# Patient Record
Sex: Male | Born: 1940 | Race: White | Hispanic: No | Marital: Married | Smoking: Current every day smoker
Health system: Southern US, Community
[De-identification: ages and names within clinical notes are randomized; demographics above are authoritative.]

## PROBLEM LIST (undated history)

## (undated) DIAGNOSIS — I10 Essential (primary) hypertension: Secondary | ICD-10-CM

## (undated) DIAGNOSIS — R972 Elevated prostate specific antigen [PSA]: Secondary | ICD-10-CM

## (undated) DIAGNOSIS — F32A Depression, unspecified: Secondary | ICD-10-CM

## (undated) DIAGNOSIS — G4730 Sleep apnea, unspecified: Secondary | ICD-10-CM

## (undated) DIAGNOSIS — Z87442 Personal history of urinary calculi: Secondary | ICD-10-CM

## (undated) DIAGNOSIS — K219 Gastro-esophageal reflux disease without esophagitis: Secondary | ICD-10-CM

## (undated) DIAGNOSIS — E119 Type 2 diabetes mellitus without complications: Secondary | ICD-10-CM

## (undated) DIAGNOSIS — F419 Anxiety disorder, unspecified: Secondary | ICD-10-CM

## (undated) DIAGNOSIS — F329 Major depressive disorder, single episode, unspecified: Secondary | ICD-10-CM

## (undated) DIAGNOSIS — C4490 Unspecified malignant neoplasm of skin, unspecified: Secondary | ICD-10-CM

## (undated) HISTORY — DX: Major depressive disorder, single episode, unspecified: F32.9

## (undated) HISTORY — PX: NECK SURGERY: SHX720

## (undated) HISTORY — DX: Depression, unspecified: F32.A

## (undated) HISTORY — PX: KNEE SURGERY: SHX244

## (undated) HISTORY — DX: Essential (primary) hypertension: I10

## (undated) HISTORY — PX: APPENDECTOMY: SHX54

## (undated) HISTORY — DX: Anxiety disorder, unspecified: F41.9

## (undated) HISTORY — DX: Sleep apnea, unspecified: G47.30

## (undated) HISTORY — DX: Type 2 diabetes mellitus without complications: E11.9

## (undated) HISTORY — PX: BACK SURGERY: SHX140

## (undated) HISTORY — PX: WRIST SURGERY: SHX841

## (undated) HISTORY — DX: Gastro-esophageal reflux disease without esophagitis: K21.9

## (undated) HISTORY — PX: SHOULDER SURGERY: SHX246

## (undated) HISTORY — DX: Elevated prostate specific antigen (PSA): R97.20

## (undated) HISTORY — DX: Personal history of urinary calculi: Z87.442

## (undated) HISTORY — PX: BURR HOLE FOR SUBDURAL HEMATOMA: SHX1275

## (undated) HISTORY — DX: Unspecified malignant neoplasm of skin, unspecified: C44.90

## (undated) HISTORY — PX: TONSILLECTOMY: SUR1361

---

## 2003-12-31 ENCOUNTER — Ambulatory Visit: Payer: Self-pay | Admitting: Unknown Physician Specialty

## 2003-12-31 ENCOUNTER — Other Ambulatory Visit: Payer: Self-pay

## 2004-01-06 ENCOUNTER — Ambulatory Visit: Payer: Self-pay | Admitting: Unknown Physician Specialty

## 2004-04-04 ENCOUNTER — Ambulatory Visit: Payer: Self-pay | Admitting: Physician Assistant

## 2004-07-19 ENCOUNTER — Emergency Department: Payer: Self-pay | Admitting: Emergency Medicine

## 2004-09-28 ENCOUNTER — Inpatient Hospital Stay: Payer: Self-pay | Admitting: Internal Medicine

## 2004-09-28 ENCOUNTER — Other Ambulatory Visit: Payer: Self-pay

## 2004-11-29 ENCOUNTER — Ambulatory Visit: Payer: Self-pay | Admitting: Internal Medicine

## 2004-12-06 ENCOUNTER — Ambulatory Visit: Payer: Self-pay | Admitting: Internal Medicine

## 2005-01-23 ENCOUNTER — Ambulatory Visit: Payer: Self-pay | Admitting: Internal Medicine

## 2006-01-23 ENCOUNTER — Ambulatory Visit: Payer: Self-pay | Admitting: Internal Medicine

## 2006-01-29 ENCOUNTER — Inpatient Hospital Stay: Payer: Self-pay | Admitting: Internal Medicine

## 2006-01-29 ENCOUNTER — Other Ambulatory Visit: Payer: Self-pay

## 2006-03-20 ENCOUNTER — Ambulatory Visit: Payer: Self-pay | Admitting: Gastroenterology

## 2006-03-22 ENCOUNTER — Ambulatory Visit: Payer: Self-pay | Admitting: Gastroenterology

## 2006-07-27 ENCOUNTER — Emergency Department: Payer: Self-pay | Admitting: Emergency Medicine

## 2006-08-11 ENCOUNTER — Ambulatory Visit: Payer: Self-pay | Admitting: Emergency Medicine

## 2007-05-03 ENCOUNTER — Ambulatory Visit: Payer: Self-pay | Admitting: Internal Medicine

## 2007-05-22 ENCOUNTER — Ambulatory Visit: Payer: Self-pay | Admitting: Family Medicine

## 2008-01-07 ENCOUNTER — Ambulatory Visit: Payer: Self-pay | Admitting: Internal Medicine

## 2008-04-06 ENCOUNTER — Ambulatory Visit: Payer: Self-pay | Admitting: Unknown Physician Specialty

## 2008-04-09 ENCOUNTER — Ambulatory Visit: Payer: Self-pay | Admitting: Unknown Physician Specialty

## 2008-04-10 ENCOUNTER — Ambulatory Visit: Payer: Self-pay | Admitting: Unknown Physician Specialty

## 2008-04-18 ENCOUNTER — Inpatient Hospital Stay: Payer: Self-pay | Admitting: Unknown Physician Specialty

## 2008-04-21 ENCOUNTER — Ambulatory Visit: Payer: Self-pay | Admitting: Unknown Physician Specialty

## 2008-05-07 ENCOUNTER — Ambulatory Visit: Payer: Self-pay | Admitting: Urology

## 2008-05-21 ENCOUNTER — Ambulatory Visit: Payer: Self-pay | Admitting: Unknown Physician Specialty

## 2008-08-03 ENCOUNTER — Ambulatory Visit: Payer: Self-pay | Admitting: Urology

## 2008-11-03 ENCOUNTER — Ambulatory Visit: Payer: Self-pay | Admitting: Urology

## 2008-11-10 ENCOUNTER — Ambulatory Visit: Payer: Self-pay | Admitting: Urology

## 2009-02-28 ENCOUNTER — Inpatient Hospital Stay: Payer: Self-pay | Admitting: Internal Medicine

## 2009-03-17 ENCOUNTER — Emergency Department: Payer: Self-pay | Admitting: Emergency Medicine

## 2009-05-03 ENCOUNTER — Ambulatory Visit: Payer: Self-pay | Admitting: Urology

## 2009-07-28 ENCOUNTER — Ambulatory Visit: Payer: Self-pay | Admitting: Unknown Physician Specialty

## 2009-08-24 ENCOUNTER — Ambulatory Visit: Payer: Self-pay | Admitting: Unknown Physician Specialty

## 2009-08-31 ENCOUNTER — Inpatient Hospital Stay: Payer: Self-pay | Admitting: Unknown Physician Specialty

## 2010-02-11 ENCOUNTER — Inpatient Hospital Stay: Payer: Self-pay | Admitting: Internal Medicine

## 2010-03-07 ENCOUNTER — Ambulatory Visit: Payer: Self-pay | Admitting: Internal Medicine

## 2010-06-27 ENCOUNTER — Ambulatory Visit: Payer: Self-pay | Admitting: Gastroenterology

## 2010-06-30 LAB — PATHOLOGY REPORT

## 2011-01-11 ENCOUNTER — Ambulatory Visit: Payer: Self-pay | Admitting: Surgery

## 2011-01-26 ENCOUNTER — Ambulatory Visit: Payer: Self-pay | Admitting: Unknown Physician Specialty

## 2011-02-02 ENCOUNTER — Ambulatory Visit: Payer: Self-pay | Admitting: Unknown Physician Specialty

## 2011-10-14 ENCOUNTER — Emergency Department: Payer: Self-pay | Admitting: Emergency Medicine

## 2011-10-14 LAB — CBC WITH DIFFERENTIAL/PLATELET
Eosinophil %: 1.4 %
Lymphocyte %: 33 %
Monocyte %: 8.5 %
Neutrophil %: 56.9 %
Platelet: 180 10*3/uL (ref 150–440)
RBC: 4.06 10*6/uL — ABNORMAL LOW (ref 4.40–5.90)
WBC: 10.6 10*3/uL (ref 3.8–10.6)

## 2011-10-14 LAB — BASIC METABOLIC PANEL
Anion Gap: 8 (ref 7–16)
Calcium, Total: 9.2 mg/dL (ref 8.5–10.1)
EGFR (African American): 43 — ABNORMAL LOW
EGFR (Non-African Amer.): 37 — ABNORMAL LOW
Glucose: 199 mg/dL — ABNORMAL HIGH (ref 65–99)
Osmolality: 281 (ref 275–301)
Sodium: 136 mmol/L (ref 136–145)

## 2012-01-22 ENCOUNTER — Emergency Department: Payer: Self-pay | Admitting: Emergency Medicine

## 2012-03-20 ENCOUNTER — Emergency Department: Payer: Self-pay | Admitting: Emergency Medicine

## 2012-07-23 ENCOUNTER — Emergency Department: Payer: Self-pay | Admitting: Internal Medicine

## 2012-07-23 LAB — CBC
HCT: 37.5 % — ABNORMAL LOW (ref 40.0–52.0)
HGB: 12.8 g/dL — ABNORMAL LOW (ref 13.0–18.0)
MCH: 30.4 pg (ref 26.0–34.0)
Platelet: 141 10*3/uL — ABNORMAL LOW (ref 150–440)
RBC: 4.2 10*6/uL — ABNORMAL LOW (ref 4.40–5.90)
WBC: 9.1 10*3/uL (ref 3.8–10.6)

## 2012-07-23 LAB — COMPREHENSIVE METABOLIC PANEL
Albumin: 3.7 g/dL (ref 3.4–5.0)
Alkaline Phosphatase: 66 U/L (ref 50–136)
Anion Gap: 7 (ref 7–16)
BUN: 18 mg/dL (ref 7–18)
Bilirubin,Total: 1 mg/dL (ref 0.2–1.0)
Calcium, Total: 8.8 mg/dL (ref 8.5–10.1)
Chloride: 101 mmol/L (ref 98–107)
EGFR (African American): >60
EGFR (Non-African Amer.): >60
Glucose: 133 mg/dL — ABNORMAL HIGH (ref 65–99)
Osmolality: 274 (ref 275–301)
Potassium: 4.5 mmol/L (ref 3.5–5.1)
SGPT (ALT): 27 U/L (ref 12–78)
Sodium: 135 mmol/L — ABNORMAL LOW (ref 136–145)
Total Protein: 6.9 g/dL (ref 6.4–8.2)

## 2012-09-01 DIAGNOSIS — R972 Elevated prostate specific antigen [PSA]: Secondary | ICD-10-CM | POA: Insufficient documentation

## 2012-09-01 DIAGNOSIS — N138 Other obstructive and reflux uropathy: Secondary | ICD-10-CM | POA: Insufficient documentation

## 2012-09-17 ENCOUNTER — Observation Stay: Payer: Self-pay | Admitting: Internal Medicine

## 2012-09-17 LAB — CK TOTAL AND CKMB (NOT AT ARMC)
CK, Total: 94 U/L (ref 35–232)
CK, Total: 130 U/L (ref 35–232)
CK-MB: 2.1 ng/mL (ref 0.5–3.6)
CK-MB: 3.4 ng/mL (ref 0.5–3.6)

## 2012-09-17 LAB — CBC
HCT: 38.2 % — ABNORMAL LOW (ref 40.0–52.0)
MCHC: 33.9 g/dL (ref 32.0–36.0)
MCV: 91 fL (ref 80–100)
Platelet: 163 10*3/uL (ref 150–440)
RBC: 4.21 10*6/uL — ABNORMAL LOW (ref 4.40–5.90)
RDW: 14.5 % (ref 11.5–14.5)
WBC: 11.8 10*3/uL — ABNORMAL HIGH (ref 3.8–10.6)

## 2012-09-17 LAB — BASIC METABOLIC PANEL
Anion Gap: 11 (ref 7–16)
Chloride: 100 mmol/L (ref 98–107)
Co2: 24 mmol/L (ref 21–32)
Creatinine: 1.35 mg/dL — ABNORMAL HIGH (ref 0.60–1.30)
EGFR (Non-African Amer.): 51 — ABNORMAL LOW
Sodium: 135 mmol/L — ABNORMAL LOW (ref 136–145)

## 2012-09-17 LAB — TROPONIN I: Troponin-I: <0.02 ng/mL

## 2013-03-16 ENCOUNTER — Ambulatory Visit: Payer: Self-pay | Admitting: Internal Medicine

## 2013-03-23 ENCOUNTER — Ambulatory Visit: Payer: Self-pay | Admitting: Family Medicine

## 2013-03-26 DIAGNOSIS — S065XAA Traumatic subdural hemorrhage with loss of consciousness status unknown, initial encounter: Secondary | ICD-10-CM | POA: Insufficient documentation

## 2013-03-26 DIAGNOSIS — S065X9A Traumatic subdural hemorrhage with loss of consciousness of unspecified duration, initial encounter: Secondary | ICD-10-CM | POA: Insufficient documentation

## 2013-07-24 DIAGNOSIS — K219 Gastro-esophageal reflux disease without esophagitis: Secondary | ICD-10-CM | POA: Insufficient documentation

## 2013-07-24 DIAGNOSIS — R5383 Other fatigue: Secondary | ICD-10-CM | POA: Insufficient documentation

## 2013-07-24 DIAGNOSIS — I2510 Atherosclerotic heart disease of native coronary artery without angina pectoris: Secondary | ICD-10-CM | POA: Insufficient documentation

## 2013-07-24 DIAGNOSIS — E782 Mixed hyperlipidemia: Secondary | ICD-10-CM | POA: Insufficient documentation

## 2013-08-13 DIAGNOSIS — N183 Chronic kidney disease, stage 3 unspecified: Secondary | ICD-10-CM | POA: Insufficient documentation

## 2013-08-13 DIAGNOSIS — E119 Type 2 diabetes mellitus without complications: Secondary | ICD-10-CM | POA: Insufficient documentation

## 2013-08-13 DIAGNOSIS — E538 Deficiency of other specified B group vitamins: Secondary | ICD-10-CM | POA: Insufficient documentation

## 2013-08-13 DIAGNOSIS — E162 Hypoglycemia, unspecified: Secondary | ICD-10-CM | POA: Insufficient documentation

## 2013-11-25 ENCOUNTER — Ambulatory Visit: Payer: Self-pay | Admitting: Family Medicine

## 2013-12-23 DIAGNOSIS — G250 Essential tremor: Secondary | ICD-10-CM | POA: Insufficient documentation

## 2014-01-18 ENCOUNTER — Ambulatory Visit: Payer: Self-pay | Admitting: Emergency Medicine

## 2014-01-18 ENCOUNTER — Emergency Department: Payer: Self-pay | Admitting: Emergency Medicine

## 2014-01-18 LAB — BASIC METABOLIC PANEL
ANION GAP: 12 (ref 7–16)
BUN: 22 mg/dL — AB (ref 7–18)
CHLORIDE: 100 mmol/L (ref 98–107)
CO2: 24 mmol/L (ref 21–32)
CREATININE: 1.6 mg/dL — AB (ref 0.60–1.30)
Calcium, Total: 8.6 mg/dL (ref 8.5–10.1)
EGFR (Non-African Amer.): 45 — ABNORMAL LOW
GFR CALC AF AMER: 55 — AB
GLUCOSE: 213 mg/dL — AB (ref 65–99)
Osmolality: 282 (ref 275–301)
Potassium: 4.1 mmol/L (ref 3.5–5.1)
Sodium: 136 mmol/L (ref 136–145)

## 2014-01-18 LAB — CBC
HCT: 37.1 % — AB (ref 40.0–52.0)
HGB: 12 g/dL — AB (ref 13.0–18.0)
MCH: 29.9 pg (ref 26.0–34.0)
MCHC: 32.4 g/dL (ref 32.0–36.0)
MCV: 92 fL (ref 80–100)
Platelet: 178 10*3/uL (ref 150–440)
RBC: 4.02 10*6/uL — AB (ref 4.40–5.90)
RDW: 16.7 % — AB (ref 11.5–14.5)
WBC: 8 10*3/uL (ref 3.8–10.6)

## 2014-03-06 ENCOUNTER — Observation Stay: Payer: Self-pay | Admitting: Internal Medicine

## 2014-03-06 LAB — TROPONIN I
TROPONIN-I: 0.03 ng/mL
Troponin-I: 0.02 ng/mL

## 2014-03-06 LAB — COMPREHENSIVE METABOLIC PANEL
ALBUMIN: 3.3 g/dL — AB (ref 3.4–5.0)
ALK PHOS: 66 U/L
Anion Gap: 8 (ref 7–16)
BUN: 18 mg/dL (ref 7–18)
Bilirubin,Total: 0.4 mg/dL (ref 0.2–1.0)
CO2: 29 mmol/L (ref 21–32)
Calcium, Total: 8.8 mg/dL (ref 8.5–10.1)
Chloride: 104 mmol/L (ref 98–107)
Creatinine: 1.42 mg/dL — ABNORMAL HIGH (ref 0.60–1.30)
EGFR (African American): >60
EGFR (Non-African Amer.): 52 — ABNORMAL LOW
GLUCOSE: 69 mg/dL (ref 65–99)
OSMOLALITY: 282 (ref 275–301)
Potassium: 3.8 mmol/L (ref 3.5–5.1)
SGOT(AST): 24 U/L (ref 15–37)
SGPT (ALT): 29 U/L
SODIUM: 141 mmol/L (ref 136–145)
TOTAL PROTEIN: 6.7 g/dL (ref 6.4–8.2)

## 2014-03-06 LAB — CBC
HCT: 38.8 % — ABNORMAL LOW (ref 40.0–52.0)
HGB: 12.7 g/dL — ABNORMAL LOW (ref 13.0–18.0)
MCH: 29.7 pg (ref 26.0–34.0)
MCHC: 32.7 g/dL (ref 32.0–36.0)
MCV: 91 fL (ref 80–100)
PLATELETS: 152 10*3/uL (ref 150–440)
RBC: 4.26 10*6/uL — AB (ref 4.40–5.90)
RDW: 16.6 % — AB (ref 11.5–14.5)
WBC: 8.9 10*3/uL (ref 3.8–10.6)

## 2014-03-06 LAB — CK TOTAL AND CKMB (NOT AT ARMC)
CK, Total: 114 U/L (ref 39–308)
CK-MB: 2.1 ng/mL (ref 0.5–3.6)

## 2014-03-06 LAB — LIPID PANEL
Cholesterol: 150 mg/dL (ref 0–200)
HDL Cholesterol: 41 mg/dL (ref 40–60)
LDL CHOLESTEROL, CALC: 87 mg/dL (ref 0–100)
Triglycerides: 110 mg/dL (ref 0–200)
VLDL Cholesterol, Calc: 22 mg/dL (ref 5–40)

## 2014-03-07 LAB — CK TOTAL AND CKMB (NOT AT ARMC)
CK, Total: 139 U/L (ref 39–308)
CK-MB: 2.2 ng/mL (ref 0.5–3.6)

## 2014-03-07 LAB — TROPONIN I: Troponin-I: 0.06 ng/mL — ABNORMAL HIGH

## 2014-03-17 DIAGNOSIS — K224 Dyskinesia of esophagus: Secondary | ICD-10-CM | POA: Insufficient documentation

## 2014-04-14 ENCOUNTER — Ambulatory Visit: Payer: Self-pay | Admitting: Family Medicine

## 2014-04-23 DIAGNOSIS — R159 Full incontinence of feces: Secondary | ICD-10-CM | POA: Insufficient documentation

## 2014-04-23 DIAGNOSIS — N3941 Urge incontinence: Secondary | ICD-10-CM | POA: Insufficient documentation

## 2014-04-23 DIAGNOSIS — M545 Low back pain, unspecified: Secondary | ICD-10-CM | POA: Insufficient documentation

## 2014-05-07 ENCOUNTER — Ambulatory Visit: Payer: Self-pay | Admitting: Infectious Diseases

## 2014-06-05 NOTE — Consult Note (Signed)
Brief Consult Note: Diagnosis: CP, atypical, neg trop, ECG unremakable.   Patient was seen by consultant.   Consult note dictated.   Comments: REC  Agree with current therapy, defer full dose anticoagulation, may dc home for out patient w/u per Dr Nehemiah Massed.  Electronic Signatures: Isaias Cowman (MD)  (Signed 05-Aug-14 12:38)  Authored: Brief Consult Note   Last Updated: 05-Aug-14 12:38 by Isaias Cowman (MD)

## 2014-06-05 NOTE — Discharge Summary (Signed)
PATIENT NAME:  Richard Chapman, Richard Chapman MR#:  [id number] DATE OF BIRTH:  [birth year]  DATE OF ADMISSION:  09/17/2012 DATE OF DISCHARGE:  09/17/2012  DISCHARGE DIAGNOSES: 1.  Atypical chest pain.  2.  Hypertension.  3.  Hyperlipidemia.  4.  Diabetes mellitus, insulin-requiring.   DISCHARGE MEDICATIONS: Aspirin 81 mg daily, gabapentin 300 mg t.i.d., Lexapro 20 mg b.i.d., metformin 1000 mg b.i.d., Lipitor 20 mg at bedtime, Diovan/hydrochlorothiazide  160/12.5 daily, Nexium 20 mg b.i.d., Lasix 40 mg daily, Xanax 0.5 mg t.i.d. p.r.n.  REASON FOR ADMISSION: A 74 year old male presents with atypical chest pain. Please see H and P for HPI, past medical history, and physical exam.   HOSPITAL COURSE: The patient was admitted, ruled out for a MI. He was planned for a stress test but had eaten that morning, but because he was up and around, asymptomatic, without chest pain, and likely due to reflux after drinking a number of beers and lying down that prior evening he was discharged to follow up with Dr. Saralyn Pilar for outpatient stress testing. If he had any further chest pain, he is to contact Dr. Saralyn Pilar.     ____________________________ Rusty Aus, MD mfm:dm D: 09/18/2012 08:12:38 ET T: 09/18/2012 09:09:19 ET JOB#: [id number]  cc: Rusty Aus, MD, <Dictator> MARK Roselee Culver MD ELECTRONICALLY SIGNED 09/19/2012 8:21

## 2014-06-05 NOTE — Consult Note (Signed)
PATIENT NAME:  Richard Chapman, Richard Chapman MR#:  [id number] DATE OF BIRTH:  28-Oct-1940  DATE OF CONSULTATION:  09/17/2012  CONSULTING PHYSICIAN:  Isaias Cowman, MD  PRIMARY CARE PHYSICIAN: Cheral Marker. Ola Spurr, MD  CARDIOLOGIST: Corey Skains, MD   CHIEF COMPLAINT: Chest pain.   REASON FOR CONSULTATION: Consultation requested for evaluation of chest pain.   HISTORY OF PRESENT ILLNESS: The patient is a 74 year old gentleman with known noncritical 3-vessel coronary artery disease by cardiac catheterization in October 2006. The patient apparently was in his usual state of health until last evening. The patient apparently drank several beers and developed substernal chest discomfort. He presented to [facility] Emergency Room. While in the Emergency Room, the patient received intravenous morphine for chest pain, became delirious, was then noted to be hypoglycemic, became agitated and was treated with intravenous Haldol. The patient had an uncomplicated hospital course. He was scheduled for ETT sestamibi study this a.m.; however, he drank coffee, and the test was canceled.   PAST MEDICAL HISTORY:  1. Known noncritical 3-vessel coronary artery disease.  2. Hypertension.  3. Hyperlipidemia.  4. Diabetes.   MEDICATIONS:  1. Aspirin 81 mg daily.  2. Lipitor 20 mg at bedtime.  3. Diovan/HCTZ 160/12.5 mg 1 daily.  4. Furosemide 40 mg daily.  5. Gabapentin 300 mg t.i.d. 6. Lexapro 20 mg b.i.d. 7. Metformin 1000 mg b.i.d.  8. Xanax 0.25 mg t.i.d. p.r.n.  9. Nexium 20 mg daily.   SOCIAL HISTORY: The patient is married. He lives with his wife. He drinks alcohol occasionally. The patient did apparently have 2 beers prior to presenting to [facility] Emergency Room.   FAMILY HISTORY: Father died status post MI at age 34. Mother died status post MI at an early age.   REVIEW OF SYSTEMS:  CONSTITUTIONAL: No fever or chills.  EYES: No blurry vision.  EARS: No hearing loss.  RESPIRATORY: No shortness of breath.   CARDIOVASCULAR: Chest discomfort as described above.  GASTROINTESTINAL: No nausea, vomiting, diarrhea or constipation.  GENITOURINARY: No dysuria or hematuria.  ENDOCRINE: No polyuria or polydipsia.  MUSCULOSKELETAL: No arthralgias or myalgias.  NEUROLOGICAL: No focal muscle weakness or numbness.  PSYCHOLOGICAL: No depression or anxiety.   PHYSICAL EXAMINATION:  VITAL SIGNS: Blood pressure 156/71, pulse 69, respirations 18, temperature 97.8, pulse oximetry 96%.  HEENT: Pupils equally reactive to light and accommodation.  NECK: Supple without thyromegaly.  LUNGS: Clear.  CARDIOVASCULAR: Normal JVP. Normal PMI. Regular rate and rhythm. Normal S1, S2. No appreciable gallop, murmur or rub.  ABDOMEN: Soft and nontender. Pulses were intact bilaterally.  MUSCULOSKELETAL: Normal muscle tone.  NEUROLOGICAL: The patient is alert and oriented x3. Motor and sensory both grossly intact.   IMPRESSION: A 74 year old gentleman with known noncritical 3-vessel coronary artery disease who has been treated medically, who presents with chest pain with typical and atypical features. Has ruled out for myocardial infarction by CPK isoenzymes and troponin. The patient was scheduled for functional study this a.m.; however, this was canceled due to the fact that the patient drank coffee. The patient currently is clinically hemodynamically stable.   RECOMMENDATIONS:  1. Agree with overall current therapy.  2. Would defer full-dose anticoagulation.  3. Since the patient has ruled out for myocardial infarction with the absence of EKG changes, it would be reasonable to discharge the patient home to continue cardiac workup as an outpatient per Dr. Nehemiah Massed.   ____________________________ Isaias Cowman, MD ap:OSi D: 09/17/2012 12:37:10 ET T: 09/17/2012 13:01:12 ET JOB#: [id number]  cc: Isaias Cowman,  MD, <Dictator> Isaias Cowman MD ELECTRONICALLY SIGNED 10/09/2012 15:59

## 2014-06-05 NOTE — H&P (Signed)
PATIENT NAME:  Richard Chapman, Richard Chapman MR#:  [id number] DATE OF BIRTH:  1940/08/18  DATE OF ADMISSION:  09/17/2012  PRIMARY CARE PHYSICIAN: Dr. Ola Spurr. The patient used to follow with Dr. Apolonio Schneiders but recently switched to Dr. Ola Spurr.   PRIMARY CARDIOLOGIST: Dr. Nehemiah Massed.   REFERRING PHYSICIAN: Dr. Lurline Hare.   CHIEF COMPLAINT: Chest pain.   HISTORY OF PRESENT ILLNESS: This is a 74 year old male with significant past medical history of coronary artery disease with noncritical 3-vessel atherosclerosis by cardiac cath in October 2006, diabetes mellitus, hyperlipidemia, hypertension. The patient had recent cardiac cath by Dr. Nehemiah Massed in March of this year showing ejection fraction of 60%. The patient presents with complaints of chest pain, midsternal, radiating to the neck. Described it as pressure and tightness, waxes and wanes. Was still present in the ED. The patient had negative troponin. Had no EKG changes but was complaining of some nausea but no vomiting. No shortness of breath. No palpitation. Had some weakness and dizziness as well. He received some sublingual nitroglycerin which did help the chest pain, but it did not totally resolve. The patient already received 324 of aspirin by EMS. In the ED, out of concern of unstable angina, he received 1 dose of subcutaneous Lovenox 1 mg/kg. The patient received IV morphine for his chest pain, where he was noticed to be delirious after that, where he was agitated, where he required IV Haldol. The patient was noticed to have right thigh insulin pump inside which was discontinued. The patient was noticed to be diaphoretic, where his fingersticks where checked where he was noticed to have fingersticks of 33, where he received 1 amp of D50. By the time of my examination, the patient was already agitated and received the IV Haldol, so he cannot give any reliable history, where it was obtained from ED staff and his wife.   PAST MEDICAL HISTORY:  1. Coronary  artery disease.  2. Hypertension.  3. Hyperlipidemia.  4. Diabetes mellitus.  5. Arthritis.   ALLERGIES: VIBRAMYCIN AND BUSPAR.   FAMILY HISTORY: Father died of MI at the age of 66. Mother had an MI at early age.    SOCIAL HISTORY: Remote history of smoking. Occasional alcohol use. The patient lives at home with his wife. Drinks alcohol occasionally. Wife reports the patient drank 2 beers tonight prior to coming to ED but reports he does drink occasionally.   SURGICAL HISTORY:  1. Appendectomy.  2. Rotator cuff repair.  3. Arthroscopic bilateral knee surgery.  4. Lumbar laminectomy.  5. Left carpal tunnel release.  6. Nasal polyp excision.   7. Skin cancer removed from the dorsum of the right hand by dermatology.   HOME MEDICATIONS:  1. Aspirin 81 mg oral daily.  2. Sublingual nitroglycerin as needed.  3. Gabapentin 300 mg oral 3 times a day.  4. Lexapro 20 mg oral 2 times a day.  5. Metformin 1000 mg oral 2 times a day.  6. Lipitor 20 mg oral at bedtime.  7. Diovan/hydrochlorothiazide 160/12.5 one tablet p.o. daily.  8. Xanax 0.25 mg oral 3 times a day as needed.  9. Lasix 40 mg oral daily.  10. Nexium 20 mg oral daily.  11. The patient as well noticed to have insulin pump.   REVIEW OF SYSTEMS: At the time of my examination, the patient had already been agitated, had received IV Haldol and is sleeping. Not able to give any reliable review of systems.   PHYSICAL EXAMINATION:  VITAL SIGNS: Temperature 98.4,  pulse 55, respiratory rate 18, blood pressure 229/95, saturating 100% on oxygen.  GENERAL: Well-nourished male, sleeping comfortably, in no apparent distress.  HEENT: Head atraumatic, normocephalic. Pupils equal, reactive to light. Pink conjunctivae. Anicteric sclerae. Moist oral mucosa.  NECK: Supple. No thyromegaly. No JVD.  CHEST: Good air entry bilaterally. No wheezing, rales, rhonchi.  CARDIOVASCULAR: S1, S2 heard. No rubs, murmurs, gallops.  ABDOMEN: Soft,  nontender, nondistended. Bowel sounds present.  EXTREMITIES: No edema. No clubbing. No cyanosis. The patient was noticed to have right upper thigh insulin pump which was discontinued.  PSYCHIATRIC: The patient is currently sleeping comfortably. Prior to that was agitated and confused.  NEUROLOGIC: Unable to evaluate appropriately secondary to his mental status, but he appears to be moving all extremities without significant deficits.  LYMPHATICS: No cervical or supraclavicular lymphadenopathy.  SKIN: Warm and dry.   PERTINENT LABS: Glucose 86, BUN 18, creatinine 1.35, sodium 135, potassium 3.4, chloride 100, CO2 24. Anion gap 11. ALT 27, AST 34, alk phos 66. Troponin less than 0.02. White blood cells 11.8, hemoglobin 12.9, hematocrit 38.2, platelets 163.   EKG showing normal sinus rhythm without significant ST or T wave changes from previous EKG, with 60 beats per minute.   ASSESSMENT AND PLAN:  1. Chest pain: Currently appears to be resolved. The patient will be admitted to telemetry unit and will continue to cycle his troponin. Already received aspirin and subcutaneous Lovenox 1 mg/kg out of concern of unstable angina. Will start the patient on nitro paste. He is already on statin, aspirin and Diovan. Will hold on starting beta blocker due to history of causing significant bradycardia. Will consult cardiology, Dr. Nehemiah Massed, who is the patient's primary cardiologist. The patient had recent echo in March 2014 with ejection fraction 60%, so at this point will hold on continuing Lovenox ordered until he is seen and evaluated by cardiology. Will follow their recommendations regarding anticoagulation or if he is ruled in for myocardial infarction.  2. Delirium: This appears to be multifactorial, mainly due to hypoglycemia and due to morphine as well. Much improved on Haldol. Will have him on sitter if needed.  3. Hypoglycemia: This is most likely because he has been on the insulin pump without eating.  Currently discontinued. Will monitor closely fingersticks, and if needed, will have him on p.r.n. D50 pushes.  4. Diabetes mellitus: His insulin pump has been discontinued. Will hold his metformin. Will have him on insulin sliding scale.  5. Hyperlipidemia: Continue with statin.  6. Hypertension, uncontrolled: This was most likely due to agitation. Will resume his home medications. Will add p.r.n. intravenous hydralazine.  7. Deep vein thrombosis prophylaxis: The patient received full-dose anticoagulation at this point, and the decision will be made after he is seen by cardiology whether to continue full treatment dose or prophylaxis dose of anticoagulation.  8. Gastrointestinal prophylaxis: The patient is on proton pump inhibitor.   CODE STATUS: FULL CODE.   TOTAL TIME SPENT ON ADMISSION AND PATIENT CARE: 55 minutes.   ____________________________ Albertine Patricia, MD dse:gb D: 09/17/2012 04:32:40 ET T: 09/17/2012 05:45:23 ET JOB#: [id number]  cc: Albertine Patricia, MD, <Dictator> DAWOOD Graciela Husbands MD ELECTRONICALLY SIGNED 09/17/2012 7:11

## 2014-06-07 NOTE — Op Note (Signed)
PATIENT NAME:  Richard Chapman, Richard Chapman MR#:  [id number] DATE OF BIRTH:  [birth year]  DATE OF PROCEDURE:  02/02/2011  PREOPERATIVE DIAGNOSES:  1. Rotator cuff tear, left shoulder.  2. AC joint arthritis. 3. Impingement syndrome, left shoulder.   POSTOPERATIVE DIAGNOSES:  1. Rotator cuff tear, left shoulder.  2. AC joint arthritis. 3. Impingement syndrome, left shoulder. 4. Labral tear. 5. Glenohumeral synovitis.   PROCEDURES:  1. Arthroscopic rotator cuff repair, left shoulder.  2. Arthroscopic subacromial decompression and resection of coracoacromial ligament, left shoulder.  3. Arthroscopic subacromial decompression, left shoulder.  4. Arthroscopic debridement of labral tear and limited synovectomy of glenohumeral joint.   SURGEON: Alysia Penna. Teniyah Seivert, MD    ASSISTANT: None.   ANESTHESIA: General.   ESTIMATED BLOOD LOSS: Minimal.   COMPLICATIONS: None.   IMPLANTS USED: Two speed screws.   BRIEF CLINICAL NOTE AND PATHOLOGY: The patient had persistent shoulder pain unresponsive to conservative treatment. Work-up showed evidence of rotator cuff tear with impingement and AC joint arthritis. At the time of the procedure, there were mild glenohumeral arthritic changes. There was significant synovitis and a degenerative labral tear. There was no evidence of instability of the shoulder. There was marked impingement. The rotator cuff tear repaired nicely to its original insertion.   DESCRIPTION OF PROCEDURE: Preop antibiotics, adequate general anesthesia, beach chair position, routine prepping and draping. Appropriate time-out was called.   The shoulder was injected with Marcaine and epinephrine. Arthroscope was introduced through a routine posterior portal. Anterior portal was made with the guidance of a spinal needle. The shoulder was inspected with the above-mentioned pathology found.   Arthroscopic debridement the glenohumeral joint was performed. Labral tear was debrided. The undersurface of the  rotator cuff was examined. The scope was then passed into the subacromial space. The bursa was removed through the lateral portal. Subacromial decompression was performed using the en bloc technique through the lateral portal. The coracoacromial ligament was released.   The distal clavicle was then resected from inferior to superior from the lateral and anterior portals. The dorsal ligaments were left intact.   The edges of the cuff were then examined. Two sutures were placed. An area of bone was denuded down to bleeding bone just medial to the original insertion. Two anchors were placed and the rotator cuff repaired nicely to its original insertion. The shoulder was taken through a range of motion with no evidence of impingement. The portals were closed with nylon. Soft sterile dressing was applied. The patient was awakened and taken to the PAC-U having tolerated the procedure well.   ____________________________ Alysia Penna. Mauri Pole, MD jcc:drc D: 02/03/2011 07:18:32 ET T: 02/03/2011 10:51:29 ET JOB#: [id number]  cc: Alysia Penna. Mauri Pole, MD, <Dictator> Alysia Penna Jaylyn Iyer MD ELECTRONICALLY SIGNED 03/02/2011 8:14

## 2014-06-14 NOTE — H&P (Signed)
PATIENT NAME:  Richard Chapman, Richard Chapman MR#:  [id number] DATE OF BIRTH:  [birth year]  DATE OF ADMISSION:  03/06/2014  PRIMARY CARE PHYSICIAN:  Adrian Prows, MD.    PRIMARY CARDIOLOGIST:  Serafina Royals, MD.    CHIEF COMPLAINT: Chest pain today.   HISTORY OF PRESENT ILLNESS: Richard Chapman is a pleasant 74 year old obese Caucasian gentleman with past medical history of esophageal vasospasm, hypertension, type 2 diabetes on insulin pump, comes to the Emergency Room complaining of mid substernal chest pain which got somewhat relieved with nitroglycerin paste in the Emergency Room. He also has history of esophageal spasm, the patient says it is difficult for him to differentiate between his spasm pain and the current chest pain. He has had cardiac catheterization in the past, according to the patient did not have major blockages, he does not remember exactly when it was done. First set of cardiac enzymes are negative. His EKG does not show acute ST elevation or depression. He is being admitted for further evaluation and management.   PAST MEDICAL HISTORY:  1. Type 2 diabetes on insulin pump.  2. Hyperlipidemia.  3. History of esophageal spasm.  4. DJD.   5. Hypertension.  6. Cardiac catheterization in the past essentially was negative according to the patient.   ALLERGIES: BUSPAR AND VIBRAMYCIN.    SOCIAL HISTORY: Lives at home. Nonsmoker, nonalcoholic.   FAMILY HISTORY: Father died of MI at age 30, mother had MI at early age.   PAST SURGICAL HISTORY:  1. Appendectomy.  2. Rotator cuff repair.  3. Arthroscopic bilateral knee surgery.   4. Lumbar laminectomy.  5. Left carpal tunnel release.  6. Nasal polyp excision.  7. Skin cancer removed from the dorsum of the right hand by dermatology.    REVIEW OF SYSTEMS:  CONSTITUTIONAL: No fever, fatigue, weakness.  EYES: No blurred or double vision, glaucoma, or cataracts.  EARS, NOSE, AND THROAT: No tinnitus, ear pain, hearing loss.  RESPIRATORY: No  cough, wheeze, hemoptysis. No respiratory distress.  CARDIOVASCULAR: Positive for chest pain.  No COPD or hemoptysis.  GASTROINTESTINAL: No nausea, vomiting, diarrhea, or abdominal pain. Positive for GERD.  GENITOURINARY: No dysuria, hematuria, frequency.  ENDOCRINE: No polyuria, nocturia, or thyroid problems.  HEMATOLOGY: No anemia or easy bruising or bleeding.  SKIN: No acne or rash.  MUSCULOSKELETAL: Positive for arthritis and DJD.  No swelling or gout.  NEUROLOGIC: No CVA, TIA, ataxia, or vertigo.  PSYCHIATRIC: No anxiety, depression, or bipolar disorder.   All other systems reviewed and negative.   PHYSICAL EXAMINATION:  GENERAL: The patient is awake, alert, oriented x 3, not in acute distress.  VITAL SIGNS: Afebrile, pulse is 69, blood pressure is 190/74, saturations are 98% on room air.  HEENT: Atraumatic, normocephalic. Pupils PERRLA.  EOM intact. Oral mucosa is moist.  NECK: Supple. No JVD. No carotid bruit.  LUNGS: Clear to auscultation bilaterally. No rales, rhonchi, respiratory distress, or labored breathing.  CARDIOVASCULAR: Both the heart sounds are normal. Rate and rhythm are regular. PMI not lateralized. Chest nontender.  EXTREMITIES: Good pedal pulses, good femoral pulses. No lower extremity edema. ABDOMEN: Obese, soft, nontender. No organomegaly.  SKIN: Warm and dry.  NEUROLOGIC: Grossly intact cranial nerves II through XII. No motor or sensory deficit.  PSYCHIATRIC: The patient is awake, alert, oriented x 3.  SKIN: Warm and dry.   LABORATORY DATA:   1.  Chest x-ray, no active disease.  2.  Glucose of 69, BUN is 18, creatinine is 1.42, sodium is 141, potassium is  3.8, chloride is 104, bicarbonate is 29, calcium is 8.8. Bilirubin is 0.4. SGPT is 29, SGOT is 24.  H and H are 12.7 and 38.8, platelet count is 152,000. EKG shows junctional rhythm with no acute ST elevation or depression.   ASSESSMENT: A 74 year old, Richard Chapman, with history of hypertension, hyperlipidemia,  and diabetes, comes in with:   1.  Chest pain. The patient will be admitted on telemetry floor. We will keep him on telemetry. Cardiac enzymes x 3. Continue nitroglycerin and enteric coated aspirin along with statins and his cardiac medications which are hydrochlorothiazide and Diovan. We will cycle cardiac enzymes x 3. Check lipid profile. The patient has had cardiac catheterization in the remote past, which according to him has been negative. We will have cardiology see the patient in consultation.  2.  History of esophageal spasm. The patient's chest pain could be very well due to esophageal spasm, however given his risk factors we will rule out MI/unstable angina. We will continue Nitro-Bid ointment and continue PPI b.i.d.  3.  Hypertension, uncontrolled. Resume home medications and give p.r.n. hydralazine along with Nitro-Bid ointment.  4.  Type 2 diabetes. Continue sliding scale insulin for now. We will hold off on the patient's insulin pump since he does not have the original pump and due to poor weather condition no family is able to come over to the hospital to give him the insulin pump 5.  Deep vein thrombosis prophylaxis. Subcutaneous heparin t.i.d.   The above was discussed with the patient. The patient is a full code.   TIME SPENT: 45 minutes.     ____________________________ Hart Rochester Posey Pronto, MD sap:bu D: 03/06/2014 17:15:31 ET T: 03/06/2014 17:47:23 ET JOB#: [id number]  cc: Tal Neer A. Posey Pronto, MD, <Dictator> Cheral Marker. Ola Spurr, MD Ilda Basset MD ELECTRONICALLY SIGNED 03/10/2014 14:19

## 2014-06-14 NOTE — Discharge Summary (Signed)
PATIENT NAME:  Richard Chapman, Richard Chapman MR#:  [id number] DATE OF BIRTH:  07/05/1940  DATE OF ADMISSION:  03/06/2014 DATE OF DISCHARGE:  03/08/2014  PRIMARY CARE PHYSICIAN Cheral Marker. Ola Spurr, MD   FINAL DIAGNOSES:  1.  Accelerated hypertension.  2.  Chest pain, likely esophageal spasm, borderline troponin.  3.  Diabetes type 2, without complication.  4.  Hyperlipidemia.  5.  Gastroesophageal reflux disease.   MEDICATIONS ON DISCHARGE: Include his usual Nexium 40 mg twice a day, nitroglycerin 0.4 mg sublingually every 5 minutes as needed for chest pain, V-Go insulin pump subcutaneous continuous via pump, gabapentin 100 mg at bedtime, aspirin 81 mg daily, Diovan HCT 160/12.5 mg 1 tablet in the morning, furosemide 40 mg daily, Lexapro 20 mg twice a day, atorvastatin 20 mg at bedtime, metoprolol extended release 25 mg daily, amlodipine 5 mg daily, Imdur 60 mg daily.   DIET: Low-sodium, carbohydrate-controlled diet, regular consistency.   FOLLOWUP: With either Dr. Ola Spurr or Dr. Nehemiah Massed this week coming up to check blood  pressure and the other one the week after.   HISTORY OF PRESENT ILLNESS: The patient was admitted March 06, 2014, and discharged March 08, 2014. The patient came in with chest pain, was admitted as an observation, started on telemetry monitoring, and cardiac enzymes were ordered. Given aspirin and cardiology consultation was ordered.   LABORATORY AND RADIOLOGICAL DATA DURING THE HOSPITAL COURSE: Included an LDL of 87, HDL 41, triglycerides 110. Troponin 0.02. White blood cell count 8.9, H and H 12.7 and 38.8, platelet count of 152,000. Glucose 69, BUN 18, creatinine 1.42, sodium 141, potassium 3.8, chloride 104, CO2 of 29, calcium 8.8. Liver function tests: Normal range. Chest x-ray negative. Next troponin borderline at 0.03, next troponin borderline at 0.06.    The patient was seen in consultation by Dr. Saralyn Pilar, who did not recommend any further cardiac testing during the  hospitalization and follow up as outpatient.   HOSPITAL COURSE PER PROBLEM LIST:  1.  For the patient's accelerated hypertension, 3 new medications were added, low-dose metoprolol, Imdur, and amlodipine. Blood pressure medications take about 5 days to get a steady state in and his blood pressure was borderline upon discharge, 159/77. Continue to monitor closely as outpatient. The patient did have accelerated hypertension with blood pressure as high as 211/70 during the hospital course.  2.  Chest pain, likely esophageal spasm, borderline troponin. The patient was seen in consultation by Dr. Saralyn Pilar; did not recommend any further testing. The patient had a stress test last year as per the patient and cardiac catheterizations in the past. Last cardiac catheterization was done a while back. Follow up as outpatient with regards to cardiac symptoms. I spoke with Dr. Saralyn Pilar. No further testing in-house. Follow up as outpatient. This could be esophageal spasm. The Imdur and the amlodipine should help out with that.  3.  Diabetes without complication. He is on his insulin pump.  4.  Hyperlipidemia. Cholesterol profile acceptable, on atorvastatin.  5.  Gastroesophageal reflux disease without esophagitis. The patient is on his usual Nexium as outpatient. He was on Protonix while here.   TIME SPENT ON DISCHARGE: 35 minutes.     ____________________________ Tana Conch. Leslye Peer, MD rjw:ts D: 03/08/2014 14:24:20 ET T: 03/08/2014 21:07:01 ET JOB#: [id number]  cc: Tana Conch. Leslye Peer, MD, <Dictator> Cheral Marker. Ola Spurr, MD Corey Skains, MD  Marisue Brooklyn MD ELECTRONICALLY SIGNED 03/13/2014 14:41

## 2014-06-14 NOTE — Consult Note (Signed)
PATIENT NAME:  Richard Chapman, Richard Chapman MR#:  [id number] DATE OF BIRTH:  25-May-1940  DATE OF CONSULTATION:  03/07/2014  REFERRING PHYSICIAN:   CONSULTING PHYSICIAN:  Isaias Cowman, MD  CARDIOLOGY CONSULTATION  PRIMARY CARE PHYSICIAN: Cheral Marker. Ola Spurr, MD   CARDIOLOGIST: Corey Skains, MD   CHIEF COMPLAINT: Chest pain.   REASON FOR CONSULTATION: Consultation requested for evaluation of chest pain.   HISTORY OF PRESENT ILLNESS: The patient is a 74 year old gentleman with a history of  hyperlipidemia, hypertension, and Type 2 diabetes, referred for evaluation of chest discomfort. The patient apparently has had 3 previous cardiac catheterizations showing insignificant coronary artery disease. The patient reports that he has been in his usual state of health until the day of admission, when he experienced substernal chest discomfort. Symptoms were described as squeezing sensation associated with nausea. Symptoms were similar to what he experienced in the past with esophageal spasm. EKG was nondiagnostic. Initial troponin was negative. Second troponin was 0.06. The patient denies chest pain today.   PAST MEDICAL HISTORY:  1.  Previous cardiac catheterization, which revealed insignificant coronary artery disease.  2.  Hyperlipidemia.  3.  Hypertension.  4.  Diabetes.  5.  History esophageal spasm.   MEDICATIONS: Furosemide 40 mg daily, Diovan HCT 160/12.5 mg daily, atorvastatin 20 mg daily, aspirin 81 mg daily, insulin pump, gabapentin 100 mg daily,  esomeprazole 40 mg b.i.d., escitalopram 20 mg b.i.d.   SOCIAL HISTORY: The patient is married and resides with his wife. He smokes less than 1/2 pack of cigarettes per day.   FAMILY HISTORY: Father died status post MI at age 65. Mother died status post MI.  REVIEW OF SYSTEMS:  CONSTITUTIONAL: No fever or chills.  EYES: No blurry vision.  EARS: No hearing loss.  RESPIRATORY: No shortness of breath.  CARDIOVASCULAR: Chest pain, as described  above.  GASTROINTESTINAL: The patient did have nausea.  GENITOURINARY: No dysuria or hematuria.  ENDOCRINE: No polyuria or polydipsia.  MUSCULOSKELETAL: No arthralgias or myalgias.  INTEGUMENTARY: No rash.  NEUROLOGICAL: No focal muscle weakness or numbness.  PSYCHOLOGICAL: No depression or anxiety.   PHYSICAL EXAMINATION:  VITAL SIGNS: Blood pressure 200/76, pulse 97, pulse oximetry 65, respirations 20, pulse oximetry 94%.  HEENT: Pupils equal and reactive to light and accommodation.  NECK: Supple, without thyromegaly.  LUNGS: Clear.  HEART: Normal JVP. Normal PMI. Regular rate and rhythm. Normal S1, S2. No appreciable gallop, murmur, or rub.  ABDOMEN: Soft and nontender. Pulses were intact bilaterally.  MUSCULOSKELETAL: Normal muscle tone.  NEUROLOGIC: The patient is alert and oriented x 3. Motor and sensory both grossly intact.   IMPRESSION: A 74 year old gentleman who presents with chest pain with typical and atypical features, nondiagnostic EKG, initial negative troponin, currently chest pain free. Second troponin was borderline elevated.   RECOMMENDATIONS:  1.  Agree with overall current therapy.  2.  If the third troponin is negative and the patient remains chest pain free, would consider discharge home.  3.  Change nitro paste to a long-acting nitrate, Imdur 30 mg daily.  4.  Add low-dose metoprolol for better blood pressure control.  5.  Further recommendations pending the patient's initial clinical course.    ____________________________ Isaias Cowman, MD ap:MT D: 03/07/2014 09:12:55 ET T: 03/07/2014 09:37:47 ET JOB#: [id number]  cc: Isaias Cowman, MD, <Dictator> Isaias Cowman MD ELECTRONICALLY SIGNED 04/07/2014 14:35

## 2014-08-04 DIAGNOSIS — I10 Essential (primary) hypertension: Secondary | ICD-10-CM | POA: Insufficient documentation

## 2014-08-20 ENCOUNTER — Ambulatory Visit (INDEPENDENT_AMBULATORY_CARE_PROVIDER_SITE_OTHER): Payer: Self-pay | Admitting: Urology

## 2014-08-20 ENCOUNTER — Encounter: Payer: Self-pay | Admitting: Urology

## 2014-08-20 VITALS — BP 149/77 | HR 59 | Ht 69.0 in | Wt 181.6 lb

## 2014-08-20 DIAGNOSIS — F329 Major depressive disorder, single episode, unspecified: Secondary | ICD-10-CM | POA: Insufficient documentation

## 2014-08-20 DIAGNOSIS — F32A Depression, unspecified: Secondary | ICD-10-CM | POA: Insufficient documentation

## 2014-08-20 DIAGNOSIS — N400 Enlarged prostate without lower urinary tract symptoms: Secondary | ICD-10-CM | POA: Insufficient documentation

## 2014-08-20 DIAGNOSIS — R972 Elevated prostate specific antigen [PSA]: Secondary | ICD-10-CM

## 2014-08-20 DIAGNOSIS — N529 Male erectile dysfunction, unspecified: Secondary | ICD-10-CM | POA: Insufficient documentation

## 2014-08-20 DIAGNOSIS — M1990 Unspecified osteoarthritis, unspecified site: Secondary | ICD-10-CM | POA: Insufficient documentation

## 2014-08-20 DIAGNOSIS — R32 Unspecified urinary incontinence: Secondary | ICD-10-CM

## 2014-08-20 LAB — URINALYSIS, COMPLETE
Bilirubin, UA: NEGATIVE
Glucose, UA: NEGATIVE
Ketones, UA: NEGATIVE
Leukocytes, UA: NEGATIVE
NITRITE UA: NEGATIVE
PH UA: 5.5 (ref 5.0–7.5)
Protein, UA: NEGATIVE
RBC UA: NEGATIVE
SPEC GRAV UA: 1.01 (ref 1.005–1.030)
UUROB: 0.2 mg/dL (ref 0.2–1.0)

## 2014-08-20 LAB — MICROSCOPIC EXAMINATION: Bacteria, UA: NONE SEEN

## 2014-08-20 MED ORDER — CIPROFLOXACIN HCL 500 MG PO TABS
500.0000 mg | ORAL_TABLET | Freq: Once | ORAL | Status: AC
  Administered 2014-08-20: 500 mg via ORAL

## 2014-08-20 MED ORDER — LIDOCAINE HCL 2 % EX GEL
1.0000 "application " | Freq: Once | CUTANEOUS | Status: AC
  Administered 2014-08-20: 1 via URETHRAL

## 2014-08-20 NOTE — Progress Notes (Signed)
    Cystoscopy Procedure Note  Patient identification was confirmed, informed consent was obtained, and patient was prepped using Betadine solution.  Lidocaine jelly was administered per urethral meatus.    Preoperative abx where received prior to procedure.     Pre-Procedure: - Inspection reveals a normal caliber ureteral meatus.  Procedure: The flexible cystoscope was introduced without difficulty - No urethral strictures/lesions are present. - Prostate is slightly enlarged and it is lateral lobe enlargement. It is not an obstructive enlargement and there is minimal trabeculation of the bladder.  - Bilateral ureteral orifices identified - Bladder mucosa  reveals no ulcers, tumors, or lesions - No bladder stones - No trabeculation  Retroflexion shows no bladder tumors or masses   Post-Procedure: - Patient tolerated the procedure well  ac

## 2014-08-20 NOTE — Progress Notes (Signed)
08/20/2014 10:20 AM   Richard Chapman 09/18/1940 [id number]  Referring provider: Adrian Prows, MD [place], [place] [address]  Chief Complaint  Patient presents with  . Cysto    HPI: Patient has urgency and frequency of urination with fecal and bladder incontinence. Cystoscopy today revealed no obstructive outlet symptoms the bladder itself was normal with no severe trabeculation. I placed him on Vesicare 10 mg daily and will evaluate this on his next visit. His next visit will be for prostatic biopsy as his PSA has increased to 9.1 from being in 3-4 range in the past. 11 years ago he had negative biopsy. His prostate itself does not feel nodular but is slightly enlarged. He only enlargement of his prostate he has within his prostatic fossa on cystoscopy is slight lateral lobe enlargement     PMH: Past Medical History  Diagnosis Date  . History of nephrolithiasis   . GERD (gastroesophageal reflux disease)   . Anxiety and depression   . Skin cancer   . Depression   . Diabetes   . Hypertension   . Sleep apnea   . Elevated PSA     Surgical History: Past Surgical History  Procedure Laterality Date  . Tonsillectomy    . Knee surgery Bilateral   . Shoulder surgery    . Back surgery    . Neck surgery    . Appendectomy    . Wrist surgery      Home Medications:    Medication List       This list is accurate as of: 08/20/14 10:20 AM.  Always use your most recent med list.               amLODipine 5 MG tablet  Commonly known as:  NORVASC  Take by mouth.     aspirin 81 MG tablet  Take 81 mg by mouth.     atorvastatin 10 MG tablet  Commonly known as:  LIPITOR  TAKE ONE-HALF (1/2) TABLET ONCE A DAY     escitalopram 20 MG tablet  Commonly known as:  LEXAPRO  Take by mouth.     esomeprazole 20 MG capsule  Commonly known as:  NEXIUM     furosemide 40 MG tablet  Commonly known as:  LASIX  Take by mouth.     gabapentin 100 MG capsule    Commonly known as:  NEURONTIN  Take by mouth.     HUMALOG 100 UNIT/ML injection  Generic drug:  insulin lispro  Inject into the skin.     isosorbide mononitrate 60 MG 24 hr tablet  Commonly known as:  IMDUR  Take by mouth.     metFORMIN 1000 MG tablet  Commonly known as:  GLUCOPHAGE  Take by mouth.     nitroGLYCERIN 0.4 MG SL tablet  Commonly known as:  NITROSTAT  Place under the tongue.     propranolol 20 MG tablet  Commonly known as:  INDERAL     tamsulosin 0.4 MG Caps capsule  Commonly known as:  FLOMAX  Take by mouth.     V-GO 30 Kit        Allergies: No Known Allergies  Family History: Family History  Problem Relation Age of Onset  . Heart disease Mother   . Heart disease Father   . Diabetes Father   . Stroke Father     Social History:  reports that he quit smoking about 2 months ago. He does not have any  smokeless tobacco history on file. He reports that he drinks about 1.2 oz of alcohol per week. He reports that he does not use illicit drugs.  ROS: UROLOGY Frequent Urination?: Yes Hard to postpone urination?: Yes Burning/pain with urination?: No Get up at night to urinate?: Yes Leakage of urine?: Yes Urine stream starts and stops?: No Trouble starting stream?: No Do you have to strain to urinate?: No Blood in urine?: No Urinary tract infection?: No Sexually transmitted disease?: No Injury to kidneys or bladder?: No Painful intercourse?: No Weak stream?: No Erection problems?: Yes Penile pain?: No Gastrointestinal Nausea?: No Vomiting?: No Indigestion/heartburn?: No Diarrhea?: No Constipation?: Yes Constitutional Fever: No Night sweats?: No Weight loss?: No Fatigue?: No Skin Skin rash/lesions?: No Itching?: No Eyes Blurred vision?: No Double vision?: No Ears/Nose/Throat Sore throat?: No Sinus problems?: No Hematologic/Lymphatic Swollen glands?: No Easy bruising?: No Cardiovascular Leg swelling?: No Chest pain?:  No Respiratory Cough?: No Shortness of breath?: No Endocrine Excessive thirst?: No Musculoskeletal Back pain?: Yes Joint pain?: Yes Neurological Headaches?: No Dizziness?: No Psychologic Depression?: No Anxiety?: No    Urological Symptom Review   N    Physical Exam: BP 149/77 mmHg  Pulse 59  Ht _0  (1.753 m)  Wt 181 lb 9.6 oz (82.373 kg)  BMI 26.81 kg/m2  Constitutional:  Alert and oriented, No acute distress. HEENT: [facility] AT, moist mucus membranes.  Trachea midline, no masses. Cardiovascular: No clubbing, cyanosis, or edema. Respiratory: Normal respiratory effort, no increased work of breathing. GI: Abdomen is soft, nontender, nondistended, no abdominal masses GU: No CVA tenderness. Prostate is grade 1-2 over 4 nonnodular smooth firm. Rectal exam reveals no tumors masses or growths. Penis is circumcised. Skin: No rashes, bruises or suspicious lesions. Lymph: No cervical or inguinal adenopathy. Neurologic: Grossly intact, no focal deficits, moving all 4 extremities. Psychiatric: Normal mood and affect.  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 Elevated PSA.  1. Urinary incontinence, unspecified incontinence type  - Urinalysis, Complete - ciprofloxacin (CIPRO) tablet 500 mg; Take 1 tablet (500 mg total) by mouth once. - lidocaine (XYLOCAINE) 2 % jelly 1 application; Place 1 application into the urethra  once.  2. Elevated PSA    No Follow-up on file.  Collier Flowers, [facility] [address] [place], [place] [address] [phone]

## 2014-09-22 ENCOUNTER — Ambulatory Visit (INDEPENDENT_AMBULATORY_CARE_PROVIDER_SITE_OTHER): Payer: Medicare Other | Admitting: Urology

## 2014-09-22 ENCOUNTER — Other Ambulatory Visit: Payer: Self-pay | Admitting: Urology

## 2014-09-22 VITALS — BP 189/83 | HR 63 | Ht 69.0 in | Wt 180.6 lb

## 2014-09-22 DIAGNOSIS — R972 Elevated prostate specific antigen [PSA]: Secondary | ICD-10-CM

## 2014-09-22 MED ORDER — LEVOFLOXACIN 500 MG PO TABS
500.0000 mg | ORAL_TABLET | Freq: Once | ORAL | Status: AC
  Administered 2014-09-22: 500 mg via ORAL

## 2014-09-22 MED ORDER — GENTAMICIN SULFATE 40 MG/ML IJ SOLN
80.0000 mg | Freq: Once | INTRAMUSCULAR | Status: AC
  Administered 2014-09-22: 80 mg via INTRAMUSCULAR

## 2014-09-22 NOTE — Progress Notes (Signed)
Prostate Biopsy Procedure   Informed consent was obtained after discussing risks/benefits of the procedure.  A time out was performed to ensure correct patient identity.  Pre-Procedure: - Last PSA Level:9.0  Gentamicin given prophylactically - Levaquin 500 mg administered PO -Transrectal Ultrasound performed revealing a a 60 gm prostate -No significant hypoechoic or median lobe noted  Procedure: - Prostate block performed using 10 cc 1% lidocaine and biopsies taken from sextant areas, a total of 12 under ultrasound guidance.  Post-Procedure: - Patient tolerated the procedure well - He was counseled to seek immediate medical attention if experiences any severe pain, significant bleeding, or fevers - Return in one week to discuss biopsy results

## 2014-09-26 LAB — PATHOLOGY REPORT

## 2014-09-28 ENCOUNTER — Other Ambulatory Visit: Payer: Self-pay | Admitting: Urology

## 2014-09-28 ENCOUNTER — Ambulatory Visit (INDEPENDENT_AMBULATORY_CARE_PROVIDER_SITE_OTHER): Payer: Medicare Other | Admitting: Urology

## 2014-09-28 VITALS — BP 179/71 | HR 91 | Ht 69.0 in | Wt 180.0 lb

## 2014-09-28 DIAGNOSIS — N3941 Urge incontinence: Secondary | ICD-10-CM

## 2014-09-28 DIAGNOSIS — C61 Malignant neoplasm of prostate: Secondary | ICD-10-CM | POA: Diagnosis not present

## 2014-09-28 DIAGNOSIS — R159 Full incontinence of feces: Secondary | ICD-10-CM

## 2014-09-28 NOTE — Progress Notes (Signed)
09/28/2014 10:50 AM   Richard Chapman February 18, 1940 [id number]  Referring provider: Adrian Prows, MD [place], [place] [address]  Chief Complaint  Patient presents with  . Results    HPI: 74 year old male who underwent prostate biopsy on 09/22/2014 who presents today to review his pathology results.  He underwent biopsy for rising PSA up to 9.1 from 3-4 with no recent past. He is status post negative biopsy 11 years ago. Rectal exam was benign. TRUS volume 60 cc.   Pathology shows 2 cores positive for Gleason 3+3 involving the right apex, up to 23% of tissue.  There is evidence of both chronic and acute inflammation.  He does have urinary frequency and urgency.  He now wear depends for both urge incontinence as well as fecal incontinence. He does feel that he is able to empty his urinary bladder and has a fairly decent stream when he does void into the toilet. He gets up approximately twice nightly to void.  He is not seen neurology or gastroenterology for further evaluation of his fecal incontinence which is fairly new onset.  He also has severe baseline ED.    He presents to the office today with his wife to review his pathology results.  PMH: Past Medical History  Diagnosis Date  . History of nephrolithiasis   . GERD (gastroesophageal reflux disease)   . Anxiety and depression   . Skin cancer   . Depression   . Diabetes   . Hypertension   . Sleep apnea   . Elevated PSA     Surgical History: Past Surgical History  Procedure Laterality Date  . Tonsillectomy    . Knee surgery Bilateral   . Shoulder surgery    . Back surgery    . Neck surgery    . Appendectomy    . Wrist surgery      Home Medications:    Medication List       This list is accurate as of: 09/28/14 11:59 PM.  Always use your most recent med list.               amLODipine 5 MG tablet  Commonly known as:  NORVASC  Take by mouth.     aspirin 81 MG tablet  Take 81 mg  by mouth.     atorvastatin 10 MG tablet  Commonly known as:  LIPITOR  TAKE ONE-HALF (1/2) TABLET ONCE A DAY     escitalopram 20 MG tablet  Commonly known as:  LEXAPRO  Take by mouth.     esomeprazole 20 MG capsule  Commonly known as:  NEXIUM     furosemide 40 MG tablet  Commonly known as:  LASIX  Take by mouth.     gabapentin 100 MG capsule  Commonly known as:  NEURONTIN  Take by mouth.     HUMALOG 100 UNIT/ML injection  Generic drug:  insulin lispro  Inject into the skin.     isosorbide mononitrate 60 MG 24 hr tablet  Commonly known as:  IMDUR  Take by mouth.     metFORMIN 1000 MG tablet  Commonly known as:  GLUCOPHAGE  Take by mouth.     nitroGLYCERIN 0.4 MG SL tablet  Commonly known as:  NITROSTAT  Place under the tongue.     propranolol 20 MG tablet  Commonly known as:  INDERAL     tamsulosin 0.4 MG Caps capsule  Commonly known as:  FLOMAX  Take by mouth.  V-GO 30 Kit        Allergies: No Known Allergies  Family History: Family History  Problem Relation Age of Onset  . Heart disease Mother   . Heart disease Father   . Diabetes Father   . Stroke Father     Social History:  reports that he quit smoking about 4 months ago. He does not have any smokeless tobacco history on file. He reports that he drinks about 1.2 oz of alcohol per week. He reports that he does not use illicit drugs.  ROS: UROLOGY Frequent Urination?: Yes Hard to postpone urination?: No Burning/pain with urination?: No Get up at night to urinate?: Yes Leakage of urine?: Yes Urine stream starts and stops?: No Trouble starting stream?: No Do you have to strain to urinate?: No Blood in urine?: No Urinary tract infection?: No Sexually transmitted disease?: No Injury to kidneys or bladder?: No Painful intercourse?: No Weak stream?: No Erection problems?: Yes Penile pain?: No  Gastrointestinal Nausea?: No Vomiting?: No Indigestion/heartburn?: No Diarrhea?:  No Constipation?: No  Constitutional Fever: No Night sweats?: No Weight loss?: No Fatigue?: Yes  Skin Skin rash/lesions?: No Itching?: No  Eyes Blurred vision?: No Double vision?: No  Ears/Nose/Throat Sore throat?: No Sinus problems?: No  Hematologic/Lymphatic Swollen glands?: No Easy bruising?: No  Cardiovascular Leg swelling?: No Chest pain?: No  Respiratory Cough?: No Shortness of breath?: No  Endocrine Excessive thirst?: No  Musculoskeletal Back pain?: Yes Joint pain?: No  Neurological Headaches?: No Dizziness?: No  Psychologic Depression?: No Anxiety?: Yes  Physical Exam: BP 179/71 mmHg  Pulse 91  Ht 5' 9"  (1.753 m)  Wt 180 lb (81.647 kg)  BMI 26.57 kg/m2  Constitutional:  Alert and oriented, No acute distress.  Appears older than stated age. HEENT: [place] AT, moist mucus membranes.  Trachea midline, no masses. Cardiovascular: No clubbing, cyanosis, or edema. Respiratory: Normal respiratory effort, no increased work of breathing. Skin: No rashes, bruises or suspicious lesions. Neurologic: Grossly intact, no focal deficits, moving all 4 extremities. Psychiatric: Normal mood and affect.  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    1. Prostate cancer The patient was counseled about the natural history of prostate cancer and the standard treatment options that are available  for prostate cancer. It was explained to him how his age and life expectancy, clinical stage, Gleason score, and PSA affect his prognosis, the decision to proceed with additional staging studies, as well as how that information influences recommended treatment strategies. We discussed the roles for active surveillance, radiation therapy, surgical therapy, androgen deprivation, as well as ablative therapy options for the treatment of prostate cancer as appropriate to his individual cancer situation. We discussed the risks and benefits of these options with regard to their impact on cancer control and also in terms of potential adverse events, complications, and impact on quality of life particularly  related to urinary, bowel, and sexual function. The patient was encouraged to ask questions throughout the discussion today and all questions were answered to his stated satisfaction. In addition, the patient was providedwith and/or directed to appropriate resources and literature for further education about prostate cancer treatment options.  Although technically the patient also into the intermediate risk category based on his PSA, I suspect his PSA is additionally elevated secondary to inflammation of his prostate seen on the biopsy. I do feel that he is an excellent candidate for active surveillance and would recommend following quite closely with PSA/ DRE.  We discussed proceeding with confirmatory biopsy at 1 year sooner based on serial exams.  He is not a candidate for surgery  given his age and comorbidities.  We also discussed radiation in the form of external beam radiation versus brachytherapy seeds today.  At this point time, he was offered referral to radiation oncology but declined. He would prefer to proceed with active surveillance.  Plan for return in 4 months for PSA/ DRE,  2. Urge incontinence of urine Patient with a history of BPH on Flomax as well as urge incontinence in addition to fecal  incontinence. Per discussion today, he is never previously tried any medication for this. He was given samples of Mirbetriq 25 mg daily 2 weeks to try. I'm hesitant to start him on anticholinergic medication given his age.  He was previously seen by Dr. Elnoria Howard who had mentioned the possibility of other alternatives therefore will keep this appointment to discuss further.  3. Fecal incontinence Patient has had no workup for fecal incontinence per his report and I do feel that in the setting of large of any weakness, fecal and urinary incontinence, he should have a neurological workup. He will call his neurologist to arrange.   Return for keep f/u w/ hart for PVR ALSO f/u in 4 months for PSA/ DRE.  Hollice Espy, MD  [facility] [address], [place], [place] [address] [phone]  I spent 30 min with this patient of which greater than 50% was spent in counseling and coordination of care with the patient.

## 2014-09-29 ENCOUNTER — Encounter: Payer: Self-pay | Admitting: Urology

## 2014-10-08 ENCOUNTER — Ambulatory Visit: Payer: Medicare Other

## 2014-10-23 ENCOUNTER — Ambulatory Visit (INDEPENDENT_AMBULATORY_CARE_PROVIDER_SITE_OTHER): Payer: Medicare Other | Admitting: Urology

## 2014-10-23 ENCOUNTER — Encounter: Payer: Self-pay | Admitting: *Deleted

## 2014-10-23 VITALS — BP 156/80 | HR 66 | Ht 68.0 in | Wt 179.8 lb

## 2014-10-23 DIAGNOSIS — R339 Retention of urine, unspecified: Secondary | ICD-10-CM | POA: Diagnosis not present

## 2014-10-23 DIAGNOSIS — C61 Malignant neoplasm of prostate: Secondary | ICD-10-CM

## 2014-10-23 DIAGNOSIS — N3281 Overactive bladder: Secondary | ICD-10-CM

## 2014-10-23 LAB — URINALYSIS, COMPLETE
Bilirubin, UA: NEGATIVE
Ketones, UA: NEGATIVE
Leukocytes, UA: NEGATIVE
Nitrite, UA: NEGATIVE
PROTEIN UA: NEGATIVE
Specific Gravity, UA: 1.01 (ref 1.005–1.030)
Urobilinogen, Ur: 0.2 mg/dL (ref 0.2–1.0)
pH, UA: 5.5 (ref 5.0–7.5)

## 2014-10-23 LAB — MICROSCOPIC EXAMINATION
BACTERIA UA: NONE SEEN
EPITHELIAL CELLS (NON RENAL): NONE SEEN /HPF (ref 0–10)

## 2014-10-23 LAB — BLADDER SCAN AMB NON-IMAGING: Scan Result: 172

## 2014-10-23 NOTE — Progress Notes (Signed)
10/23/2014 12:07 PM   Richard Chapman 08-Apr-1940 [id number]  Referring provider: Adrian Prows, MD [address] [place], [place] [address]  Chief Complaint  Patient presents with  . 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 He is opted for watchful waiting so PSAs will be obtained 6 month intervals. Second problem is a poorly controlled overactive bladder with some urinary retention. Postvoid residual is 172. Think InterStim would be good for him because he has both some retention and some severe on could poorly controlled overactive bladder. Bladder diary and information about InterStim was given to the patient. He's not good Botox candidate because of his retention    PMH: Past Medical History  Diagnosis Date  . History of nephrolithiasis   . GERD (gastroesophageal reflux disease)   . Anxiety and depression   . Skin cancer   . Depression   . Diabetes   . Hypertension   . Sleep apnea   . Elevated PSA     Surgical History: Past Surgical History  Procedure Laterality Date  . Tonsillectomy    . Knee surgery Bilateral   . Shoulder surgery    . Back surgery    . Neck surgery    . Appendectomy    . Wrist surgery      Home Medications:    Medication List       This list is accurate as of: 10/23/14 12:07 PM.  Always use your most recent med list.               amLODipine 5 MG tablet  Commonly known as:  NORVASC  Take by mouth.     aspirin 81 MG tablet  Take 81 mg by mouth.     atorvastatin 10 MG tablet  Commonly known as:  LIPITOR  TAKE ONE-HALF (1/2) TABLET ONCE A DAY     escitalopram 20 MG tablet  Commonly known as:  LEXAPRO  Take by mouth.     esomeprazole 20 MG capsule  Commonly known as:  NEXIUM     furosemide 40 MG tablet  Commonly known as:  LASIX  Take by  mouth.     gabapentin 100 MG capsule  Commonly known as:  NEURONTIN  Take by mouth.     HUMALOG 100 UNIT/ML injection  Generic drug:  insulin lispro  Inject into the skin.     isosorbide mononitrate 60 MG 24 hr tablet  Commonly known as:  IMDUR  Take by mouth.     metFORMIN 1000 MG tablet  Commonly known as:  GLUCOPHAGE  Take by mouth.     nitroGLYCERIN 0.4 MG SL tablet  Commonly known as:  NITROSTAT  Place under the tongue.     propranolol 20 MG tablet  Commonly known as:  INDERAL     tamsulosin 0.4 MG Caps capsule  Commonly known as:  FLOMAX  Take by mouth.     V-GO 30 Kit        Allergies: No Known Allergies  Family History: Family History  Problem Relation Age of Onset  . Heart disease Mother   . Heart disease Father   . Diabetes Father   . Stroke Father   . Kidney disease Neg Hx   . Prostate cancer Neg Hx     Social  History:  reports that he quit smoking about 5 months ago. He does not have any smokeless tobacco history on file. He reports that he drinks about 1.2 oz of alcohol per week. He reports that he does not use illicit drugs.  ROS: UROLOGY Frequent Urination?: Yes Hard to postpone urination?: Yes Burning/pain with urination?: No Get up at night to urinate?: Yes Leakage of urine?: Yes Urine stream starts and stops?: Yes Trouble starting stream?: Yes Do you have to strain to urinate?: No Blood in urine?: No Urinary tract infection?: No Sexually transmitted disease?: No Injury to kidneys or bladder?: No Painful intercourse?: No Weak stream?: No Erection problems?: Yes Penile pain?: Yes  Gastrointestinal Nausea?: No Vomiting?: No Indigestion/heartburn?: Yes Diarrhea?: No Constipation?: No  Constitutional Fever: No Night sweats?: No Weight loss?: Yes Fatigue?: No  Skin Skin rash/lesions?: No Itching?: No  Eyes Blurred vision?: No Double vision?: No  Ears/Nose/Throat Sore throat?: No Sinus problems?:  No  Hematologic/Lymphatic Swollen glands?: No Easy bruising?: Yes  Cardiovascular Leg swelling?: No Chest pain?: No  Respiratory Cough?: Yes Shortness of breath?: No  Endocrine Excessive thirst?: No  Musculoskeletal Back pain?: Yes Joint pain?: Yes  Neurological Headaches?: Yes Dizziness?: No  Psychologic Depression?: No Anxiety?: Yes  Physical Exam: BP 156/80 mmHg  Pulse 66  Ht 5' 8"  (1.727 m)  Wt 179 lb 12.8 oz (81.557 kg)  BMI 27.34 kg/m2  Constitutional:  Alert and oriented, No acute distress. HEENT: [place] AT, moist mucus membranes.  Trachea midline, no masses. Cardiovascular: No clubbing, cyanosis, or edema. Respiratory: Normal respiratory effort, no increased work of breathing. GI: Abdomen is soft, nontender, nondistended, no abdominal masses GU: No CVA tenderness.  Skin: No rashes, bruises or suspicious lesions. Lymph: No cervical or inguinal adenopathy. Neurologic: Grossly intact, no focal deficits, moving all 4 extremities. Psychiatric: Normal mood and affect.  Laboratory Data: Lab Results  Component Value Date   WBC 8.9 03/06/2014   HGB 12.7* 03/06/2014   HCT 38.8* 03/06/2014   MCV 91 03/06/2014   PLT 152 03/06/2014    Lab Results  Component Value Date   CREATININE 1.42* 03/06/2014    No results found for: PSA  No results found for: TESTOSTERONE  No results found for: HGBA1C  Urinalysis    Component Value Date/Time   GLUCOSEU Negative 08/20/2014 1004   BILIRUBINUR Negative 08/20/2014 1004   NITRITE Negative 08/20/2014 1004   LEUKOCYTESUR Negative 08/20/2014 1004    Pertinent Imaging: None  Assessment & Plan:  Overactive bladder and prostatic cancer. Prostate cancer is watchful waiting cancers to grade 6 in 2 Apical areas. He was recommended watchful waiting by Dr. Hollice Espy. I reviewed his path report with him and his wife agree with her. 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 In addition to this he has his overactive bladder which is not well controlled on anticholinergics. Try him onmybetriq and this does not work put him on Vesicare 5 mg daily for the next week in the form of samples. If this does wear help him in 2 weeks consider him for InterSt   literature giv1. OAB (overactive bladder) Given literature regarding InterStim - Urinalysis, Complete - BLADDER SCAN AMB NON-IMAGING   No Follow-up on file.  Collier Flowers, [facility] [address], [place], [place] [address] [phone]

## 2014-11-25 ENCOUNTER — Observation Stay
Admission: EM | Admit: 2014-11-25 | Discharge: 2014-11-26 | Disposition: A | Discharge status: Home / Self Care | Payer: Medicare Other | Attending: Internal Medicine | Admitting: Internal Medicine

## 2014-11-25 ENCOUNTER — Encounter: Payer: Self-pay | Admitting: Emergency Medicine

## 2014-11-25 ENCOUNTER — Emergency Department: Payer: Medicare Other

## 2014-11-25 DIAGNOSIS — Z87891 Personal history of nicotine dependence: Secondary | ICD-10-CM | POA: Insufficient documentation

## 2014-11-25 DIAGNOSIS — E162 Hypoglycemia, unspecified: Secondary | ICD-10-CM | POA: Diagnosis not present

## 2014-11-25 DIAGNOSIS — Z7984 Long term (current) use of oral hypoglycemic drugs: Secondary | ICD-10-CM | POA: Insufficient documentation

## 2014-11-25 DIAGNOSIS — I1310 Hypertensive heart and chronic kidney disease without heart failure, with stage 1 through stage 4 chronic kidney disease, or unspecified chronic kidney disease: Secondary | ICD-10-CM | POA: Diagnosis not present

## 2014-11-25 DIAGNOSIS — Z7982 Long term (current) use of aspirin: Secondary | ICD-10-CM | POA: Diagnosis not present

## 2014-11-25 DIAGNOSIS — Z8042 Family history of malignant neoplasm of prostate: Secondary | ICD-10-CM | POA: Diagnosis not present

## 2014-11-25 DIAGNOSIS — Z823 Family history of stroke: Secondary | ICD-10-CM | POA: Insufficient documentation

## 2014-11-25 DIAGNOSIS — G4730 Sleep apnea, unspecified: Secondary | ICD-10-CM | POA: Insufficient documentation

## 2014-11-25 DIAGNOSIS — Z87442 Personal history of urinary calculi: Secondary | ICD-10-CM | POA: Insufficient documentation

## 2014-11-25 DIAGNOSIS — E1122 Type 2 diabetes mellitus with diabetic chronic kidney disease: Secondary | ICD-10-CM | POA: Diagnosis not present

## 2014-11-25 DIAGNOSIS — I25119 Atherosclerotic heart disease of native coronary artery with unspecified angina pectoris: Secondary | ICD-10-CM | POA: Insufficient documentation

## 2014-11-25 DIAGNOSIS — Z85828 Personal history of other malignant neoplasm of skin: Secondary | ICD-10-CM | POA: Insufficient documentation

## 2014-11-25 DIAGNOSIS — F419 Anxiety disorder, unspecified: Secondary | ICD-10-CM | POA: Insufficient documentation

## 2014-11-25 DIAGNOSIS — Z8249 Family history of ischemic heart disease and other diseases of the circulatory system: Secondary | ICD-10-CM | POA: Diagnosis not present

## 2014-11-25 DIAGNOSIS — N3941 Urge incontinence: Secondary | ICD-10-CM | POA: Diagnosis not present

## 2014-11-25 DIAGNOSIS — F329 Major depressive disorder, single episode, unspecified: Secondary | ICD-10-CM | POA: Diagnosis not present

## 2014-11-25 DIAGNOSIS — M545 Low back pain: Secondary | ICD-10-CM | POA: Insufficient documentation

## 2014-11-25 DIAGNOSIS — R972 Elevated prostate specific antigen [PSA]: Secondary | ICD-10-CM | POA: Diagnosis not present

## 2014-11-25 DIAGNOSIS — M1990 Unspecified osteoarthritis, unspecified site: Secondary | ICD-10-CM | POA: Insufficient documentation

## 2014-11-25 DIAGNOSIS — I10 Essential (primary) hypertension: Secondary | ICD-10-CM | POA: Insufficient documentation

## 2014-11-25 DIAGNOSIS — R5383 Other fatigue: Secondary | ICD-10-CM | POA: Insufficient documentation

## 2014-11-25 DIAGNOSIS — E538 Deficiency of other specified B group vitamins: Secondary | ICD-10-CM | POA: Diagnosis not present

## 2014-11-25 DIAGNOSIS — Z833 Family history of diabetes mellitus: Secondary | ICD-10-CM | POA: Diagnosis not present

## 2014-11-25 DIAGNOSIS — Z8546 Personal history of malignant neoplasm of prostate: Secondary | ICD-10-CM | POA: Insufficient documentation

## 2014-11-25 DIAGNOSIS — G250 Essential tremor: Secondary | ICD-10-CM | POA: Diagnosis not present

## 2014-11-25 DIAGNOSIS — E7801 Familial hypercholesterolemia: Secondary | ICD-10-CM | POA: Diagnosis not present

## 2014-11-25 DIAGNOSIS — Z794 Long term (current) use of insulin: Secondary | ICD-10-CM | POA: Diagnosis not present

## 2014-11-25 DIAGNOSIS — N183 Chronic kidney disease, stage 3 (moderate): Secondary | ICD-10-CM | POA: Diagnosis not present

## 2014-11-25 DIAGNOSIS — Z841 Family history of disorders of kidney and ureter: Secondary | ICD-10-CM | POA: Diagnosis not present

## 2014-11-25 DIAGNOSIS — K224 Dyskinesia of esophagus: Secondary | ICD-10-CM | POA: Insufficient documentation

## 2014-11-25 DIAGNOSIS — R079 Chest pain, unspecified: Principal | ICD-10-CM | POA: Diagnosis present

## 2014-11-25 DIAGNOSIS — Z79899 Other long term (current) drug therapy: Secondary | ICD-10-CM | POA: Diagnosis not present

## 2014-11-25 DIAGNOSIS — J40 Bronchitis, not specified as acute or chronic: Secondary | ICD-10-CM | POA: Insufficient documentation

## 2014-11-25 DIAGNOSIS — K219 Gastro-esophageal reflux disease without esophagitis: Secondary | ICD-10-CM | POA: Insufficient documentation

## 2014-11-25 LAB — CBC
HEMATOCRIT: 41.9 % (ref 40.0–52.0)
HEMOGLOBIN: 14.3 g/dL (ref 13.0–18.0)
MCH: 31.6 pg (ref 26.0–34.0)
MCHC: 34.1 g/dL (ref 32.0–36.0)
MCV: 92.6 fL (ref 80.0–100.0)
Platelets: 158 10*3/uL (ref 150–440)
RBC: 4.53 MIL/uL (ref 4.40–5.90)
RDW: 14 % (ref 11.5–14.5)
WBC: 13 10*3/uL — AB (ref 3.8–10.6)

## 2014-11-25 LAB — BASIC METABOLIC PANEL
ANION GAP: 10 (ref 5–15)
BUN: 16 mg/dL (ref 6–20)
CHLORIDE: 99 mmol/L — AB (ref 101–111)
CO2: 26 mmol/L (ref 22–32)
Calcium: 9.3 mg/dL (ref 8.9–10.3)
Creatinine, Ser: 1.13 mg/dL (ref 0.61–1.24)
GFR calc Af Amer: >60 mL/min (ref >60)
GFR calc non Af Amer: >60 mL/min (ref >60)
Glucose, Bld: 213 mg/dL — ABNORMAL HIGH (ref 65–99)
POTASSIUM: 3.3 mmol/L — AB (ref 3.5–5.1)
SODIUM: 135 mmol/L (ref 135–145)

## 2014-11-25 LAB — GLUCOSE, CAPILLARY
GLUCOSE-CAPILLARY: 223 mg/dL — AB (ref 65–99)
Glucose-Capillary: 227 mg/dL — ABNORMAL HIGH (ref 65–99)
Glucose-Capillary: 284 mg/dL — ABNORMAL HIGH (ref 65–99)
Glucose-Capillary: 363 mg/dL — ABNORMAL HIGH (ref 65–99)

## 2014-11-25 LAB — TROPONIN I
Troponin I: <0.03 ng/mL (ref <0.031)
Troponin I: <0.03 ng/mL (ref <0.031)

## 2014-11-25 MED ORDER — MORPHINE SULFATE (PF) 4 MG/ML IV SOLN
4.0000 mg | Freq: Once | INTRAVENOUS | Status: AC
  Administered 2014-11-25: 4 mg via INTRAVENOUS

## 2014-11-25 MED ORDER — MORPHINE SULFATE (PF) 4 MG/ML IV SOLN
INTRAVENOUS | Status: AC
  Administered 2014-11-25: 4 mg via INTRAVENOUS
  Filled 2014-11-25: qty 1

## 2014-11-25 MED ORDER — DOCUSATE SODIUM 100 MG PO CAPS
100.0000 mg | ORAL_CAPSULE | Freq: Two times a day (BID) | ORAL | Status: DC
  Administered 2014-11-25 – 2014-11-26 (×3): 100 mg via ORAL
  Filled 2014-11-25 (×3): qty 1

## 2014-11-25 MED ORDER — PROPRANOLOL HCL 10 MG PO TABS
20.0000 mg | ORAL_TABLET | Freq: Two times a day (BID) | ORAL | Status: DC
  Administered 2014-11-25 – 2014-11-26 (×2): 20 mg via ORAL
  Filled 2014-11-25 (×3): qty 2

## 2014-11-25 MED ORDER — ENOXAPARIN SODIUM 40 MG/0.4ML ~~LOC~~ SOLN
40.0000 mg | SUBCUTANEOUS | Status: DC
  Administered 2014-11-25 – 2014-11-26 (×2): 40 mg via SUBCUTANEOUS
  Filled 2014-11-25 (×3): qty 0.4

## 2014-11-25 MED ORDER — SODIUM CHLORIDE 0.9 % IJ SOLN
3.0000 mL | INTRAMUSCULAR | Status: DC | PRN
  Administered 2014-11-26: 3 mL via INTRAVENOUS
  Filled 2014-11-25: qty 10

## 2014-11-25 MED ORDER — SODIUM CHLORIDE 0.9 % IJ SOLN
3.0000 mL | Freq: Two times a day (BID) | INTRAMUSCULAR | Status: DC
  Administered 2014-11-25: 3 mL via INTRAVENOUS

## 2014-11-25 MED ORDER — NITROGLYCERIN 0.4 MG SL SUBL
0.4000 mg | SUBLINGUAL_TABLET | SUBLINGUAL | Status: DC | PRN
Start: 2014-11-25 — End: 2014-11-26

## 2014-11-25 MED ORDER — POTASSIUM CHLORIDE CRYS ER 20 MEQ PO TBCR
40.0000 meq | EXTENDED_RELEASE_TABLET | ORAL | Status: AC
  Administered 2014-11-25 (×2): 40 meq via ORAL
  Filled 2014-11-25 (×2): qty 2

## 2014-11-25 MED ORDER — ASPIRIN EC 81 MG PO TBEC: 81.0000 mg | DELAYED_RELEASE_TABLET | Freq: Every day | ORAL | Status: DC

## 2014-11-25 MED ORDER — GI COCKTAIL ~~LOC~~
30.0000 mL | ORAL | Status: AC
  Administered 2014-11-25: 30 mL via ORAL
  Filled 2014-11-25: qty 30

## 2014-11-25 MED ORDER — ISOSORBIDE MONONITRATE ER 60 MG PO TB24
60.0000 mg | ORAL_TABLET | Freq: Every day | ORAL | Status: DC
  Administered 2014-11-25 – 2014-11-26 (×2): 60 mg via ORAL
  Filled 2014-11-25 (×2): qty 1

## 2014-11-25 MED ORDER — BISACODYL 5 MG PO TBEC
5.0000 mg | DELAYED_RELEASE_TABLET | Freq: Every day | ORAL | Status: DC | PRN
  Filled 2014-11-25: qty 1

## 2014-11-25 MED ORDER — PREDNISONE 50 MG PO TABS
50.0000 mg | ORAL_TABLET | Freq: Every day | ORAL | Status: DC
  Administered 2014-11-25 – 2014-11-26 (×2): 50 mg via ORAL
  Filled 2014-11-25 (×2): qty 1

## 2014-11-25 MED ORDER — ACETAMINOPHEN 650 MG RE SUPP: 650.0000 mg | Freq: Four times a day (QID) | RECTAL | Status: DC | PRN

## 2014-11-25 MED ORDER — ATORVASTATIN CALCIUM 10 MG PO TABS
5.0000 mg | ORAL_TABLET | Freq: Every day | ORAL | Status: DC
  Administered 2014-11-25: 5 mg via ORAL
  Filled 2014-11-25: qty 1

## 2014-11-25 MED ORDER — INSULIN GLARGINE 100 UNIT/ML ~~LOC~~ SOLN
16.0000 [IU] | Freq: Every day | SUBCUTANEOUS | Status: DC
Start: 2014-11-25 — End: 2014-11-26
  Administered 2014-11-25: 16 [IU] via SUBCUTANEOUS
  Filled 2014-11-25 (×2): qty 0.16

## 2014-11-25 MED ORDER — ATORVASTATIN CALCIUM 10 MG PO TABS: 10.0000 mg | ORAL_TABLET | Freq: Every day | ORAL | Status: DC

## 2014-11-25 MED ORDER — ALBUTEROL SULFATE (2.5 MG/3ML) 0.083% IN NEBU: 2.5000 mg | INHALATION_SOLUTION | RESPIRATORY_TRACT | Status: DC | PRN

## 2014-11-25 MED ORDER — ESCITALOPRAM OXALATE 10 MG PO TABS
20.0000 mg | ORAL_TABLET | Freq: Every day | ORAL | Status: DC
  Administered 2014-11-25 – 2014-11-26 (×2): 20 mg via ORAL
  Filled 2014-11-25 (×2): qty 2

## 2014-11-25 MED ORDER — INSULIN ASPART 100 UNIT/ML ~~LOC~~ SOLN
0.0000 [IU] | Freq: Every day | SUBCUTANEOUS | Status: DC
  Administered 2014-11-25: 5 [IU] via SUBCUTANEOUS
  Filled 2014-11-25: qty 5

## 2014-11-25 MED ORDER — SODIUM CHLORIDE 0.9 % IV SOLN: 250.0000 mL | INTRAVENOUS | Status: DC | PRN

## 2014-11-25 MED ORDER — ASPIRIN 81 MG PO CHEW
81.0000 mg | CHEWABLE_TABLET | Freq: Every day | ORAL | Status: DC
  Administered 2014-11-25 – 2014-11-26 (×2): 81 mg via ORAL
  Filled 2014-11-25 (×2): qty 1

## 2014-11-25 MED ORDER — OXYCODONE HCL 5 MG PO TABS: 5.0000 mg | ORAL_TABLET | ORAL | Status: DC | PRN

## 2014-11-25 MED ORDER — PRIMIDONE 50 MG PO TABS
50.0000 mg | ORAL_TABLET | Freq: Two times a day (BID) | ORAL | Status: DC
  Administered 2014-11-25 – 2014-11-26 (×3): 50 mg via ORAL
  Filled 2014-11-25 (×4): qty 1

## 2014-11-25 MED ORDER — ONDANSETRON HCL 4 MG/2ML IJ SOLN
4.0000 mg | Freq: Once | INTRAMUSCULAR | Status: AC
  Administered 2014-11-25: 4 mg via INTRAVENOUS

## 2014-11-25 MED ORDER — TAMSULOSIN HCL 0.4 MG PO CAPS
0.4000 mg | ORAL_CAPSULE | Freq: Every day | ORAL | Status: DC
  Administered 2014-11-25: 0.4 mg via ORAL
  Filled 2014-11-25: qty 1

## 2014-11-25 MED ORDER — INSULIN ASPART 100 UNIT/ML ~~LOC~~ SOLN
0.0000 [IU] | Freq: Three times a day (TID) | SUBCUTANEOUS | Status: DC
  Administered 2014-11-25: 5 [IU] via SUBCUTANEOUS
  Filled 2014-11-25: qty 5

## 2014-11-25 MED ORDER — ONDANSETRON HCL 4 MG/2ML IJ SOLN
INTRAMUSCULAR | Status: AC
  Administered 2014-11-25: 4 mg via INTRAVENOUS
  Filled 2014-11-25: qty 2

## 2014-11-25 MED ORDER — AMLODIPINE BESYLATE 5 MG PO TABS
5.0000 mg | ORAL_TABLET | Freq: Every day | ORAL | Status: DC
  Administered 2014-11-25 – 2014-11-26 (×2): 5 mg via ORAL
  Filled 2014-11-25 (×2): qty 1

## 2014-11-25 MED ORDER — SODIUM CHLORIDE 0.9 % IJ SOLN
3.0000 mL | Freq: Two times a day (BID) | INTRAMUSCULAR | Status: DC
  Administered 2014-11-25 – 2014-11-26 (×3): 3 mL via INTRAVENOUS

## 2014-11-25 MED ORDER — PANTOPRAZOLE SODIUM 40 MG IV SOLR
40.0000 mg | Freq: Two times a day (BID) | INTRAVENOUS | Status: DC
  Administered 2014-11-25 – 2014-11-26 (×3): 40 mg via INTRAVENOUS
  Filled 2014-11-25 (×3): qty 40

## 2014-11-25 MED ORDER — ACETAMINOPHEN 325 MG PO TABS: 650.0000 mg | ORAL_TABLET | Freq: Four times a day (QID) | ORAL | Status: DC | PRN

## 2014-11-25 MED ORDER — FUROSEMIDE 40 MG PO TABS
40.0000 mg | ORAL_TABLET | Freq: Every day | ORAL | Status: DC
  Administered 2014-11-25 – 2014-11-26 (×2): 40 mg via ORAL
  Filled 2014-11-25 (×2): qty 1

## 2014-11-25 MED ORDER — MORPHINE SULFATE (PF) 4 MG/ML IV SOLN
4.0000 mg | INTRAVENOUS | Status: DC | PRN
  Administered 2014-11-25 – 2014-11-26 (×4): 4 mg via INTRAVENOUS
  Filled 2014-11-25 (×5): qty 1

## 2014-11-25 NOTE — Consult Note (Signed)
Reason for Consult: angina Referring Physician: Dr Clyde Lundborg Richard Chapman is an 74 y.o. male.  HPI: The patient presents with anginal symptoms describes a data 10 chest pain radiating to his neck lasting about 2 hours before presenting patient took 3 somewhat nitroglycerin without significant relief EMS administered aspirin nitroglycerin paste and embolic dimension for evaluation patient initial cardiogram done for negative he has had some dyspnea in the past she has had workup with Dr. Nehemiah Massed. Patient was found have mild coronary disease from previous catheterization she has had 3 in the past with no significant coronary disease. Patient now presents with recurrent anginal symptoms he is on the floor now but currently pain free after prolonged episode of angina as the patient describes  Past Medical History  Diagnosis Date  . History of nephrolithiasis   . GERD (gastroesophageal reflux disease)   . Anxiety and depression   . Skin cancer   . Depression   . Diabetes ([place])   . Hypertension   . Sleep apnea   . Elevated PSA     Past Surgical History  Procedure Laterality Date  . Tonsillectomy    . Knee surgery Bilateral   . Shoulder surgery    . Back surgery    . Neck surgery    . Appendectomy    . Wrist surgery      Family History  Problem Relation Age of Onset  . Heart disease Mother   . Heart disease Father   . Diabetes Father   . Stroke Father   . Kidney disease Neg Hx   . Prostate cancer Neg Hx     Social History:  reports that he quit smoking about 6 months ago. He does not have any smokeless tobacco history on file. He reports that he drinks about 1.2 oz of alcohol per week. He reports that he does not use illicit drugs. I have reviewed the patient's current medications. Allergies: No Known Allergies  Medications:   Results for orders placed or performed during the hospital encounter of 11/25/14 (from the past 48 hour(s))  Basic metabolic panel     Status:  Abnormal   Collection Time: 11/25/14  3:00 AM  Result Value Ref Range   Sodium 135 135 - 145 mmol/L   Potassium 3.3 (L) 3.5 - 5.1 mmol/L   Chloride 99 (L) 101 - 111 mmol/L   CO2 26 22 - 32 mmol/L   Glucose, Bld 213 (H) 65 - 99 mg/dL   BUN 16 6 - 20 mg/dL   Creatinine, Ser 1.13 0.61 - 1.24 mg/dL   Calcium 9.3 8.9 - 10.3 mg/dL   GFR calc non Af Amer >60 >60 mL/min   GFR calc Af Amer >60 >60 mL/min    Comment: (NOTE) The eGFR has been calculated using the CKD EPI equation. This calculation has not been validated in all clinical situations. eGFR's persistently <60 mL/min signify possible Chronic Kidney Disease.    Anion gap 10 5 - 15  CBC     Status: Abnormal   Collection Time: 11/25/14  3:00 AM  Result Value Ref Range   WBC 13.0 (H) 3.8 - 10.6 K/uL   RBC 4.53 4.40 - 5.90 MIL/uL   Hemoglobin 14.3 13.0 - 18.0 g/dL   HCT 41.9 40.0 - 52.0 %   MCV 92.6 80.0 - 100.0 fL   MCH 31.6 26.0 - 34.0 pg   MCHC 34.1 32.0 - 36.0 g/dL   RDW 14.0 11.5 - 14.5 %  Platelets 158 150 - 440 K/uL  Troponin I     Status: None   Collection Time: 11/25/14  3:00 AM  Result Value Ref Range   Troponin I <0.03 <0.031 ng/mL    Comment:        NO INDICATION OF MYOCARDIAL INJURY.   Troponin I     Status: None   Collection Time: 11/25/14  6:05 AM  Result Value Ref Range   Troponin I <0.03 <0.031 ng/mL    Comment:        NO INDICATION OF MYOCARDIAL INJURY.   Glucose, capillary     Status: Abnormal   Collection Time: 11/25/14  8:21 AM  Result Value Ref Range   Glucose-Capillary 223 (H) 65 - 99 mg/dL  Glucose, capillary     Status: Abnormal   Collection Time: 11/25/14 12:27 PM  Result Value Ref Range   Glucose-Capillary 227 (H) 65 - 99 mg/dL  Troponin I     Status: None   Collection Time: 11/25/14 12:58 PM  Result Value Ref Range   Troponin I <0.03 <0.031 ng/mL    Comment:        NO INDICATION OF MYOCARDIAL INJURY.   Glucose, capillary     Status: Abnormal   Collection Time: 11/25/14  4:50 PM   Result Value Ref Range   Glucose-Capillary 284 (H) 65 - 99 mg/dL    Dg Chest Port 1 View  11/25/2014  CLINICAL DATA:  Mid chest pain and elevated blood pressure since 8 p.m. History of prostate cancer, hypertension, diabetes. EXAM: PORTABLE CHEST 1 VIEW COMPARISON:  03/06/2014 FINDINGS: Normal heart size and pulmonary vascularity. No focal airspace disease or consolidation in the lungs. No blunting of costophrenic angles. No pneumothorax. Mediastinal contours appear intact. Degenerative changes in the thoracic spine. Postoperative changes in the cervical spine. IMPRESSION: No active disease. Electronically Signed   By: Lucienne Capers M.D.   On: 11/25/2014 03:21    Review of Systems  Constitutional: Positive for malaise/fatigue.  HENT: Negative.   Eyes: Negative.   Respiratory: Positive for shortness of breath.   Cardiovascular: Positive for chest pain.  Gastrointestinal: Negative.   Genitourinary: Negative.   Musculoskeletal: Negative.   Skin: Negative.   Neurological: Negative.   Endo/Heme/Allergies: Negative.   Psychiatric/Behavioral: Negative.    Blood pressure 159/78, pulse 83, temperature 97.7 F (36.5 C), temperature source Oral, resp. rate 18, height _0  (1.753 m), weight 81.194 kg (179 lb), SpO2 98 %. Physical Exam  Constitutional: He is oriented to person, place, and time. He appears well-developed and well-nourished.  HENT:  Head: Normocephalic and atraumatic.  Eyes: Conjunctivae and EOM are normal. Pupils are equal, round, and reactive to light.  Neck: Normal range of motion. Neck supple.  Cardiovascular: Normal rate, regular rhythm and normal heart sounds.   Respiratory: Effort normal and breath sounds normal.  GI: Soft. Bowel sounds are normal.  Musculoskeletal: Normal range of motion.  Neurological: He is alert and oriented to person, place, and time. He has normal reflexes.  Skin: Skin is warm and dry.  Psychiatric: He has a normal mood and affect.     Assessment/Plan:  chest pain  possible angina  coronary disease  GERD  diabetes  hypertension  obstructive sleep  depression  chronic renal insufficiency . PLAN  agreed to rule out myocardial infarction  continue diabetes management  continue lipid management with Lipitor  hypertension control with amlodipine propanolol  Myoview in the morning for evaluation of angina  echocardiogram as  part of assessment for angina and shortness of breath  GERD continue Protonix therapy  aspirin therapy should be continued  nitroglycerin p.r.n. For possible angina  based further evaluation and noninvasive cardiac workup    CALLWOOD,DWAYNE D. 11/25/2014, 5:41 PM

## 2014-11-25 NOTE — H&P (Signed)
[facility] [place] NAME: Richard Chapman    MR#:  [id number]  DATE OF BIRTH:  13-Oct-1940  DATE OF ADMISSION:  11/25/2014  PRIMARY CARE PHYSICIAN: Adrian Prows, MD   REQUESTING/REFERRING PHYSICIAN: Dr. Corky Downs  CHIEF COMPLAINT:   Chief Complaint  Patient presents with  . Chest Pain    HISTORY OF PRESENT ILLNESS:  Richard Chapman  is a 74 y.o. male with a known history of CAD, hypertension, diabetes, esophageal spasms presents to the emergency room complaining of acute onset of chest pain since 8 PM yesterday. Patient pain has not improved with nitroglycerin are multiple rounds of morphine in the emergency room. He is being admitted for further workup of his chest pain. There is no relation to exertion, food, position. No complaints of shortness of breath, sweating, lightheadedness or palpitations. No response to nitroglycerin. Patient has had catheterization in the past with his last one in 2012 needing no PCI. 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 Patient is a poor historian. He mentions that the last time he had similar pain was an year prior and has done well since.  PAST MEDICAL HISTORY:   Past Medical History  Diagnosis Date  . History of nephrolithiasis   . GERD (gastroesophageal reflux disease)   . Anxiety and depression   . Skin cancer   . Depression   . Diabetes ([place])   . Hypertension   . Sleep apnea   . Elevated PSA     PAST SURGICAL HISTORY:   Past Surgical History  Procedure Laterality Date  . Tonsillectomy    . Knee surgery Bilateral   . Shoulder surgery    . Back surgery    . Neck surgery    . Appendectomy    . Wrist surgery      SOCIAL HISTORY:   Social History  Substance Use Topics  . Smoking status: Former Smoker    Quit date: 05/21/2014  . Smokeless tobacco: Not on file  . Alcohol Use: 1.2 oz/week    2 Standard drinks or equivalent per week     FAMILY HISTORY:   Family History  Problem Relation Age of Onset  . Heart disease Mother   . Heart disease Father   . Diabetes Father   . Stroke Father   . Kidney disease Neg Hx   . Prostate cancer Neg Hx     DRUG ALLERGIES:  No Known Allergies  REVIEW OF SYSTEMS:   Review of Systems  Constitutional: Positive for malaise/fatigue. Negative for fever, chills and weight loss.  HENT: Negative for hearing loss and nosebleeds.   Eyes: Negative for blurred vision, double vision and pain.  Respiratory: Positive for cough. Negative for hemoptysis, sputum production, shortness of breath and wheezing.   Cardiovascular: Positive for chest pain. Negative for palpitations, orthopnea and leg swelling.  Gastrointestinal: Negative for nausea, vomiting, abdominal pain, diarrhea and constipation.  Genitourinary: Negative for dysuria and hematuria.  Musculoskeletal: Negative for myalgias, back pain and falls.  Skin: Negative for rash.  Neurological: Negative for dizziness, tremors, sensory change, speech change, focal weakness, seizures and headaches.  Endo/Heme/Allergies: Does not bruise/bleed easily.  Psychiatric/Behavioral: Negative for depression and memory loss. The patient is not nervous/anxious.     MEDICATIONS AT HOME:   Prior to Admission medications   Medication Sig Start Date End Date Taking? Authorizing Provider  amLODipine (NORVASC) 5 MG tablet Take by mouth.  Yes Historical Provider, MD  aspirin 81 MG tablet Take 81 mg by mouth. 09/12/10  Yes Historical Provider, MD  atorvastatin (LIPITOR) 10 MG tablet Take 5 mg by mouth daily.   Yes Historical Provider, MD  escitalopram (LEXAPRO) 20 MG tablet Take by mouth. 04/13/14  Yes Historical Provider, MD  esomeprazole ([place]) 20 MG capsule  06/10/14  Yes Historical Provider, MD  furosemide (LASIX) 40 MG tablet Take by mouth.   Yes Historical Provider, MD  gabapentin (NEURONTIN) 100 MG capsule Take by mouth.   Yes Historical Provider,  MD  HYDROcodone-acetaminophen (NORCO/VICODIN) 5-325 MG tablet Take 1 tablet by mouth every 6 (six) hours as needed for moderate pain.   Yes Historical Provider, MD  Insulin Disposable Pump (V-GO 30) KIT  07/27/14  Yes Historical Provider, MD  insulin lispro (HUMALOG) 100 UNIT/ML injection Inject into the skin.   Yes Historical Provider, MD  isosorbide mononitrate (IMDUR) 60 MG 24 hr tablet Take by mouth.   Yes Historical Provider, MD  metFORMIN (GLUCOPHAGE) 1000 MG tablet Take by mouth. 05/21/14  Yes Historical Provider, MD  nitroGLYCERIN (NITROSTAT) 0.4 MG SL tablet Place 0.4 mg under the tongue every 5 (five) minutes as needed for chest pain.    Yes Historical Provider, MD  primidone (MYSOLINE) 50 MG tablet Take 1 tablet by mouth 2 (two) times daily. 11/04/14  Yes Historical Provider, MD  propranolol (INDERAL) 20 MG tablet Take 20 mg by mouth 2 (two) times daily.    Yes Historical Provider, MD  tamsulosin (FLOMAX) 0.4 MG CAPS capsule Take by mouth. 05/21/14  Yes Historical Provider, MD      VITAL SIGNS:  Blood pressure 159/78, pulse 83, temperature 97.7 F (36.5 C), temperature source Oral, resp. rate 18, height 5' 9"  (1.753 m), weight 81.194 kg (179 lb), SpO2 98 %.  PHYSICAL EXAMINATION:  Physical Exam  GENERAL:  74 y.o.-year-old patient lying in the bed with no acute distress.  EYES: Pupils equal, round, reactive to light and accommodation. No scleral icterus. Extraocular muscles intact.  HEENT: Head atraumatic, normocephalic. Oropharynx and nasopharynx clear. No oropharyngeal erythema, moist oral mucosa  NECK:  Supple, no jugular venous distention. No thyroid enlargement, no tenderness.  LUNGS: , no  rales, rhonchi. No use of accessory muscles of respiration. Mild bilateral wheezing. CARDIOVASCULAR: S1, S2 normal. No murmurs, rubs, or gallops.  ABDOMEN: Soft, nontender, nondistended. Bowel sounds present. No organomegaly or mass.  EXTREMITIES: No pedal edema, cyanosis, or clubbing. + 2  pedal & radial pulses b/l.   NEUROLOGIC: Cranial nerves II through XII are intact. No focal Motor or sensory deficits appreciated b/l PSYCHIATRIC: The patient is alert and oriented x 3. Good affect.  SKIN: No obvious rash, lesion, or ulcer.   LABORATORY PANEL:   CBC  Recent Labs Lab 11/25/14 0300  WBC 13.0*  HGB 14.3  HCT 41.9  PLT 158   ------------------------------------------------------------------------------------------------------------------  Chemistries   Recent Labs Lab 11/25/14 0300  NA 135  K 3.3*  CL 99*  CO2 26  GLUCOSE 213*  BUN 16  CREATININE 1.13  CALCIUM 9.3   ------------------------------------------------------------------------------------------------------------------  Cardiac Enzymes  Recent Labs Lab 11/25/14 1258  TROPONINI <0.03   ------------------------------------------------------------------------------------------------------------------  RADIOLOGY:  Dg Chest Port 1 View  11/25/2014  CLINICAL DATA:  Mid chest pain and elevated blood pressure since 8 p.m. History of prostate cancer, hypertension, diabetes. EXAM: PORTABLE CHEST 1 VIEW COMPARISON:  03/06/2014 FINDINGS: Normal heart size and pulmonary vascularity. No focal airspace disease or consolidation in the lungs.  No blunting of costophrenic angles. No pneumothorax. Mediastinal contours appear intact. Degenerative changes in the thoracic spine. Postoperative changes in the cervical spine. IMPRESSION: No active disease. Electronically Signed   By: Lucienne Capers M.D.   On: 11/25/2014 03:21     IMPRESSION AND PLAN:   * Chest pain without radiation Patient's cardiac enzymes have been normal. He does have risk factors with prior history of CAD. Requested cardiology consult for further input regarding needing stress test versus cardiac catheterization. Continue aspirin, statin, beta blocker. Treatment monitoring. Nitroglycerin when necessary. 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  * Hypertension Continue home medications  * GERD PPI  * Insulin-dependent diabetes mellitus Patient wears a 24-hour disposable pump. At this point his pump is empty. We will start him on 16 units Lantus. Sliding scale insulin. His blood sugars might be uncontrolled due to a change in medication dosing. He needs to resume his insulin pump after discharge.  * Bronchitis Start prednisone  * DVT prophylaxis with Lovenox   All the records are reviewed and case discussed with ED provider. Management plans discussed with the patient, family and they are in agreement.  CODE STATUS: FULL  TOTAL TIME TAKING CARE OF THIS PATIENT: 40 minutes.    Hillary Bow R M.D on 11/25/2014 at 2:59 PM  Between 7am to 6pm - Pager - [phone]  After 6pm go to [email or web address] - password [email or web address] [facility]  [facility]  Office  [phone]  CC: Primary care physician; Adrian Prows, MD   Note: This dictation was prepared with Dragon dictation along with smaller phrase technology. Any transcriptional errors that result from this process are unintentional.

## 2014-11-25 NOTE — ED Notes (Signed)
Pt's wife came out of room while this nurse was calling report and stated that pt did not seem to be able to talk. Upon assessment, pt did talk and was oriented to all but time. He asked to go to restroom and was assisted to the toilet. Neuro assessment completed.

## 2014-11-25 NOTE — ED Provider Notes (Signed)
[facility] Emergency Department Provider Note  ____________________________________________  Time seen: 3:30AM  I have reviewed the triage vital signs and the nursing notes.   HISTORY  Chief Complaint Chest Pain      HPI [place] is a 74 y.o. male resents with 8 out of 10 substernal chest pain as radiating to his neck with onset approximately 2 hours before presentation. Patient administered 3 sublingual nitroglycerin tablets without relief EMS administered aspirin 324 mg as well as Nitropaste and right side of the patient's chest before presentation to the emergency department. Patient also admits to dyspnea. Patient states that he's had episodes similar to this in the past for which she's had 3 cardiac catheterization performed by Dr. Nehemiah Massed which revealed no evidence of significant stenosis he recalls only narrowing of a vessel been 25% which was revealed on his last catheterization approximately 3 years ago. Patient states that he was informed that he had esophageal spasms.     Past Medical History  Diagnosis Date  . History of nephrolithiasis   . GERD (gastroesophageal reflux disease)   . Anxiety and depression   . Skin cancer   . Depression   . Diabetes ([place])   . Hypertension   . Sleep apnea   . Elevated PSA     Patient Active Problem List   Diagnosis Date Noted  . Arthritis 08/20/2014  . Benign fibroma of prostate 08/20/2014  . Clinical depression 08/20/2014  . Failure of erection 08/20/2014  . Benign essential HTN 08/04/2014  . Acute low back pain 04/23/2014  . Alteration in bowel elimination: incontinence 04/23/2014  . Urge incontinence 04/23/2014  . 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 Benign essential tremor 12/23/2013  . B12 deficiency 08/13/2013  . Chronic kidney disease (CKD), stage III (moderate) 08/13/2013  . Diabetes ([place]) 08/13/2013  . Hypoglycemia 08/13/2013  . Arteriosclerosis of coronary artery 07/24/2013  .  Fatigue 07/24/2013  . Acid reflux 07/24/2013  . Combined fat and carbohydrate induced hyperlipemia 07/24/2013  . 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 Abnormal prostate specific antigen 09/01/2012  . Benign prostatic hyperplasia with urinary obstruction 09/01/2012    Past Surgical History  Procedure Laterality Date  . Tonsillectomy    . Knee surgery Bilateral   . Shoulder surgery    . Back surgery    . Neck surgery    . Appendectomy    . Wrist surgery      Current Outpatient Rx  Name  Route  Sig  Dispense  Refill  . amLODipine (NORVASC) 5 MG tablet   Oral   Take by mouth.         Marland Kitchen aspirin 81 MG tablet   Oral   Take 81 mg by mouth.         Marland Kitchen atorvastatin (LIPITOR) 10 MG tablet      TAKE ONE-HALF (1/2) TABLET ONCE A DAY         . escitalopram (LEXAPRO) 20 MG tablet   Oral   Take by mouth.         . esomeprazole (NEXIUM) 20 MG capsule               . furosemide (LASIX) 40 MG tablet   Oral   Take by mouth.         . gabapentin (NEURONTIN) 100 MG capsule   Oral   Take by mouth.         . Insulin Disposable Pump (V-GO 30) KIT               .  insulin lispro (HUMALOG) 100 UNIT/ML injection   Subcutaneous   Inject into the skin.         Marland Kitchen isosorbide mononitrate (IMDUR) 60 MG 24 hr tablet   Oral   Take by mouth.         . metFORMIN (GLUCOPHAGE) 1000 MG tablet   Oral   Take by mouth.         . nitroGLYCERIN (NITROSTAT) 0.4 MG SL tablet   Sublingual   Place under the tongue.         . primidone (MYSOLINE) 50 MG tablet   Oral   Take 1 tablet by mouth 2 (two) times daily.         . propranolol (INDERAL) 20 MG tablet               . tamsulosin (FLOMAX) 0.4 MG CAPS capsule   Oral   Take by mouth.           Allergies No known drug allergies.  Family History  Problem Relation Age of Onset  . Heart disease Mother   . Heart disease Father   . Diabetes Father   . Stroke Father   . Kidney disease Neg Hx    . Prostate cancer Neg Hx     Social History Social History  Substance Use Topics  . Smoking status: Former Smoker    Quit date: 05/21/2014  . Smokeless tobacco: None  . Alcohol Use: 1.2 oz/week    2 Standard drinks or equivalent per week    Review of Systems  Constitutional: Negative for fever. Eyes: Negative for visual changes. ENT: Negative for sore throat. Cardiovascular: Positive for chest pain. Respiratory: Positive for shortness of breath. Gastrointestinal: Negative for abdominal pain, vomiting and diarrhea. Genitourinary: Negative for dysuria. Musculoskeletal: Negative for back pain. Skin: Negative for rash. Neurological: Negative for headaches, focal weakness or numbness.   10-point ROS otherwise negative.  ____________________________________________   PHYSICAL EXAM:  VITAL SIGNS: ED Triage Vitals  Enc Vitals Group     BP 11/25/14 0252 163/77 mmHg     Pulse Rate 11/25/14 0252 72     Resp 11/25/14 0252 20     Temp 11/25/14 0252 97 F (36.1 C)     Temp Source 11/25/14 0252 Oral     SpO2 11/25/14 0252 96 %     Weight 11/25/14 0252 179 lb (81.194 kg)     Height 11/25/14 0252 5' 9"  (1.753 m)     Head Cir --      Peak Flow --      Pain Score 11/25/14 0255 7     Pain Loc --      Pain Edu? --      Excl. in [place]? --      Constitutional: Alert and oriented. Well appearing and in no distress. Eyes: Conjunctivae are normal. PERRL. Normal extraocular movements. ENT   Head: Normocephalic and atraumatic.   Nose: No congestion/rhinnorhea.   Mouth/Throat: Mucous membranes are moist.   Neck: No stridor. Cardiovascular: Normal rate, regular rhythm. Normal and symmetric distal pulses are present in all extremities. No murmurs, rubs, or gallops. Respiratory: Normal respiratory effort without tachypnea nor retractions. Breath sounds are clear and equal bilaterally. No wheezes/rales/rhonchi. Gastrointestinal: Soft and nontender. No distention. There is no  CVA tenderness. Genitourinary: deferred Musculoskeletal: Nontender with normal range of motion in all extremities. No joint effusions.  No lower extremity tenderness nor edema. Neurologic:  Normal speech and language. No gross focal neurologic deficits  are appreciated. Speech is normal.  Skin:  Skin is warm, dry and intact. No rash noted. Psychiatric: Mood and affect are normal. Speech and behavior are normal. Patient exhibits appropriate insight and judgment.  ____________________________________________    LABS (pertinent positives/negatives)  Labs Reviewed  BASIC METABOLIC PANEL - Abnormal; Notable for the following:    Potassium 3.3 (*)    Chloride 99 (*)    Glucose, Bld 213 (*)    All other components within normal limits  CBC - Abnormal; Notable for the following:    WBC 13.0 (*)    All other components within normal limits  TROPONIN I     ____________________________________________   EKG  ED ECG REPORT I, Edrei Norgaard, [place], the attending physician, personally viewed and interpreted this ECG.   Date: 11/25/2014  EKG Time: 2:49AM  Rate: 70  Rhythm:normal sinus  Axis:none  Intervals:none  ST&T Change: none   ____________________________________________    RADIOLOGY DG Chest Port 1 View (Final result) Result time: 11/25/14 03:21:03   Final result by Rad Results In Interface (11/25/14 03:21:03)   Narrative:   CLINICAL DATA: Mid chest pain and elevated blood pressure since 8 p.m. History of prostate cancer, hypertension, diabetes.  EXAM: PORTABLE CHEST 1 VIEW  COMPARISON: 03/06/2014  FINDINGS: Normal heart size and pulmonary vascularity. No focal airspace disease or consolidation in the lungs. No blunting of costophrenic angles. No pneumothorax. Mediastinal contours appear intact. Degenerative changes in the thoracic spine. Postoperative changes in the cervical spine.  IMPRESSION: No active disease.   Electronically Signed By: Lucienne Capers M.D. On: 11/25/2014 03:21        INITIAL IMPRESSION / ASSESSMENT AND PLAN / ED COURSE  Pertinent labs & imaging results that were available during my care of the patient were reviewed by me and considered in my medical decision making (see chart for details).  Patient with ongoing chest pain despite total of 8 mg morphine IV as such we'll admit the patient to the hospital for further evaluation. Discussed with Dr. Marcille Blanco for hospital admission.  ____________________________________________   FINAL CLINICAL IMPRESSION(S) / ED DIAGNOSES  Final diagnoses:  Chest pain, unspecified chest pain type      Gregor Hams, MD 11/25/14 [phone]

## 2014-11-25 NOTE — Progress Notes (Signed)
Checked in with patient around 10:45 doctor was in with them so I told them I would check back with them later.  Went back at 12:45 p.m.  Spoke to patient and wife. Very nice couple.  Their Doristine Bosworth had came by; visited and prayed with them.  We discussed his situation and I offered spiritual support.  Richard Chapman

## 2014-11-25 NOTE — ED Notes (Signed)
Pt presents to ED via EMS from personal home with c/o of substernal chest pain. EMS states pt experienced presenting sx approximately x2 hrs ago. EMS reports chest pain radiation to left side neck region. EMS states pt self-administered x3 nitroglycerin sublingual tablets, with no relief. EMS states they administered 324 mg of ASA and nitroglycerin paste to right side of chest, with a decreasing pain rating to a 7/10. EMS states pt has a hx of subdural hematoma. Pt arrived to ER alert and oriented x4, able to follow verbal and physical commands appropriately.

## 2014-11-25 NOTE — Progress Notes (Signed)
Patient's chest pain has improved some, but is still there. Given morphine twice. Patient will have stress test tomorrow, education completed and consent signed. No further questions. Sinus rhythm on tele.

## 2014-11-25 NOTE — Progress Notes (Signed)
Patient admitted today from home with his wife. Still complaining of chest pain, given morphine once. Wife states he has had chest pain in the past and it always ends up being a GI issue. IV protonix as ordered was given to relieve symptoms. No other complaints other than chest pain. Patient is a diabetic and wears an insulin pump that has to be changed out every 24 hours. Current attachment on leg was empty, so pump was removed and is no longer being used. Updated MD and stated to check CBG Q4 hours, no insulin orders at this time. Patient is still NPO pending cardiology's input. Will continue to monitor.

## 2014-11-25 NOTE — Progress Notes (Signed)
Inpatient Diabetes Program Recommendations  AACE/ADA: New Consensus Statement on Inpatient Glycemic Control (2015)  Target Ranges:  Prepandial:   less than 140 mg/dL      Peak postprandial:   less than 180 mg/dL (1-2 hours)      Critically ill patients:  140 - 180 mg/dL  Results for Richard Chapman, Richard Chapman (MRN [id number]) as of 11/25/2014 11:56  Ref. Range 11/25/2014 08:21  Glucose-Capillary Latest Ref Range: 65-99 mg/dL 223 (H)  Review of Glycemic Control  Diabetes history: DM2 Outpatient Diabetes medications: V-Go 30 insulin pump with Humalog (provides 30 units of basal insulin over 24 hour period); also uses 3-5 clicks with V-Go pump to administer meal coverage (each click is 2 units; so pt is using 6-10 units with meals for meal coverage, Metformin 1000 mg BID Current orders for Inpatient glycemic control: CBGs Q4H  Inpatient Diabetes Program Recommendations: Insulin - Basal: Please consider ordering Lantus 16 units Q24H starting now (based on 81 kg x 0.2 units). Correction (SSI): Please order CBGs Q4H with Novolog sensitive correction scale.  NOTE: Noted in chart review that patient uses a V-Go 30 insulin pump and is followed by Dr. Eddie Dibbles. Dr. Sammuel Hines last office note was on 05/21/2014. The V-Go pump is a disposable insulin pump which the patient changes out every 24 hours. The V-Go 30 provided 30 units of basal insulin over a 24 hour period and allows patient to give bolus insulin for meal coverage. Patient is prescribed 3-5 clicks with meals which provided 2 units of insulin with each click (therefore, pt is taking 6-10 units with meals). Leah, RN note today at 10:15 notes that the insulin pump was empty and it was removed. Therefore, patient will not be receiving any insulin from the insulin pump at this time. Recommend using SQ insulin injections with basal and Novolog correction at this time while inpatient. Will continue to follow and make further recommendations as more data is  collected.  Thanks, Barnie Alderman, RN, MSN, CCRN, CDE Diabetes Coordinator Inpatient Diabetes Program [phone] (Team Pager from [place] to [place]) [phone] ([place] office) [phone] [facility] office) [phone] [facility] office)

## 2014-11-26 ENCOUNTER — Encounter: Payer: Self-pay | Admitting: Radiology

## 2014-11-26 LAB — NM MYOCAR MULTI W/SPECT W/WALL MOTION / EF
CHL CUP NUCLEAR SRS: 1
CSEPPHR: 82 {beats}/min
LVDIAVOL: 57 mL
LVSYSVOL: 35 mL
NUC STRESS TID: 0.83
Rest HR: 62 {beats}/min
SDS: 0
SSS: 0

## 2014-11-26 LAB — GLUCOSE, CAPILLARY: Glucose-Capillary: 271 mg/dL — ABNORMAL HIGH (ref 65–99)

## 2014-11-26 MED ORDER — FUROSEMIDE 40 MG PO TABS: 40.0000 mg | ORAL_TABLET | Freq: Every day | ORAL | Status: DC

## 2014-11-26 MED ORDER — ESCITALOPRAM OXALATE 20 MG PO TABS: 20.0000 mg | ORAL_TABLET | Freq: Every day | ORAL | Status: AC

## 2014-11-26 MED ORDER — AMLODIPINE BESYLATE 5 MG PO TABS: 5.0000 mg | ORAL_TABLET | Freq: Every day | ORAL | Status: DC

## 2014-11-26 MED ORDER — ISOSORBIDE MONONITRATE ER 60 MG PO TB24: 60.0000 mg | ORAL_TABLET | Freq: Two times a day (BID) | ORAL | Status: DC

## 2014-11-26 MED ORDER — INSULIN GLARGINE 100 UNIT/ML ~~LOC~~ SOLN
20.0000 [IU] | Freq: Every day | SUBCUTANEOUS | Status: DC
  Filled 2014-11-26: qty 0.2

## 2014-11-26 MED ORDER — LABETALOL HCL 5 MG/ML IV SOLN
10.0000 mg | INTRAVENOUS | Status: DC | PRN
  Administered 2014-11-26: 10 mg via INTRAVENOUS
  Filled 2014-11-26: qty 4

## 2014-11-26 MED ORDER — TECHNETIUM TC 99M SESTAMIBI - CARDIOLITE
10.0000 | Freq: Once | INTRAVENOUS | Status: AC | PRN
  Administered 2014-11-26: 11:00:00 13.84 via INTRAVENOUS

## 2014-11-26 MED ORDER — ASPIRIN 81 MG PO TABS: 81.0000 mg | ORAL_TABLET | Freq: Every day | ORAL | Status: DC

## 2014-11-26 MED ORDER — ACETAMINOPHEN 325 MG PO TABS: 650.0000 mg | ORAL_TABLET | Freq: Four times a day (QID) | ORAL | Status: DC | PRN

## 2014-11-26 MED ORDER — METFORMIN HCL 1000 MG PO TABS: 1000.0000 mg | ORAL_TABLET | Freq: Every day | ORAL | Status: DC

## 2014-11-26 MED ORDER — PANTOPRAZOLE SODIUM 40 MG IV SOLR: 40.0000 mg | Freq: Every day | INTRAVENOUS | Status: DC

## 2014-11-26 MED ORDER — INSULIN ASPART 100 UNIT/ML ~~LOC~~ SOLN: 5.0000 [IU] | Freq: Three times a day (TID) | SUBCUTANEOUS | Status: DC

## 2014-11-26 MED ORDER — POTASSIUM CHLORIDE 20 MEQ PO PACK: 40.0000 meq | PACK | Freq: Once | ORAL | Status: DC

## 2014-11-26 MED ORDER — ESOMEPRAZOLE MAGNESIUM 20 MG PO CPDR: 20.0000 mg | DELAYED_RELEASE_CAPSULE | Freq: Every morning | ORAL | Status: DC

## 2014-11-26 MED ORDER — PREDNISONE 10 MG (21) PO TBPK: 10.0000 mg | ORAL_TABLET | Freq: Every day | ORAL | Status: DC

## 2014-11-26 MED ORDER — DOCUSATE SODIUM 100 MG PO CAPS: 100.0000 mg | ORAL_CAPSULE | Freq: Two times a day (BID) | ORAL | Status: DC | PRN

## 2014-11-26 NOTE — Care Management (Signed)
Patient presents from home.  Prior to this admission, patient independent in all adls, denies  Problems accessing medical care or obtaining  medications.

## 2014-11-26 NOTE — Discharge Instructions (Signed)
Activity as tolerated Diet-healthy heart and diabetic Follow-up with primary care physician in a week Follow-up with cardiology in a week

## 2014-11-26 NOTE — Progress Notes (Signed)
Subjective:   presented with chest pain improved from resting quietly ready for Myoview  Objective:  Vital Signs in the last 24 hours: Temp:  [98.3 F (36.8 C)] 98.3 F (36.8 C) (10/12 1918) Pulse Rate:  [65-110] 66 (10/13 1352) Resp:  [20-22] 22 (10/13 0459) BP: (150-186)/(67-88) 153/68 mmHg (10/13 1352) SpO2:  [93 %-98 %] 93 % (10/13 1352) Weight:  [80.32 kg (177 lb 1.2 oz)] 80.32 kg (177 lb 1.2 oz) (10/13 0459)  Intake/Output from previous day: 10/12 0701 - 10/13 0700 In: 240 [P.O.:240] Out: 375 [Urine:375] Intake/Output from this shift: Total I/O In: -  Out: 200 [Urine:200]  Physical Exam: General appearance: cooperative and appears stated age Neck: no adenopathy, no carotid bruit, no JVD, supple, symmetrical, trachea midline and thyroid not enlarged, symmetric, no tenderness/mass/nodules Lungs: clear to auscultation bilaterally Heart: regular rate and rhythm, S1, S2 normal, no murmur, click, rub or gallop Abdomen: soft, non-tender; bowel sounds normal; no masses,  no organomegaly Extremities: extremities normal, atraumatic, no cyanosis or edema Pulses: 2+ and symmetric Skin: Skin color, texture, turgor normal. No rashes or lesions Neurologic: Alert and oriented X 3, normal strength and tone. Normal symmetric reflexes. Normal coordination and gait  Lab Results:  Recent Labs  11/25/14 0300  WBC 13.0*  HGB 14.3  PLT 158    Recent Labs  11/25/14 0300  NA 135  K 3.3*  CL 99*  CO2 26  GLUCOSE 213*  BUN 16  CREATININE 1.13    Recent Labs  11/25/14 0605 11/25/14 1258  TROPONINI <0.03 <0.03   Hepatic Function Panel No results for input(s): PROT, ALBUMIN, AST, ALT, ALKPHOS, BILITOT, BILIDIR, IBILI in the last 72 hours. No results for input(s): CHOL in the last 72 hours. No results for input(s): PROTIME in the last 72 hours.  Imaging: Imaging results have been reviewed  Cardiac Studies:  Assessment/Plan:  Angina Chest Pain Coronary Artery  Disease Shortness of Breath   diabetes  chronic renal insufficiency stage III  GERD  hypertension . PLAN  agree with telemetry  recommend Myoview for evaluation of ischemia  agree with echocardiogram for LV function  continue diabetes management  referred to Nephrology for renal insufficiency  continue hypertension control  continue lipid management with Lipitor  consider increasing Imdur dose  if Myoview negative outpatient follow-up with Cardiology as an outpatient     Bryson Palen D. 11/26/2014, 5:55 PM

## 2014-11-26 NOTE — Progress Notes (Signed)
Pt's BP still elevated at midnight check.  MD, Dr. Jannifer [place] notified.  MD to place order for labetalol.  Will continue to monitor. Jessee Avers

## 2014-11-26 NOTE — Progress Notes (Signed)
Inpatient Diabetes Program Recommendations  AACE/ADA: New Consensus Statement on Inpatient Glycemic Control (2015)  Target Ranges:  Prepandial:   less than 140 mg/dL      Peak postprandial:   less than 180 mg/dL (1-2 hours)      Critically ill patients:  140 - 180 mg/dL  Results for BARACK, NICODEMUS (MRN [id number]) as of 11/26/2014 08:40  Ref. Range 11/25/2014 08:21 11/25/2014 12:27 11/25/2014 16:50 11/25/2014 21:26 11/26/2014 07:43  Glucose-Capillary Latest Ref Range: 65-99 mg/dL 223 (H) 227 (H) 284 (H) 363 (H) 271 (H)   Review of Glycemic Control  Diabetes history: DM2 Outpatient Diabetes medications: V-Go 30 insulin pump with Humalog (provides 30 units of basal insulin over 24 hour period); also uses 3-5 clicks with V-Go pump to administer meal coverage (each click is 2 units; so pt is using 6-10 units with meals for meal coverage, Metformin 1000 mg BID Current orders for Inpatient glycemic control: Lantus 16 units daily (at 22:00), Novolog 0-9 units TID with meals, Novolog 0-5 units HS   Inpatient Diabetes Program Recommendations: Insulin - Basal: Please consider increasing Lantus to 20 units QHS. Correction (SSI): Please consider increasing Novolog correction to moderate scale. Insulin - Meal Coverage: Note patient is NPO this morning for procedure. Once diet is resumed, please consider ordering Novolog 5 units TID with meals for meal coverage (in addition to Novolog correction scale).  Thanks, Barnie Alderman, RN, MSN, CCRN, CDE Diabetes Coordinator Inpatient Diabetes Program [phone] (Team Pager from [place] to [place]) [phone] ([place] office) [phone] [facility] office) [phone] [facility] office)

## 2014-11-26 NOTE — Progress Notes (Signed)
D?C forms completed and explained to pt as per MD orders. Verbalizes understanding. Advise pt and wife to call PCP if any questions or concerns to call his PCP. ALL personal belongings returned to pt . IV and telemetry d/c

## 2014-11-27 ENCOUNTER — Other Ambulatory Visit: Payer: Self-pay | Admitting: Neurology

## 2014-11-27 DIAGNOSIS — M542 Cervicalgia: Secondary | ICD-10-CM

## 2014-11-27 DIAGNOSIS — S065XAA Traumatic subdural hemorrhage with loss of consciousness status unknown, initial encounter: Secondary | ICD-10-CM

## 2014-11-27 DIAGNOSIS — S065X9A Traumatic subdural hemorrhage with loss of consciousness of unspecified duration, initial encounter: Secondary | ICD-10-CM

## 2014-12-03 DIAGNOSIS — M5090 Cervical disc disorder, unspecified, unspecified cervical region: Secondary | ICD-10-CM | POA: Insufficient documentation

## 2014-12-06 NOTE — Discharge Summary (Signed)
[facility] [place] [facility] NAME: Richard Chapman    MR#:  [id number]  DATE OF BIRTH:  1940/03/21  DATE OF ADMISSION:  11/25/2014 ADMITTING PHYSICIAN: Hillary Bow, MD  DATE OF DISCHARGE: 11/26/2014  6:01 PM  PRIMARY CARE PHYSICIAN: Adrian Prows, MD    ADMISSION DIAGNOSIS:  Chest pain, unspecified chest pain type [R07.9]  DISCHARGE DIAGNOSIS:  Active Problems:   Chest pain   SECONDARY DIAGNOSIS:   Past Medical History  Diagnosis Date  . History of nephrolithiasis   . GERD (gastroesophageal reflux disease)   . Anxiety and depression   . Skin cancer   . Depression   . Diabetes ([place])   . Hypertension   . Sleep apnea   . Elevated PSA     HOSPITAL COURSE:  * Chest pain without radiation Patient's cardiac enzymes have been normal. He does have risk factors with prior history of CAD. Requested cardiology consult , pt had myoview stress test , nml Continue aspirin, statin, beta blocker. Treatment monitoring. Nitroglycerin when necessary. 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  * Hypertension Continue home medications  * GERD PPI  * Insulin-dependent diabetes mellitus Patient wears a 24-hour disposable pump. At this point his pump is empty. Given 16 units Lantus and  Slidi. He needs to resume his insulin pump after discharge.  * Bronchitis Started prednisone,inhalers  * DVT prophylaxis with Lovenox   DISCHARGE CONDITIONS:   fair  CONSULTS OBTAINED:  Treatment Team:  Yolonda Kida, MD Nicholes Mango, MD   PROCEDURES myoview - neg  DRUG ALLERGIES:  No Known Allergies  DISCHARGE MEDICATIONS:   Discharge Medication List as of 11/26/2014  5:22 PM    START taking these medications   Details  acetaminophen (TYLENOL) 325 MG tablet Take 2 tablets (650 mg total) by mouth every 6 (six) hours as needed for mild pain (or Fever >/= 101)., Starting  11/26/2014, Until Discontinued, OTC    docusate sodium (COLACE) 100 MG capsule Take 1 capsule (100 mg total) by mouth 2 (two) times daily as needed for mild constipation., Starting 11/26/2014, Until Discontinued, Normal    predniSONE (STERAPRED UNI-PAK 21 TAB) 10 MG (21) TBPK tablet Take 1 tablet (10 mg total) by mouth daily. Take 6 tablets by mouth for 1 day followed by  5 tablets by mouth for 1 day followed by  4 tablets by mouth for 1 day followed by  3 tablets by mouth for 1 day followed by  2 tablets by mouth for 1 day foll owed by  1 tablet by mouth for a day and stop, Starting 11/26/2014, Until Discontinued, Print      CONTINUE these medications which have CHANGED   Details  amLODipine (NORVASC) 5 MG tablet Take 1 tablet (5 mg total) by mouth daily., Starting 11/26/2014, Until Discontinued, Normal    aspirin 81 MG tablet Take 1 tablet (81 mg total) by mouth daily., Starting 11/26/2014, Until Discontinued, Normal    escitalopram (LEXAPRO) 20 MG tablet Take 1 tablet (20 mg total) by mouth daily., Starting 11/26/2014, Until Discontinued, Normal    esomeprazole (NEXIUM) 20 MG capsule Take 1 capsule (20 mg total) by mouth AC breakfast., Starting 11/26/2014, Until Discontinued, Print    furosemide (LASIX) 40 MG tablet Take 1 tablet (40 mg total) by mouth daily., Starting 11/26/2014, Until Discontinued, Normal    isosorbide mononitrate (IMDUR) 60 MG 24 hr tablet Take 1  tablet (60 mg total) by mouth 2 (two) times daily., Starting 11/26/2014, Until Discontinued, Normal    metFORMIN (GLUCOPHAGE) 1000 MG tablet Take 1 tablet (1,000 mg total) by mouth daily with breakfast., Starting 11/28/2014, Until Discontinued, Print      CONTINUE these medications which have NOT CHANGED   Details  atorvastatin (LIPITOR) 10 MG tablet Take 5 mg by mouth daily., Until Discontinued, Historical Med    gabapentin (NEURONTIN) 100 MG capsule Take by mouth., Until Discontinued, Historical Med     HYDROcodone-acetaminophen (NORCO/VICODIN) 5-325 MG tablet Take 1 tablet by mouth every 6 (six) hours as needed for moderate pain., Until Discontinued, Historical Med    Insulin Disposable Pump (V-GO 30) KIT Starting 07/27/2014, Until Discontinued, Historical Med    insulin lispro (HUMALOG) 100 UNIT/ML injection Inject into the skin., Until Discontinued, Historical Med    nitroGLYCERIN (NITROSTAT) 0.4 MG SL tablet Place 0.4 mg under the tongue every 5 (five) minutes as needed for chest pain. , Until Discontinued, Historical Med    primidone (MYSOLINE) 50 MG tablet Take 1 tablet by mouth 2 (two) times daily., Starting 11/04/2014, Until Discontinued, Historical Med    propranolol (INDERAL) 20 MG tablet Take 20 mg by mouth 2 (two) times daily. , Until Discontinued, Historical Med    tamsulosin (FLOMAX) 0.4 MG CAPS capsule Take by mouth., Starting 05/21/2014, Until Discontinued, Historical Med         DISCHARGE INSTRUCTIONS:   F/u with PCP and cardio as recommended  DIET:  Aha, diabetic  DISCHARGE CONDITION:  fair  ACTIVITY:  As tolerated OXYGEN:  Home Oxygen: no   Oxygen Delivery: no  DISCHARGE LOCATION:  home  If you experience worsening of your admission symptoms, develop shortness of breath, life threatening emergency, suicidal or homicidal thoughts you must seek medical attention immediately by calling 911 or calling your MD immediately  if symptoms less severe.  You Must read complete instructions/literature along with all the possible adverse reactions/side effects for all the Medicines you take and that have been prescribed to you. Take any new Medicines after you have completely understood and accpet all the possible adverse reactions/side effects.   Please note  You were cared for by a hospitalist during your hospital stay. If you have any questions about your discharge medications or the care you received while you were in the hospital after you are discharged, you can  call the unit and asked to speak with the hospitalist on call if the hospitalist that took care of you is not available. Once you are discharged, your primary care physician will handle any further medical issues. Please note that NO REFILLS for any discharge medications will be authorized once you are discharged, as it is imperative that you return to your primary care physician (or establish a relationship with a primary care physician if you do not have one) for your aftercare needs so that they can reassess your need for medications and monitor your lab values.     Today  Chief Complaint  Patient presents with  . Chest Pain    Pt 's chest pain improved. No other complaints ROS:  CONSTITUTIONAL: Denies fevers, chills. Denies any fatigue, weakness.  EYES: Denies blurry vision, double vision, eye pain. EARS, NOSE, THROAT: Denies tinnitus, ear pain, hearing loss. RESPIRATORY: Denies cough, wheeze, shortness of breath.  CARDIOVASCULAR: Denies chest pain, palpitations, edema.  GASTROINTESTINAL: Denies nausea, vomiting, diarrhea, abdominal pain. Denies bright red blood per rectum. GENITOURINARY: Denies dysuria, hematuria. ENDOCRINE: Denies nocturia or  thyroid problems. HEMATOLOGIC AND LYMPHATIC: Denies easy bruising or bleeding. SKIN: Denies rash or lesion. MUSCULOSKELETAL: Denies pain in neck, back, shoulder, knees, hips or arthritic symptoms.  NEUROLOGIC: Denies paralysis, paresthesias.  PSYCHIATRIC: Denies anxiety or depressive symptoms.   VITAL SIGNS:  Blood pressure 153/68, pulse 66, temperature 98.3 F (36.8 C), temperature source Oral, resp. rate 22, height _0  (1.753 m), weight 80.32 kg (177 lb 1.2 oz), SpO2 93 %.  I/O:  No intake or output data in the 24 hours ending 12/06/14 2220  PHYSICAL EXAMINATION:  GENERAL:  74 y.o.-year-old patient lying in the bed with no acute distress.  EYES: Pupils equal, round, reactive to light and accommodation. No scleral icterus.  Extraocular muscles intact.  HEENT: Head atraumatic, normocephalic. Oropharynx and nasopharynx clear.  NECK:  Supple, no jugular venous distention. No thyroid enlargement, no tenderness.  LUNGS: Normal breath sounds bilaterally, no wheezing, rales,rhonchi or crepitation. No use of accessory muscles of respiration.  CARDIOVASCULAR: S1, S2 normal. No murmurs, rubs, or gallops.  ABDOMEN: Soft, non-tender, non-distended. Bowel sounds present. No organomegaly or mass.  EXTREMITIES: No pedal edema, cyanosis, or clubbing.  NEUROLOGIC: Cranial nerves II through XII are intact. Muscle strength 5/5 in all extremities. Sensation intact. Gait not checked.  PSYCHIATRIC: The patient is alert and oriented x 3.  SKIN: No obvious rash, lesion, or ulcer.   DATA REVIEW:   CBC No results for input(s): WBC, HGB, HCT, PLT in the last 168 hours.  Chemistries  No results for input(s): NA, K, CL, CO2, GLUCOSE, BUN, CREATININE, CALCIUM, MG, AST, ALT, ALKPHOS, BILITOT in the last 168 hours.  Invalid input(s): GFRCGP  Cardiac Enzymes No results for input(s): TROPONINI in the last 168 hours.  Microbiology Results  Results for orders placed or performed in visit on 10/23/14  Microscopic Examination     Status: None   Collection Time: 10/23/14 10:58 AM  Result Value Ref Range Status   WBC, UA 0-5 0 -  5 /hpf Final   RBC, UA 0-2 0 -  2 /hpf Final   Epithelial Cells (non renal) None seen 0 - 10 /hpf Final   Bacteria, UA None seen None seen/Few Final    RADIOLOGY:  No results found.  EKG:   Orders placed or performed during the hospital encounter of 11/25/14  . EKG 12-Lead  . EKG 12-Lead  . ED EKG  . ED EKG      Management plans discussed with the patient, family and they are in agreement.  CODE STATUS:  Advance Directive Documentation        Most Recent Value   Type of Advance Directive  Living will   Pre-existing out of facility DNR order (yellow form or pink MOST form)     "MOST" Form in  Place?        TOTAL TIME TAKING CARE OF THIS PATIENT: 45 minutes.    _1 @  on 12/06/2014 at 10:20 PM  Between 7am to 6pm - Pager - [phone]  After 6pm go to [email or web address] - password [email or web address]  [facility]  Office  [phone]  CC: Primary care physician; Adrian Prows, MD

## 2014-12-09 ENCOUNTER — Ambulatory Visit
Admission: RE | Admit: 2014-12-09 | Discharge: 2014-12-09 | Disposition: A | Discharge status: Home / Self Care | Payer: Medicare Other | Source: Ambulatory Visit | Attending: Neurology | Admitting: Neurology

## 2014-12-09 DIAGNOSIS — E049 Nontoxic goiter, unspecified: Secondary | ICD-10-CM | POA: Diagnosis not present

## 2014-12-09 DIAGNOSIS — M542 Cervicalgia: Secondary | ICD-10-CM

## 2014-12-09 DIAGNOSIS — S065XAA Traumatic subdural hemorrhage with loss of consciousness status unknown, initial encounter: Secondary | ICD-10-CM

## 2014-12-09 DIAGNOSIS — R51 Headache: Secondary | ICD-10-CM | POA: Diagnosis present

## 2014-12-09 DIAGNOSIS — I63549 Cerebral infarction due to unspecified occlusion or stenosis of unspecified cerebellar artery: Secondary | ICD-10-CM | POA: Insufficient documentation

## 2014-12-09 DIAGNOSIS — I6200 Nontraumatic subdural hemorrhage, unspecified: Secondary | ICD-10-CM | POA: Diagnosis present

## 2014-12-09 DIAGNOSIS — S065X9A Traumatic subdural hemorrhage with loss of consciousness of unspecified duration, initial encounter: Secondary | ICD-10-CM

## 2014-12-09 DIAGNOSIS — I679 Cerebrovascular disease, unspecified: Secondary | ICD-10-CM | POA: Insufficient documentation

## 2014-12-09 DIAGNOSIS — M4802 Spinal stenosis, cervical region: Secondary | ICD-10-CM | POA: Insufficient documentation

## 2014-12-09 NOTE — Discharge Summary (Signed)
[facility] [place] NAME: Richard Chapman    MR#:  [id number]  DATE OF BIRTH:  76/02/1940  DATE OF ADMISSION:  11/25/2014 ADMITTING PHYSICIAN: Hillary Bow, MD  DATE OF DISCHARGE: 11/26/2014  6:01 PM  PRIMARY CARE PHYSICIAN: Adrian Prows, MD    ADMISSION DIAGNOSIS:  Chest pain, unspecified chest pain type [R07.9]  DISCHARGE DIAGNOSIS:  Active Problems:   Chest pain   SECONDARY DIAGNOSIS:   Past Medical History  Diagnosis Date  . History of nephrolithiasis   . GERD (gastroesophageal reflux disease)   . Anxiety and depression   . Skin cancer   . Depression   . Diabetes ([place])   . Hypertension   . Sleep apnea   . Elevated PSA     HOSPITAL COURSE:   * Chest pain without radiation Patient's cardiac enzymes have been normal. He does have risk factors with prior history of CAD. Continue aspirin, statin, beta blocker. Cardiology has recommended Myoview stress test, which was done on 11/26/2014  There was no ST segment deviation noted during stress.  Blood pressure demonstrated a blunted response to exercise.  No T wave inversion was noted during stress.  normal myocardial perfusion scan  no evidence of ischemia with this Lexis scan Myoview  ejection fraction of 57%  recommend medical therapy for now. Increased Imdur to 60 mg twice a day as recommended by Dr. Clayborn Bigness.  Patient is to follow up with outpatient cardiology Treatment monitoring. Nitroglycerin when necessary. 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  * Hypertension Continue home medications  * GERD PPI  * Insulin-dependent diabetes mellitus Patient wears a 24-hour disposable pump. At this point his pump is empty. Provided 16 units Lantus during hospital course.. Sliding scale insulin.  He needs to resume his insulin pump after discharge.  * Bronchitis Started prednisone  * DVT  prophylaxis provided with Lovenox  DISCHARGE CONDITIONS:   Stable   CONSULTS OBTAINED:  Treatment Team:  Yolonda Kida, MD Nicholes Mango, MD   PROCEDURES Myoview stress test on 11/26/2014  DRUG ALLERGIES:  No Known Allergies  DISCHARGE MEDICATIONS:   Discharge Medication List as of 11/26/2014  5:22 PM    START taking these medications   Details  acetaminophen (TYLENOL) 325 MG tablet Take 2 tablets (650 mg total) by mouth every 6 (six) hours as needed for mild pain (or Fever >/= 101)., Starting 11/26/2014, Until Discontinued, OTC    docusate sodium (COLACE) 100 MG capsule Take 1 capsule (100 mg total) by mouth 2 (two) times daily as needed for mild constipation., Starting 11/26/2014, Until Discontinued, Normal    predniSONE (STERAPRED UNI-PAK 21 TAB) 10 MG (21) TBPK tablet Take 1 tablet (10 mg total) by mouth daily. Take 6 tablets by mouth for 1 day followed by  5 tablets by mouth for 1 day followed by  4 tablets by mouth for 1 day followed by  3 tablets by mouth for 1 day followed by  2 tablets by mouth for 1 day foll owed by  1 tablet by mouth for a day and stop, Starting 11/26/2014, Until Discontinued, Print      CONTINUE these medications which have CHANGED   Details  amLODipine (NORVASC) 5 MG tablet Take 1 tablet (5 mg total) by mouth daily., Starting 11/26/2014, Until Discontinued, Normal    aspirin 81 MG tablet Take 1 tablet (81 mg total) by mouth daily., Starting  11/26/2014, Until Discontinued, Normal    escitalopram (LEXAPRO) 20 MG tablet Take 1 tablet (20 mg total) by mouth daily., Starting 11/26/2014, Until Discontinued, Normal    esomeprazole (NEXIUM) 20 MG capsule Take 1 capsule (20 mg total) by mouth AC breakfast., Starting 11/26/2014, Until Discontinued, Print    furosemide (LASIX) 40 MG tablet Take 1 tablet (40 mg total) by mouth daily., Starting 11/26/2014, Until Discontinued, Normal    isosorbide mononitrate (IMDUR) 60 MG 24 hr tablet Take 1 tablet  (60 mg total) by mouth 2 (two) times daily., Starting 11/26/2014, Until Discontinued, Normal    metFORMIN (GLUCOPHAGE) 1000 MG tablet Take 1 tablet (1,000 mg total) by mouth daily with breakfast., Starting 11/28/2014, Until Discontinued, Print      CONTINUE these medications which have NOT CHANGED   Details  atorvastatin (LIPITOR) 10 MG tablet Take 5 mg by mouth daily., Until Discontinued, Historical Med    gabapentin (NEURONTIN) 100 MG capsule Take by mouth., Until Discontinued, Historical Med    HYDROcodone-acetaminophen (NORCO/VICODIN) 5-325 MG tablet Take 1 tablet by mouth every 6 (six) hours as needed for moderate pain., Until Discontinued, Historical Med    Insulin Disposable Pump (V-GO 30) KIT Starting 07/27/2014, Until Discontinued, Historical Med    insulin lispro (HUMALOG) 100 UNIT/ML injection Inject into the skin., Until Discontinued, Historical Med    nitroGLYCERIN (NITROSTAT) 0.4 MG SL tablet Place 0.4 mg under the tongue every 5 (five) minutes as needed for chest pain. , Until Discontinued, Historical Med    primidone (MYSOLINE) 50 MG tablet Take 1 tablet by mouth 2 (two) times daily., Starting 11/04/2014, Until Discontinued, Historical Med    propranolol (INDERAL) 20 MG tablet Take 20 mg by mouth 2 (two) times daily. , Until Discontinued, Historical Med    tamsulosin (FLOMAX) 0.4 MG CAPS capsule Take by mouth., Starting 05/21/2014, Until Discontinued, Historical Med         DISCHARGE INSTRUCTIONS:   Follow-up with primary care physician in a week Follow-up with cardiology in a week   DIET:  Diabetic diet, cardiac  DISCHARGE CONDITION:  Stable  ACTIVITY:  Activity as tolerated  OXYGEN:  Home Oxygen: No.   Oxygen Delivery: room air  DISCHARGE LOCATION:  home   If you experience worsening of your admission symptoms, develop shortness of breath, life threatening emergency, suicidal or homicidal thoughts you must seek medical attention immediately by calling  911 or calling your MD immediately  if symptoms less severe.  You Must read complete instructions/literature along with all the possible adverse reactions/side effects for all the Medicines you take and that have been prescribed to you. Take any new Medicines after you have completely understood and accpet all the possible adverse reactions/side effects.   Please note  You were cared for by a hospitalist during your hospital stay. If you have any questions about your discharge medications or the care you received while you were in the hospital after you are discharged, you can call the unit and asked to speak with the hospitalist on call if the hospitalist that took care of you is not available. Once you are discharged, your primary care physician will handle any further medical issues. Please note that NO REFILLS for any discharge medications will be authorized once you are discharged, as it is imperative that you return to your primary care physician (or establish a relationship with a primary care physician if you do not have one) for your aftercare needs so that they can reassess your need for  medications and monitor your lab values.     Today  Chief Complaint  Patient presents with  . Chest Pain   Patient's chest pain significantly improved. Had a Myoview stress test which was normal. Comfortable to go home  ROS:  CONSTITUTIONAL: Denies fevers, chills. Denies any fatigue, weakness.  EYES: Denies blurry vision, double vision, eye pain. EARS, NOSE, THROAT: Denies tinnitus, ear pain, hearing loss. RESPIRATORY: Denies cough, wheeze, shortness of breath.  CARDIOVASCULAR: Denies chest pain, palpitations, edema.  GASTROINTESTINAL: Denies nausea, vomiting, diarrhea, abdominal pain. Denies bright red blood per rectum. GENITOURINARY: Denies dysuria, hematuria. ENDOCRINE: Denies nocturia or thyroid problems. HEMATOLOGIC AND LYMPHATIC: Denies easy bruising or bleeding. SKIN: Denies rash or  lesion. MUSCULOSKELETAL: Denies pain in neck, back, shoulder, knees, hips or arthritic symptoms.  NEUROLOGIC: Denies paralysis, paresthesias.  PSYCHIATRIC: Denies anxiety or depressive symptoms.   VITAL SIGNS:  Blood pressure 153/68, pulse 66, temperature 98.3 F (36.8 C), temperature source Oral, resp. rate 22, height 5' 9"  (1.753 m), weight 80.32 kg (177 lb 1.2 oz), SpO2 93 %.  I/O:  No intake or output data in the 24 hours ending 12/09/14 1225  PHYSICAL EXAMINATION:  GENERAL:  74 y.o.-year-old patient lying in the bed with no acute distress.  EYES: Pupils equal, round, reactive to light and accommodation. No scleral icterus. Extraocular muscles intact.  HEENT: Head atraumatic, normocephalic. Oropharynx and nasopharynx clear.  NECK:  Supple, no jugular venous distention. No thyroid enlargement, no tenderness.  LUNGS: Normal breath sounds bilaterally, no wheezing, rales,rhonchi or crepitation. No use of accessory muscles of respiration.  CARDIOVASCULAR: S1, S2 normal. No murmurs, rubs, or gallops.  ABDOMEN: Soft, non-tender, non-distended. Bowel sounds present. No organomegaly or mass.  EXTREMITIES: No pedal edema, cyanosis, or clubbing.  NEUROLOGIC: Cranial nerves II through XII are intact. Muscle strength 5/5 in all extremities. Sensation intact. Gait not checked.  PSYCHIATRIC: The patient is alert and oriented x 3.  SKIN: No obvious rash, lesion, or ulcer.   DATA REVIEW:   CBC No results for input(s): WBC, HGB, HCT, PLT in the last 168 hours.  Chemistries  No results for input(s): NA, K, CL, CO2, GLUCOSE, BUN, CREATININE, CALCIUM, MG, AST, ALT, ALKPHOS, BILITOT in the last 168 hours.  Invalid input(s): GFRCGP  Cardiac Enzymes No results for input(s): TROPONINI in the last 168 hours.  Microbiology Results  Results for orders placed or performed in visit on 10/23/14  Microscopic Examination     Status: None   Collection Time: 10/23/14 10:58 AM  Result Value Ref Range  Status   WBC, UA 0-5 0 -  5 /hpf Final   RBC, UA 0-2 0 -  2 /hpf Final   Epithelial Cells (non renal) None seen 0 - 10 /hpf Final   Bacteria, UA None seen None seen/Few Final    RADIOLOGY:  No results found.  EKG:   Orders placed or performed during the hospital encounter of 11/25/14  . EKG 12-Lead  . EKG 12-Lead  . ED EKG  . ED EKG      Management plans discussed with the patient, family and they are in agreement.  CODE STATUS:  Advance Directive Documentation        Most Recent Value   Type of Advance Directive  Living will   Pre-existing out of facility DNR order (yellow form or pink MOST form)     "MOST" Form in Place?        TOTAL TIME TAKING CARE OF THIS PATIENT: 53  minutes.    @[facility] @  on 12/09/2014 at 12:25 PM  Between 7am to 6pm - Pager - [phone]  After 6pm go to [email or web address] - password [email or web address]  [place] [facility]  Office  [phone]  CC: Primary care physician; Adrian Prows, MD

## 2015-01-29 ENCOUNTER — Encounter: Payer: Self-pay | Admitting: Urology

## 2015-01-29 ENCOUNTER — Ambulatory Visit (INDEPENDENT_AMBULATORY_CARE_PROVIDER_SITE_OTHER): Payer: Medicare Other | Admitting: Urology

## 2015-01-29 VITALS — BP 172/70 | HR 65 | Ht 69.0 in | Wt 177.4 lb

## 2015-01-29 DIAGNOSIS — N3941 Urge incontinence: Secondary | ICD-10-CM | POA: Diagnosis not present

## 2015-01-29 DIAGNOSIS — C61 Malignant neoplasm of prostate: Secondary | ICD-10-CM | POA: Insufficient documentation

## 2015-01-29 LAB — BLADDER SCAN AMB NON-IMAGING

## 2015-01-29 NOTE — Progress Notes (Signed)
01/29/2015 10:11 AM   Richard Chapman 1940-04-08 [id number]  Referring provider: Adrian Prows, MD [place],  [id number]  Chief Complaint  Patient presents with  . Prostate Cancer    32monthw/PSA & DRE    HPI: 74year old male with elevated PSA to 9.1 dx 09/2014 with Gleason 3+3 in 2 cores involving the right apex, up to 23% of tissue.  Rectal exam was benign. TRUS volume 60 cc.   He is on active surveillance and returns today for rectal exam and PSA.  He does have urinary frequency and urgency which is fairly significant.. He now wear depends for both urge incontinence as well as fecal incontinence (proving since last visit).  He has tried Mybetriq 25 mg as well as Vesicare 5 mg and had no benefit from either of these medications. He does admit to drinking a lot of coffee, tea, and sodas.  Postvoid residual was elevated at last visit, 170s, improved today.    He did have a recent neurological workup including a brain MRI which shows old lacunar infarcts.  He has no known history of strokes.  He also has severe baseline ED.     PMH: Past Medical History  Diagnosis Date  . History of nephrolithiasis   . GERD (gastroesophageal reflux disease)   . Anxiety and depression   . Skin cancer   . Depression   . [place]   . Hypertension   . Sleep apnea   . Elevated PSA     Surgical History: Past Surgical History  Procedure Laterality Date  . Tonsillectomy    . Knee surgery Bilateral   . Shoulder surgery    . Back surgery    . Neck surgery    . Appendectomy    . Wrist surgery      Home Medications:    Medication List       This list is accurate as of: 01/29/15 10:11 AM.  Always use your most recent med list.               acetaminophen 325 MG tablet  Commonly known as:  TYLENOL  Take 2 tablets (650 mg total) by mouth every 6 (six) hours as needed for mild pain (or Fever >/= 101).     amLODipine 5 MG tablet  Commonly known as:  NORVASC  Take 1 tablet (5 mg total) by mouth daily.     aspirin 81 MG tablet  Take 1 tablet (81 mg total) by mouth daily.     atorvastatin 10 MG tablet  Commonly known as:  LIPITOR  Take 5 mg by mouth daily.     docusate sodium 100 MG capsule  Commonly known as:  COLACE  Take 1 capsule (100 mg total) by mouth 2 (two) times daily as needed for mild constipation.     escitalopram 20 MG tablet  Commonly known as:  LEXAPRO  Take 1 tablet (20 mg total) by mouth daily.     esomeprazole 20 MG capsule  Commonly known as:  NEXIUM  Take 1 capsule (20 mg total) by mouth AC breakfast.     furosemide 40 MG tablet  Commonly known as:  LASIX  Take 1 tablet (40 mg total) by mouth daily.     gabapentin 100 MG capsule  Commonly known as:  NEURONTIN  Take by mouth.     HUMALOG 100 UNIT/ML injection  Generic drug:  insulin lispro  Inject  into the skin.     HYDROcodone-acetaminophen 5-325 MG tablet  Commonly known as:  NORCO/VICODIN  Take 1 tablet by mouth every 6 (six) hours as needed for moderate pain.     isosorbide mononitrate 60 MG 24 hr tablet  Commonly known as:  IMDUR  Take 1 tablet (60 mg total) by mouth 2 (two) times daily.     magnesium 30 MG tablet  Take 30 mg by mouth 2 (two) times daily.     metFORMIN 1000 MG tablet  Commonly known as:  GLUCOPHAGE  Take 1 tablet (1,000 mg total) by mouth daily with breakfast.     nitroGLYCERIN 0.4 MG SL tablet  Commonly known as:  NITROSTAT  Place 0.4 mg under the tongue every 5 (five) minutes as needed for chest pain.     primidone 50 MG tablet  Commonly known as:  MYSOLINE  Take 1 tablet by mouth 2 (two) times daily.     propranolol 20 MG tablet  Commonly known as:  INDERAL  Take 20 mg by mouth 2 (two) times daily.     tamsulosin 0.4 MG Caps capsule  Commonly known as:  FLOMAX  Take by mouth.     V-GO 30 Kit        Allergies: No Known Allergies  Family History: Family  History  Problem Relation Age of Onset  . Heart disease Mother   . Heart disease Father   . Diabetes Father   . Stroke Father   . Kidney disease Neg Hx   . Prostate cancer Neg Hx     Social History:  reports that he quit smoking about 8 months ago. He does not have any smokeless tobacco history on file. He reports that he drinks about 1.2 oz of alcohol per week. He reports that he does not use illicit drugs.  ROS: UROLOGY Frequent Urination?: Yes Hard to postpone urination?: Yes Burning/pain with urination?: No Get up at night to urinate?: Yes Leakage of urine?: Yes Urine stream starts and stops?: No Trouble starting stream?: No Do you have to strain to urinate?: No Blood in urine?: No Urinary tract infection?: No Sexually transmitted disease?: No Injury to kidneys or bladder?: No Painful intercourse?: No Weak stream?: No Erection problems?: Yes Penile pain?: No  Gastrointestinal Nausea?: No Vomiting?: No Indigestion/heartburn?: No Diarrhea?: No Constipation?: No  Constitutional Fever: No Night sweats?: No Weight loss?: No Fatigue?: No  Skin Skin rash/lesions?: No Itching?: No  Eyes Blurred vision?: No Double vision?: No  Ears/Nose/Throat Sore throat?: No Sinus problems?: No  Hematologic/Lymphatic Swollen glands?: No Easy bruising?: Yes  Cardiovascular Leg swelling?: No Chest pain?: No  Respiratory Cough?: No Shortness of breath?: No  Endocrine Excessive thirst?: No  Musculoskeletal Back pain?: No Joint pain?: No  Neurological Headaches?: No Dizziness?: No  Psychologic Depression?: No Anxiety?: No  Physical Exam: BP 172/70 mmHg  Pulse 65  Ht 5' 9"  (1.753 m)  Wt 177 lb 6.4 oz (80.468 kg)  BMI 26.19 kg/m2  Constitutional:  Alert and oriented, No acute distress.  Somewhat frail-appearing, ambulating with cane. HEENT: [place] AT, moist mucus membranes.  Trachea midline, no masses. Cardiovascular: No clubbing, cyanosis, or  edema. Respiratory: Normal respiratory effort, no increased work of breathing. GI: Abdomen is soft, nontender, nondistended, no abdominal masses GU: No CVA tenderness.  Rectal exam: Normal sphincter tone. Enlarged 50+ cc prostate, rubbery, nontender, no masses. Neurologic: Grossly intact, no focal deficits, moving all 4 extremities. Psychiatric: Normal mood and affect.  Laboratory Data:  Lab Results  Component Value Date   WBC 13.0* 11/25/2014   HGB 14.3 11/25/2014   HCT 41.9 11/25/2014   MCV 92.6 11/25/2014   PLT 158 11/25/2014    Lab Results  Component Value Date   CREATININE 1.13 11/25/2014    Pertinent Imaging: n/a  Assessment & Plan:    1. Prostate cancer ([place]) On active surveillance. Rectal exam unremarkable today, PSA repeated. Recommend continuation of every 4 monthly PSA/teary and consideration of biopsy at 1 year. - PSA - Urinalysis, Complete  2. Urge incontinence Significant baseline urinary urgency and urge incontinence. Today, we had a lengthy discussion about behavioral modification including avoidance of irritating beverages. Residual today is normal without concern for urinary retention. His previously failed Vesicare and Mybetriq 25 mg. We'll try him on a higher dose of Mybetriq to see if this helps at all. We briefly discussed InterStim, Botox and other various treatment options. He would like to avoid any surgical intervention if possible. -2 weeks samples of Mybetriq 50 mg given, patient will call to let us know if this is helpful - BLADDER SCAN AMB NON-IMAGING   Return in about 4 months (around 05/30/2015) for PSA/ DRE.  Hollice Espy, MD  [facility] [address] [place], [place] [address] [phone]

## 2015-01-30 LAB — PSA: Prostate Specific Ag, Serum: 11.4 ng/mL — ABNORMAL HIGH (ref 0.0–4.0)

## 2015-02-02 ENCOUNTER — Telehealth: Payer: Self-pay

## 2015-02-02 DIAGNOSIS — R972 Elevated prostate specific antigen [PSA]: Secondary | ICD-10-CM

## 2015-02-02 NOTE — Telephone Encounter (Signed)
-----   Message from Hollice Espy, MD sent at 02/02/2015  8:43 AM EST ----- PSA up slightly to 11.4.  Will continue to monitor with repeat PSA in 4 months as planned.  If up again at that visit, will pursue repeat biopsy sooner.  Please let him know.    Hollice Espy, MD

## 2015-02-02 NOTE — Telephone Encounter (Signed)
Spoke with pt wife, [place], in reference to PSA results. Pt will have labs 06/02/15, prior to office visit. Wife voiced understanding. Orders placed.

## 2015-03-30 ENCOUNTER — Other Ambulatory Visit: Payer: Self-pay | Admitting: Internal Medicine

## 2015-03-30 DIAGNOSIS — E079 Disorder of thyroid, unspecified: Secondary | ICD-10-CM

## 2015-04-05 ENCOUNTER — Inpatient Hospital Stay
Admission: EM | Admit: 2015-04-05 | Discharge: 2015-04-07 | DRG: 069 | Disposition: A | Discharge status: Home / Self Care | Payer: Medicare Other | Attending: Internal Medicine | Admitting: Internal Medicine

## 2015-04-05 ENCOUNTER — Observation Stay
Admit: 2015-04-05 | Discharge: 2015-04-05 | Disposition: A | Discharge status: Home / Self Care | Payer: Medicare Other | Attending: Neurology | Admitting: Neurology

## 2015-04-05 ENCOUNTER — Ambulatory Visit: Payer: Medicare Other

## 2015-04-05 ENCOUNTER — Encounter: Payer: Self-pay | Admitting: Emergency Medicine

## 2015-04-05 ENCOUNTER — Observation Stay: Payer: Medicare Other

## 2015-04-05 ENCOUNTER — Emergency Department: Payer: Medicare Other

## 2015-04-05 DIAGNOSIS — E162 Hypoglycemia, unspecified: Secondary | ICD-10-CM | POA: Diagnosis present

## 2015-04-05 DIAGNOSIS — Z9889 Other specified postprocedural states: Secondary | ICD-10-CM

## 2015-04-05 DIAGNOSIS — M4722 Other spondylosis with radiculopathy, cervical region: Secondary | ICD-10-CM | POA: Diagnosis present

## 2015-04-05 DIAGNOSIS — Z87442 Personal history of urinary calculi: Secondary | ICD-10-CM

## 2015-04-05 DIAGNOSIS — Z85828 Personal history of other malignant neoplasm of skin: Secondary | ICD-10-CM

## 2015-04-05 DIAGNOSIS — G459 Transient cerebral ischemic attack, unspecified: Secondary | ICD-10-CM | POA: Diagnosis not present

## 2015-04-05 DIAGNOSIS — G250 Essential tremor: Secondary | ICD-10-CM | POA: Diagnosis present

## 2015-04-05 DIAGNOSIS — R1310 Dysphagia, unspecified: Secondary | ICD-10-CM

## 2015-04-05 DIAGNOSIS — Z87891 Personal history of nicotine dependence: Secondary | ICD-10-CM

## 2015-04-05 DIAGNOSIS — R262 Difficulty in walking, not elsewhere classified: Secondary | ICD-10-CM | POA: Diagnosis present

## 2015-04-05 DIAGNOSIS — K219 Gastro-esophageal reflux disease without esophagitis: Secondary | ICD-10-CM | POA: Diagnosis present

## 2015-04-05 DIAGNOSIS — I639 Cerebral infarction, unspecified: Secondary | ICD-10-CM | POA: Diagnosis present

## 2015-04-05 DIAGNOSIS — G458 Other transient cerebral ischemic attacks and related syndromes: Secondary | ICD-10-CM

## 2015-04-05 DIAGNOSIS — Z794 Long term (current) use of insulin: Secondary | ICD-10-CM

## 2015-04-05 DIAGNOSIS — E1165 Type 2 diabetes mellitus with hyperglycemia: Secondary | ICD-10-CM | POA: Diagnosis present

## 2015-04-05 DIAGNOSIS — I10 Essential (primary) hypertension: Secondary | ICD-10-CM | POA: Diagnosis present

## 2015-04-05 DIAGNOSIS — Z833 Family history of diabetes mellitus: Secondary | ICD-10-CM

## 2015-04-05 DIAGNOSIS — E11649 Type 2 diabetes mellitus with hypoglycemia without coma: Secondary | ICD-10-CM | POA: Diagnosis present

## 2015-04-05 DIAGNOSIS — E785 Hyperlipidemia, unspecified: Secondary | ICD-10-CM | POA: Diagnosis present

## 2015-04-05 DIAGNOSIS — Z823 Family history of stroke: Secondary | ICD-10-CM

## 2015-04-05 DIAGNOSIS — Z7982 Long term (current) use of aspirin: Secondary | ICD-10-CM

## 2015-04-05 DIAGNOSIS — Z9049 Acquired absence of other specified parts of digestive tract: Secondary | ICD-10-CM

## 2015-04-05 DIAGNOSIS — Z8249 Family history of ischemic heart disease and other diseases of the circulatory system: Secondary | ICD-10-CM

## 2015-04-05 DIAGNOSIS — Z79899 Other long term (current) drug therapy: Secondary | ICD-10-CM

## 2015-04-05 DIAGNOSIS — R4781 Slurred speech: Secondary | ICD-10-CM

## 2015-04-05 DIAGNOSIS — Z9641 Presence of insulin pump (external) (internal): Secondary | ICD-10-CM | POA: Diagnosis present

## 2015-04-05 LAB — COMPREHENSIVE METABOLIC PANEL
ALT: 30 U/L (ref 17–63)
AST: 29 U/L (ref 15–41)
Albumin: 3.9 g/dL (ref 3.5–5.0)
Alkaline Phosphatase: 60 U/L (ref 38–126)
Anion gap: 8 (ref 5–15)
BILIRUBIN TOTAL: 0.8 mg/dL (ref 0.3–1.2)
BUN: 15 mg/dL (ref 6–20)
CHLORIDE: 100 mmol/L — AB (ref 101–111)
CO2: 31 mmol/L (ref 22–32)
CREATININE: 0.98 mg/dL (ref 0.61–1.24)
Calcium: 9.1 mg/dL (ref 8.9–10.3)
GLUCOSE: 62 mg/dL — AB (ref 65–99)
Potassium: 3.5 mmol/L (ref 3.5–5.1)
Sodium: 139 mmol/L (ref 135–145)
Total Protein: 7.1 g/dL (ref 6.5–8.1)

## 2015-04-05 LAB — PROTIME-INR
INR: 0.89
Prothrombin Time: 12.3 seconds (ref 11.4–15.0)

## 2015-04-05 LAB — URINALYSIS COMPLETE WITH MICROSCOPIC (ARMC ONLY)
BACTERIA UA: NONE SEEN
BILIRUBIN URINE: NEGATIVE
Glucose, UA: NEGATIVE mg/dL
Ketones, ur: NEGATIVE mg/dL
LEUKOCYTES UA: NEGATIVE
NITRITE: NEGATIVE
PH: 8 (ref 5.0–8.0)
SQUAMOUS EPITHELIAL / LPF: NONE SEEN
Specific Gravity, Urine: 1.007 (ref 1.005–1.030)

## 2015-04-05 LAB — DIFFERENTIAL
BASOS PCT: 0 %
Basophils Absolute: 0 10*3/uL (ref 0–0.1)
EOS ABS: 0.2 10*3/uL (ref 0–0.7)
Eosinophils Relative: 2 %
Lymphocytes Relative: 29 %
Lymphs Abs: 3.2 10*3/uL (ref 1.0–3.6)
MONO ABS: 0.9 10*3/uL (ref 0.2–1.0)
MONOS PCT: 8 %
NEUTROS ABS: 6.6 10*3/uL — AB (ref 1.4–6.5)
Neutrophils Relative %: 61 %

## 2015-04-05 LAB — APTT: aPTT: 26 seconds (ref 24–36)

## 2015-04-05 LAB — CBC
HEMATOCRIT: 45.8 % (ref 40.0–52.0)
Hemoglobin: 15.5 g/dL (ref 13.0–18.0)
MCH: 31.4 pg (ref 26.0–34.0)
MCHC: 33.8 g/dL (ref 32.0–36.0)
MCV: 92.9 fL (ref 80.0–100.0)
Platelets: 161 10*3/uL (ref 150–440)
RBC: 4.93 MIL/uL (ref 4.40–5.90)
RDW: 15.6 % — AB (ref 11.5–14.5)
WBC: 10.8 10*3/uL — ABNORMAL HIGH (ref 3.8–10.6)

## 2015-04-05 LAB — GLUCOSE, CAPILLARY
GLUCOSE-CAPILLARY: 160 mg/dL — AB (ref 65–99)
Glucose-Capillary: 67 mg/dL (ref 65–99)
Glucose-Capillary: 165 mg/dL — ABNORMAL HIGH (ref 65–99)
Glucose-Capillary: 165 mg/dL — ABNORMAL HIGH (ref 65–99)
Glucose-Capillary: 315 mg/dL — ABNORMAL HIGH (ref 65–99)
Glucose-Capillary: 323 mg/dL — ABNORMAL HIGH (ref 65–99)
Glucose-Capillary: 191 mg/dL — ABNORMAL HIGH (ref 65–99)

## 2015-04-05 LAB — TROPONIN I: TROPONIN I: 0.03 ng/mL (ref <0.031)

## 2015-04-05 MED ORDER — ACETAMINOPHEN 325 MG PO TABS: 650.0000 mg | ORAL_TABLET | Freq: Four times a day (QID) | ORAL | Status: DC | PRN

## 2015-04-05 MED ORDER — HYDROCODONE-ACETAMINOPHEN 5-325 MG PO TABS
1.0000 | ORAL_TABLET | ORAL | Status: DC | PRN
  Administered 2015-04-05: 1 via ORAL
  Administered 2015-04-05: 23:00:00 2 via ORAL
  Administered 2015-04-05: 18:00:00 1 via ORAL
  Administered 2015-04-06 (×2): 2 via ORAL
  Filled 2015-04-05: qty 2
  Filled 2015-04-05 (×2): qty 1
  Filled 2015-04-05 (×2): qty 2

## 2015-04-05 MED ORDER — DEXTROSE 50 % IV SOLN
12.5000 g | Freq: Once | INTRAVENOUS | Status: AC
  Administered 2015-04-05: 12.5 g via INTRAVENOUS
  Filled 2015-04-05: qty 50

## 2015-04-05 MED ORDER — ACETAMINOPHEN 650 MG RE SUPP: 650.0000 mg | Freq: Four times a day (QID) | RECTAL | Status: DC | PRN

## 2015-04-05 MED ORDER — PANTOPRAZOLE SODIUM 40 MG PO TBEC
40.0000 mg | DELAYED_RELEASE_TABLET | Freq: Every day | ORAL | Status: DC
  Administered 2015-04-05 – 2015-04-07 (×3): 40 mg via ORAL
  Filled 2015-04-05 (×3): qty 1

## 2015-04-05 MED ORDER — ATORVASTATIN CALCIUM 10 MG PO TABS
5.0000 mg | ORAL_TABLET | Freq: Every day | ORAL | Status: DC
  Administered 2015-04-05 – 2015-04-07 (×3): 5 mg via ORAL
  Filled 2015-04-05 (×3): qty 1

## 2015-04-05 MED ORDER — NITROGLYCERIN 0.4 MG SL SUBL: 0.4000 mg | SUBLINGUAL_TABLET | SUBLINGUAL | Status: DC | PRN

## 2015-04-05 MED ORDER — IOHEXOL 350 MG/ML SOLN
80.0000 mL | Freq: Once | INTRAVENOUS | Status: AC | PRN
  Administered 2015-04-05: 80 mL via INTRAVENOUS

## 2015-04-05 MED ORDER — PRIMIDONE 50 MG PO TABS
50.0000 mg | ORAL_TABLET | Freq: Two times a day (BID) | ORAL | Status: DC
  Administered 2015-04-05 – 2015-04-07 (×5): 50 mg via ORAL
  Filled 2015-04-05 (×5): qty 1

## 2015-04-05 MED ORDER — GABAPENTIN 100 MG PO CAPS
100.0000 mg | ORAL_CAPSULE | Freq: Every day | ORAL | Status: DC
  Administered 2015-04-05 – 2015-04-06 (×2): 100 mg via ORAL
  Filled 2015-04-05 (×2): qty 1

## 2015-04-05 MED ORDER — ASPIRIN 300 MG RE SUPP
300.0000 mg | Freq: Once | RECTAL | Status: AC
  Administered 2015-04-05: 300 mg via RECTAL
  Filled 2015-04-05: qty 1

## 2015-04-05 MED ORDER — DOCUSATE SODIUM 100 MG PO CAPS
100.0000 mg | ORAL_CAPSULE | Freq: Two times a day (BID) | ORAL | Status: DC | PRN
  Administered 2015-04-06: 04:00:00 100 mg via ORAL
  Filled 2015-04-05: qty 1

## 2015-04-05 MED ORDER — LORAZEPAM 2 MG/ML IJ SOLN: 1.0000 mg | Freq: Four times a day (QID) | INTRAMUSCULAR | Status: DC | PRN

## 2015-04-05 MED ORDER — ESCITALOPRAM OXALATE 10 MG PO TABS
20.0000 mg | ORAL_TABLET | Freq: Every day | ORAL | Status: DC
  Administered 2015-04-05 – 2015-04-07 (×3): 20 mg via ORAL
  Filled 2015-04-05 (×3): qty 2

## 2015-04-05 MED ORDER — CLOPIDOGREL BISULFATE 75 MG PO TABS
75.0000 mg | ORAL_TABLET | Freq: Every day | ORAL | Status: DC
  Administered 2015-04-05 – 2015-04-07 (×3): 75 mg via ORAL
  Filled 2015-04-05 (×3): qty 1

## 2015-04-05 MED ORDER — THIAMINE HCL 100 MG/ML IJ SOLN: 100.0000 mg | Freq: Every day | INTRAMUSCULAR | Status: DC

## 2015-04-05 MED ORDER — SENNOSIDES-DOCUSATE SODIUM 8.6-50 MG PO TABS
1.0000 | ORAL_TABLET | Freq: Every evening | ORAL | Status: DC | PRN
Start: 2015-04-05 — End: 2015-04-07
  Administered 2015-04-06: 21:00:00 1 via ORAL
  Filled 2015-04-05: qty 1

## 2015-04-05 MED ORDER — ENOXAPARIN SODIUM 40 MG/0.4ML ~~LOC~~ SOLN
40.0000 mg | SUBCUTANEOUS | Status: DC
  Administered 2015-04-05 – 2015-04-07 (×3): 40 mg via SUBCUTANEOUS
  Filled 2015-04-05 (×3): qty 0.4

## 2015-04-05 MED ORDER — INSULIN ASPART 100 UNIT/ML ~~LOC~~ SOLN
0.0000 [IU] | Freq: Three times a day (TID) | SUBCUTANEOUS | Status: DC
  Administered 2015-04-05: 7 [IU] via SUBCUTANEOUS
  Administered 2015-04-05: 12:00:00 2 [IU] via SUBCUTANEOUS
  Administered 2015-04-06: 09:00:00 3 [IU] via SUBCUTANEOUS
  Administered 2015-04-06: 9 [IU] via SUBCUTANEOUS
  Filled 2015-04-05: qty 2
  Filled 2015-04-05: qty 3
  Filled 2015-04-05: qty 9
  Filled 2015-04-05: qty 7

## 2015-04-05 MED ORDER — ASPIRIN 81 MG PO CHEW
81.0000 mg | CHEWABLE_TABLET | Freq: Every day | ORAL | Status: DC
  Administered 2015-04-05 – 2015-04-07 (×3): 81 mg via ORAL
  Filled 2015-04-05 (×3): qty 1

## 2015-04-05 MED ORDER — ADULT MULTIVITAMIN W/MINERALS CH
1.0000 | ORAL_TABLET | Freq: Every day | ORAL | Status: DC
  Administered 2015-04-05 – 2015-04-07 (×2): 1 via ORAL
  Filled 2015-04-05 (×3): qty 1

## 2015-04-05 MED ORDER — LORAZEPAM 2 MG PO TABS: 0.0000 mg | ORAL_TABLET | Freq: Four times a day (QID) | ORAL | Status: DC

## 2015-04-05 MED ORDER — ONDANSETRON HCL 4 MG/2ML IJ SOLN: 4.0000 mg | Freq: Four times a day (QID) | INTRAMUSCULAR | Status: DC | PRN

## 2015-04-05 MED ORDER — LORATADINE 10 MG PO TABS
10.0000 mg | ORAL_TABLET | Freq: Every day | ORAL | Status: DC
  Administered 2015-04-05 – 2015-04-07 (×3): 10 mg via ORAL
  Filled 2015-04-05 (×3): qty 1

## 2015-04-05 MED ORDER — SODIUM CHLORIDE 0.9 % IV BOLUS (SEPSIS)
500.0000 mL | Freq: Once | INTRAVENOUS | Status: AC
  Administered 2015-04-05: 500 mL via INTRAVENOUS

## 2015-04-05 MED ORDER — FOLIC ACID 1 MG PO TABS
1.0000 mg | ORAL_TABLET | Freq: Every day | ORAL | Status: DC
  Administered 2015-04-05 – 2015-04-07 (×3): 1 mg via ORAL
  Filled 2015-04-05 (×3): qty 1

## 2015-04-05 MED ORDER — SODIUM CHLORIDE 0.9 % IV BOLUS (SEPSIS): 500.0000 mL | Freq: Once | INTRAVENOUS | Status: DC

## 2015-04-05 MED ORDER — LORAZEPAM 2 MG PO TABS: 0.0000 mg | ORAL_TABLET | Freq: Two times a day (BID) | ORAL | Status: DC

## 2015-04-05 MED ORDER — ISOSORBIDE MONONITRATE ER 60 MG PO TB24
60.0000 mg | ORAL_TABLET | Freq: Every day | ORAL | Status: DC
  Administered 2015-04-05 – 2015-04-07 (×3): 60 mg via ORAL
  Filled 2015-04-05 (×3): qty 1

## 2015-04-05 MED ORDER — AMLODIPINE BESYLATE 5 MG PO TABS
5.0000 mg | ORAL_TABLET | Freq: Every day | ORAL | Status: DC
  Administered 2015-04-05 – 2015-04-06 (×2): 5 mg via ORAL
  Filled 2015-04-05 (×2): qty 1

## 2015-04-05 MED ORDER — VITAMIN B-1 100 MG PO TABS
100.0000 mg | ORAL_TABLET | Freq: Every day | ORAL | Status: DC
  Administered 2015-04-05 – 2015-04-07 (×3): 100 mg via ORAL
  Filled 2015-04-05 (×3): qty 1

## 2015-04-05 MED ORDER — ONDANSETRON HCL 4 MG PO TABS: 4.0000 mg | ORAL_TABLET | Freq: Four times a day (QID) | ORAL | Status: DC | PRN

## 2015-04-05 MED ORDER — LORAZEPAM 1 MG PO TABS: 1.0000 mg | ORAL_TABLET | Freq: Four times a day (QID) | ORAL | Status: DC | PRN

## 2015-04-05 MED ORDER — HYOSCYAMINE SULFATE ER 0.375 MG PO TB12
0.3750 mg | ORAL_TABLET | Freq: Two times a day (BID) | ORAL | Status: DC
  Administered 2015-04-05 – 2015-04-07 (×5): 0.375 mg via ORAL
  Filled 2015-04-05 (×5): qty 1

## 2015-04-05 MED ORDER — PROPRANOLOL HCL 20 MG PO TABS
20.0000 mg | ORAL_TABLET | Freq: Two times a day (BID) | ORAL | Status: DC
  Administered 2015-04-05 – 2015-04-07 (×5): 20 mg via ORAL
  Filled 2015-04-05 (×6): qty 1

## 2015-04-05 MED ORDER — FUROSEMIDE 40 MG PO TABS
40.0000 mg | ORAL_TABLET | Freq: Every day | ORAL | Status: DC
  Administered 2015-04-05 – 2015-04-07 (×3): 40 mg via ORAL
  Filled 2015-04-05 (×3): qty 1

## 2015-04-05 NOTE — ED Notes (Signed)
Pt states went to bed at 2100 yesterday. Pt's spouse reports to ems pt awoke at 0330 with slurred speech, difficulty ambulating and "not acting like himself". Pt with clear speech at this time, alert and oriented x4, pt moving all extremities equally, sensation intact in all extremities, no facial droop or ptosis noted. Pt denies pain.fsbs with ems 86.

## 2015-04-05 NOTE — H&P (Signed)
[place] [facility] [place] NAME: Richard Chapman    MR#:  [id number]  DATE OF BIRTH:  Oct 21, 1940  DATE OF ADMISSION:  04/05/2015  PRIMARY CARE PHYSICIAN: Adrian Prows, MD   REQUESTING/REFERRING PHYSICIAN: Dr. Dahlia Client  CHIEF COMPLAINT:  Slurred speech and confusion not his usual self  HISTORY OF PRESENT ILLNESS:  Richard Chapman  is a 75 y.o. Chapman with a known history of type 2 diabetes on insulin pump, hypertension, essential tremor, GERD, hyperlipidemia comes to the emergency room accompanied by patient's wife. According to the wife patient was found on the recliner around 3 AM. He appeared confused had some slurred speech and did not know his whereabouts. He was surprised to find the EMTs in his house early hours of the morning. His sugars was 62 when checked by EMS. Was brought to the emergency room at present is hemodynamically stable alert oriented 3. Denies any focal weakness or any slurred speech or any blurred vision. CT of the head shows old lacunar infarcts. No new infarct. Wife reports patient having difficulty swallowing on and off with coughing intermittently with meals. He has had history of esophageal dilatation in the remote past. Patient denies any chest pain or shortness of breath. He is being admitted with hypoglycemia likely causing symptoms versus TIA  PAST MEDICAL HISTORY:   Past Medical History  Diagnosis Date  . History of nephrolithiasis   . GERD (gastroesophageal reflux disease)   . Anxiety and depression   . Skin cancer   . Depression   . Diabetes ([place])   . Hypertension   . Sleep apnea   . Elevated PSA     PAST SURGICAL HISTOIRY:   Past Surgical History  Procedure Laterality Date  . Tonsillectomy    . Knee surgery Bilateral   . Shoulder surgery    . Back surgery    . Neck surgery    . Appendectomy    . Wrist surgery      SOCIAL HISTORY:   Social History  Substance Use Topics  . Smoking status:  Former Smoker    Quit date: 05/21/2014  . Smokeless tobacco: Not on file  . Alcohol Use: 1.2 oz/week    2 Standard drinks or equivalent per week    FAMILY HISTORY:   Family History  Problem Relation Age of Onset  . Heart disease Mother   . Heart disease Father   . Diabetes Father   . Stroke Father   . Kidney disease Neg Hx   . Prostate cancer Neg Hx     DRUG ALLERGIES:  No Known Allergies  REVIEW OF SYSTEMS:  Review of Systems  Constitutional: Negative for fever, chills and weight loss.  HENT: Negative for ear discharge, ear pain and nosebleeds.   Eyes: Negative for blurred vision, pain and discharge.  Respiratory: Negative for sputum production, shortness of breath, wheezing and stridor.   Cardiovascular: Negative for chest pain, palpitations, orthopnea and PND.  Gastrointestinal: Negative for nausea, vomiting, abdominal pain and diarrhea.       Difficulty swallowing  Genitourinary: Negative for urgency and frequency.  Musculoskeletal: Positive for neck pain. Negative for back pain and joint pain.  Neurological: Negative for sensory change, speech change, focal weakness and weakness.  Psychiatric/Behavioral: Negative for depression and hallucinations. The patient is not nervous/anxious.      MEDICATIONS AT HOME:   Prior to Admission medications   Medication Sig Start Date End Date Taking? Authorizing Provider  acetaminophen (  TYLENOL) 325 MG tablet Take 2 tablets (650 mg total) by mouth every 6 (six) hours as needed for mild pain (or Fever >/= 101). 11/26/14  Yes Nicholes Mango, MD  amLODipine (NORVASC) 5 MG tablet Take 1 tablet (5 mg total) by mouth daily. 11/26/14  Yes Nicholes Mango, MD  aspirin 81 MG tablet Take 1 tablet (81 mg total) by mouth daily. 11/26/14  Yes Nicholes Mango, MD  atorvastatin (LIPITOR) 10 MG tablet Take 5 mg by mouth daily.   Yes Historical Provider, MD  docusate sodium (COLACE) 100 MG capsule Take 1 capsule (100 mg total) by mouth 2 (two) times daily as  needed for mild constipation. 11/26/14  Yes Nicholes Mango, MD  escitalopram (LEXAPRO) 20 MG tablet Take 1 tablet (20 mg total) by mouth daily. 11/26/14  Yes Nicholes Mango, MD  esomeprazole (NEXIUM) 20 MG capsule Take 1 capsule (20 mg total) by mouth AC breakfast. 11/26/14  Yes Nicholes Mango, MD  furosemide (LASIX) 40 MG tablet Take 1 tablet (40 mg total) by mouth daily. 11/26/14  Yes Nicholes Mango, MD  gabapentin (NEURONTIN) 100 MG capsule Take 100 mg by mouth at bedtime.    Yes Historical Provider, MD  hyoscyamine (LEVBID) 0.375 MG 12 hr tablet Take 0.375 mg by mouth every 12 (twelve) hours.   Yes Historical Provider, MD  Insulin Disposable Pump (V-GO 30) KIT  07/27/14  Yes Historical Provider, MD  insulin lispro (HUMALOG) 100 UNIT/ML injection Inject 76 Units into the skin daily. Via VGo kit   Yes Historical Provider, MD  isosorbide mononitrate (IMDUR) 60 MG 24 hr tablet Take 1 tablet (60 mg total) by mouth 2 (two) times daily. Patient taking differently: Take 60 mg by mouth daily.  11/26/14  Yes Nicholes Mango, MD  loratadine (CLARITIN) 10 MG tablet Take 10 mg by mouth daily.   Yes Historical Provider, MD  metFORMIN (GLUCOPHAGE) 1000 MG tablet Take 1 tablet (1,000 mg total) by mouth daily with breakfast. Patient taking differently: Take 1,000 mg by mouth 2 (two) times daily with a meal.  11/28/14  Yes Nicholes Mango, MD  nitroGLYCERIN (NITROSTAT) 0.4 MG SL tablet Place 0.4 mg under the tongue every 5 (five) minutes as needed for chest pain.    Yes Historical Provider, MD  primidone (MYSOLINE) 50 MG tablet Take 1 tablet by mouth 2 (two) times daily. 11/04/14  Yes Historical Provider, MD  propranolol (INDERAL) 20 MG tablet Take 20 mg by mouth 2 (two) times daily.    Yes Historical Provider, MD  HYDROcodone-acetaminophen (NORCO/VICODIN) 5-325 MG tablet Take 1 tablet by mouth every 6 (six) hours as needed for moderate pain.    Historical Provider, MD      VITAL SIGNS:  Blood pressure 173/74, pulse 58,  temperature 97.4 F (36.3 C), temperature source Oral, resp. rate 17, height _0  (1.753 m), weight 79.918 kg (176 lb 3 oz), SpO2 98 %.  PHYSICAL EXAMINATION:  GENERAL:  75 y.o.-year-old patient lying in the bed with no acute distress.  EYES: Pupils equal, round, reactive to light and accommodation. No scleral icterus. Extraocular muscles intact.  HEENT: Head atraumatic, normocephalic. Oropharynx and nasopharynx clear.  NECK:  Supple, no jugular venous distention. No thyroid enlargement, no tenderness.  LUNGS: Normal breath sounds bilaterally, no wheezing, rales,rhonchi or crepitation. No use of accessory muscles of respiration.  CARDIOVASCULAR: S1, S2 normal. No murmurs, rubs, or gallops.  ABDOMEN: Soft, nontender, nondistended. Bowel sounds present. No organomegaly or mass.  EXTREMITIES: No pedal edema, cyanosis, or clubbing.  NEUROLOGIC: Cranial nerves II through XII are intact. Muscle strength 5/5 in all extremities. Sensation intact. Gait not checked. No slurred speech PSYCHIATRIC: The patient is alert and oriented x 3.  SKIN: No obvious rash, lesion, or ulcer.   LABORATORY PANEL:   CBC  Recent Labs Lab 04/05/15 0506  WBC 10.8*  HGB 15.5  HCT 45.8  PLT 161   ------------------------------------------------------------------------------------------------------------------  Chemistries   Recent Labs Lab 04/05/15 0506  NA 139  K 3.5  CL 100*  CO2 31  GLUCOSE 62*  BUN 15  CREATININE 0.98  CALCIUM 9.1  AST 29  ALT 30  ALKPHOS 60  BILITOT 0.8   ------------------------------------------------------------------------------------------------------------------  Cardiac Enzymes  Recent Labs Lab 04/05/15 0506  TROPONINI 0.03   ------------------------------------------------------------------------------------------------------------------  RADIOLOGY:  Ct Head Wo Contrast  04/05/2015  CLINICAL DATA:  Acute onset of slurred speech and difficulty ambulating.  Initial encounter. EXAM: CT HEAD WITHOUT CONTRAST TECHNIQUE: Contiguous axial images were obtained from the base of the skull through the vertex without intravenous contrast. COMPARISON:  CT of the head performed 07/23/2012, and MRI of the brain performed 12/09/2014 FINDINGS: There is no evidence of acute infarction, mass lesion, or intra- or extra-axial hemorrhage on CT. Prominence of the ventricles and sulci reflects mild cortical volume loss. Cerebellar atrophy is noted. Scattered periventricular and subcortical white matter change likely reflects small vessel ischemic microangiopathy. 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 Chronic lacunar infarcts are also noted at the pons. The fourth ventricle is within normal limits. The cerebral hemispheres demonstrate grossly normal gray-white differentiation. No mass effect or midline shift is seen. There is no evidence of fracture; bilateral bore holes are noted near the vertex. The visualized portions of the orbits are within normal limits. The paranasal sinuses and mastoid air cells are well-aerated. No significant soft tissue abnormalities are seen. IMPRESSION: 1. No acute intracranial pathology seen on CT. 2. Mild cortical volume loss and scattered small vessel ischemic microangiopathy. 3. 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 Chronic lacunar infarcts at the pons. Electronically Signed   By: Garald Balding M.D.   On: 04/05/2015 05:38    EKG:    Sinus rhythm IMPRESSION AND PLAN:  Richard Chapman  is a 75 y.o. Chapman with a known history of type 2 diabetes on insulin pump, hypertension, essential tremor, GERD, hyperlipidemia comes to the emergency room accompanied by patient's wife. According to the wife patient was found on the recliner around 3 AM. He appeared confused had some slurred speech and did not know his whereabouts. He was surprised to find the EMTs in his house early  hours of the morning. His sugars was 62 when checked by EMS.   1. Altered mental status/transient confusion/slurred speech suspected due to hypoglycemia -There is a possibility of TIA as well -Admit to medical floor -Turn off insulin pump, continue sliding scale insulin, resume insulin pump if sugar trends up -Neuro checks every 2 hourly -By mouth aspirin daily -Since patient appears neurologically intact I will hold off on further stroke workup at present  2. Difficulty swallowing with history of esophageal dilatation in the past Speech therapy to see patient -Barium swallow to eval swallowing  3. Hypertension continue home meds  4. Type 2 diabetes on insulin pump -We'll hold off on insulin pump continue sliding scale insulin and resume pump once sugars trend up  5. Hyperlipidemia Continue statins  6. 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 Generalized weakness difficulty ambulating Physical therapy to see patient  8. DVT prophylaxis subcutaneous Lovenox   All the records are reviewed and case discussed with ED provider. Management plans discussed with the patient, family and they are in agreement.  CODE STATUS: Full  TOTAL TIME TAKING CARE OF THIS PATIENT:  50 minutes.    Richard Chapman M.D on 04/05/2015 at 9:06 AM  Between 7am to 6pm - Pager - [phone]  After 6pm go to [email or web address] - password [email or web address] [facility]  [facility]  Office  [phone]  CC: Primary care physician; Adrian Prows, MD

## 2015-04-05 NOTE — ED Provider Notes (Signed)
[facility] Emergency Department Provider Note  ____________________________________________  Time seen: Approximately 530 AM  I have reviewed the triage vital signs and the nursing notes.   HISTORY  Chief Complaint Altered Mental Status; Difficulty Walking; and Aphasia    HPI Kasin Gene Nazaire is a 75 y.o. male who comes into the hospital today with some slurred speech and disorientation at home. The patient reports that he is unsure why he is here his wife and his son wanted him here. The patient's wife reports that he had been sleeping on the couch because she had been snoring. She heard the dog barking reports that when she went out there the patient was unarousable. She reports that when she did attempt to wake him up he seemed to be slurring his speech and not acting himself. She was unsure if his blood sugar was low so they called the rescue squad but the patient was improving prior to EMS arrival. Per EMS the patient's blood sugar was in the 80s when he arrived here was in the 60s. The patient has not been having any vomiting or diarrhea but reports he has some vague abdominal pain. The patient knows who he has some areas and has no complaints at this time.   Past Medical History  Diagnosis Date  . History of nephrolithiasis   . GERD (gastroesophageal reflux disease)   . Anxiety and depression   . Skin cancer   . Depression   . Diabetes ([place])   . Hypertension   . Sleep apnea   . Elevated PSA     Patient Active Problem List   Diagnosis Date Noted  . Malignant neoplasm of prostate ([place]) 01/29/2015  . Cervical disc disease 12/03/2014  . Chest pain 11/25/2014  . Arthritis 08/20/2014  . Benign fibroma of prostate 08/20/2014  . Clinical depression 08/20/2014  . Failure of erection 08/20/2014  . Enlarged prostate 08/20/2014  . Major depressive disorder with single episode ([place]) 08/20/2014  . 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  .  Benign essential HTN 08/04/2014  . Acute low back pain 04/23/2014  . Alteration in bowel elimination: incontinence 04/23/2014  . Urge incontinence 04/23/2014  . 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 Benign essential tremor 12/23/2013  . B12 deficiency 08/13/2013  . Chronic kidney disease (CKD), stage III (moderate) 08/13/2013  . Diabetes ([place]) 08/13/2013  . Hypoglycemia 08/13/2013  . Diabetes mellitus ([place]) 08/13/2013  . Type 2 diabetes mellitus ([place]) 08/13/2013  . Arteriosclerosis of coronary artery 07/24/2013  . Fatigue 07/24/2013  . Acid reflux 07/24/2013  . Combined fat and carbohydrate induced hyperlipemia 07/24/2013  . CAD in native artery 07/24/2013  . Gastro-esophageal reflux disease without esophagitis 07/24/2013  . 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 Subdural hematoma ([place]) 03/26/2013  . Traumatic subdural hemorrhage ([place]) 03/26/2013  . Abnormal prostate specific antigen 09/01/2012  . Benign prostatic hyperplasia with urinary obstruction 09/01/2012  . Elevated prostate specific antigen (PSA) 09/01/2012    Past Surgical History  Procedure Laterality Date  . Tonsillectomy    . Knee surgery Bilateral   . Shoulder surgery    . Back surgery    . Neck surgery    . Appendectomy    . Wrist surgery      Current Outpatient Rx  Name  Route  Sig  Dispense  Refill  . acetaminophen (TYLENOL) 325 MG tablet   Oral   Take 2 tablets (650 mg total) by mouth every 6 (six) hours as needed  for mild pain (or Fever >/= 101).         Marland Kitchen amLODipine (NORVASC) 5 MG tablet   Oral   Take 1 tablet (5 mg total) by mouth daily.   30 tablet   0   . aspirin 81 MG tablet   Oral   Take 1 tablet (81 mg total) by mouth daily.   30 tablet   0   . atorvastatin (LIPITOR) 10 MG tablet   Oral   Take 5 mg by mouth daily.         Marland Kitchen docusate sodium (COLACE) 100 MG capsule   Oral   Take 1 capsule (100 mg total) by mouth 2 (two) times daily as needed for mild constipation.    10 capsule   0   . escitalopram (LEXAPRO) 20 MG tablet   Oral   Take 1 tablet (20 mg total) by mouth daily.   30 tablet   0   . esomeprazole (NEXIUM) 20 MG capsule   Oral   Take 1 capsule (20 mg total) by mouth AC breakfast.   30 capsule   0   . furosemide (LASIX) 40 MG tablet   Oral   Take 1 tablet (40 mg total) by mouth daily.   30 tablet   0   . gabapentin (NEURONTIN) 100 MG capsule   Oral   Take by mouth.         Marland Kitchen HYDROcodone-acetaminophen (NORCO/VICODIN) 5-325 MG tablet   Oral   Take 1 tablet by mouth every 6 (six) hours as needed for moderate pain.         . Insulin Disposable Pump (V-GO 30) KIT               . insulin lispro (HUMALOG) 100 UNIT/ML injection   Subcutaneous   Inject into the skin.         Marland Kitchen isosorbide mononitrate (IMDUR) 60 MG 24 hr tablet   Oral   Take 1 tablet (60 mg total) by mouth 2 (two) times daily.   60 tablet   0   . magnesium 30 MG tablet   Oral   Take 30 mg by mouth 2 (two) times daily.         . metFORMIN (GLUCOPHAGE) 1000 MG tablet   Oral   Take 1 tablet (1,000 mg total) by mouth daily with breakfast.   30 tablet   0   . nitroGLYCERIN (NITROSTAT) 0.4 MG SL tablet   Sublingual   Place 0.4 mg under the tongue every 5 (five) minutes as needed for chest pain.          . primidone (MYSOLINE) 50 MG tablet   Oral   Take 1 tablet by mouth 2 (two) times daily.         . propranolol (INDERAL) 20 MG tablet   Oral   Take 20 mg by mouth 2 (two) times daily.          . tamsulosin (FLOMAX) 0.4 MG CAPS capsule   Oral   Take by mouth.           Allergies Review of patient's allergies indicates no known allergies.  Family History  Problem Relation Age of Onset  . Heart disease Mother   . Heart disease Father   . Diabetes Father   . Stroke Father   . Kidney disease Neg Hx   . Prostate cancer Neg Hx     Social History Social History  Substance Use Topics  .  Smoking status: Former Smoker    Quit  date: 05/21/2014  . Smokeless tobacco: Not on file  . Alcohol Use: 1.2 oz/week    2 Standard drinks or equivalent per week    Review of Systems Constitutional: No fever/chills Eyes: No visual changes. ENT: No sore throat. Cardiovascular: Denies chest pain. Respiratory: Denies shortness of breath. Gastrointestinal: No abdominal pain.  No nausea, no vomiting.  No diarrhea.  No constipation. Genitourinary: Negative for dysuria. Musculoskeletal: Negative for back pain. Skin: Negative for rash. Neurological: Slurred speech and decreased responsiveness  10-point ROS otherwise negative.  ____________________________________________   PHYSICAL EXAM:  VITAL SIGNS: ED Triage Vitals  Enc Vitals Group     BP 04/05/15 0500 191/93 mmHg     Pulse Rate 04/05/15 0500 64     Resp 04/05/15 0502 16     Temp 04/05/15 0721 97.4 F (36.3 C)     Temp Source 04/05/15 0502 Axillary     SpO2 04/05/15 0500 98 %     Weight 04/05/15 0502 176 lb 3 oz (79.918 kg)     Height 04/05/15 0502 5' 9"  (1.753 m)     Head Cir --      Peak Flow --      Pain Score 04/05/15 0720 7     Pain Loc --      Pain Edu? --      Excl. in [place]? --     Constitutional: Alert and oriented. Well appearing and in no acute distress. Eyes: Conjunctivae are normal. PERRL. EOMI. Head: Atraumatic. Nose: No congestion/rhinnorhea. Mouth/Throat: Mucous membranes are moist.  Oropharynx non-erythematous. Cardiovascular: Normal rate, regular rhythm. Grossly normal heart sounds.  Good peripheral circulation. Respiratory: Normal respiratory effort.  No retractions. Lungs CTAB. Gastrointestinal: Soft and nontender. No distention. Positive bowel sounds Musculoskeletal: No lower extremity tenderness nor edema.   Neurologic:  Normal speech and language. Cranial nerves II through XII are grossly intact, no focal motor or neuro deficits. Skin:  Skin is warm, dry and intact. Psychiatric: Mood and affect are normal.    ____________________________________________   LABS (all labs ordered are listed, but only abnormal results are displayed)  Labs Reviewed  CBC - Abnormal; Notable for the following:    WBC 10.8 (*)    RDW 15.6 (*)    All other components within normal limits  DIFFERENTIAL - Abnormal; Notable for the following:    Neutro Abs 6.6 (*)    All other components within normal limits  COMPREHENSIVE METABOLIC PANEL - Abnormal; Notable for the following:    Chloride 100 (*)    Glucose, Bld 62 (*)    All other components within normal limits  URINALYSIS COMPLETEWITH MICROSCOPIC ([facility] ONLY) - Abnormal; Notable for the following:    Color, Urine STRAW (*)    APPearance CLEAR (*)    Hgb urine dipstick 1+ (*)    Protein, ur >500 (*)    All other components within normal limits  PROTIME-INR  APTT  TROPONIN I  GLUCOSE, CAPILLARY  CBG MONITORING, ED  CBG MONITORING, ED   ____________________________________________  EKG  ED ECG REPORT I, Loney Hering, the attending physician, personally viewed and interpreted this ECG.   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 Chronic lacunar infarcts at the pons. ____________________________________________   PROCEDURES  Procedure(s) performed: None  Critical Care performed: No  ____________________________________________   INITIAL IMPRESSION / ASSESSMENT AND PLAN / ED COURSE  Pertinent labs & imaging results that were available during my care of the patient were reviewed by me and considered in my medical decision making (see chart for details).  This is a 75 year old male who  comes into the hospital today with some slurred speech and difficulty waking up at home. It lasted approximately 15 minutes and then resolved. The patient had a blood sugar of 80s by EMS but in the 60s here. I did give the patient some half an amp of dextrose as he is aroused. We attempted to get the patient to eat but he failed a swallow study because she has trouble swallowing at times. The patient's blood work is unremarkable at this time and he is neurologically intact. I will attempt to admit the patient to the hospitalist service for stroke workup as well as hypoglycemia. At this time the patient has no further complaints or concerns.  The patient will receive some aspirin suppository and he'll be admitted to the hospitalist service. ____________________________________________   FINAL CLINICAL IMPRESSION(S) / ED DIAGNOSES  Final diagnoses:  Other specified transient cerebral ischemias  Slurred speech      Loney Hering, MD 04/05/15 [phone]

## 2015-04-05 NOTE — Consult Note (Addendum)
Referring Physician: Posey Pronto    Chief Complaint: Left sided weakness   HPI: Richard Chapman is an 75 y.o. male who presented early this morning with an episode of confusion and slurred speech.  CBG was low and patient was brought to the ED.  Symptoms cleared but patient was admitted.  When being seen by nursing on the unit the patient had the acute onset of confusion and inability to move the left side.  Code stroke was called at that time.  Initial NIHSS of 9.  Date last known well: 04/05/2015 Time last known well: Time: 13:48 tPA Given: No: Resolution of symptoms  Past Medical History  Diagnosis Date  . History of nephrolithiasis   . GERD (gastroesophageal reflux disease)   . Anxiety and depression   . Skin cancer   . Depression   . Diabetes ([place])   . Hypertension   . Sleep apnea   . Elevated PSA     Past Surgical History  Procedure Laterality Date  . Tonsillectomy    . Knee surgery Bilateral   . Shoulder surgery    . Back surgery    . Neck surgery    . Appendectomy    . Wrist surgery      Family History  Problem Relation Age of Onset  . Heart disease Mother   . Heart disease Father   . Diabetes Father   . Stroke Father   . Kidney disease Neg Hx   . Prostate cancer Neg Hx    Social History:  reports that he quit smoking about 10 months ago. He does not have any smokeless tobacco history on file. He reports that he drinks about 1.2 oz of alcohol per week. He reports that he does not use illicit drugs.  Allergies: No Known Allergies  Medications:  I have reviewed the patient's current medications. Prior to Admission:  Prescriptions prior to admission  Medication Sig Dispense Refill Last Dose  . acetaminophen (TYLENOL) 325 MG tablet Take 2 tablets (650 mg total) by mouth every 6 (six) hours as needed for mild pain (or Fever >/= 101).   prn at prn  . amLODipine (NORVASC) 5 MG tablet Take 1 tablet (5 mg total) by mouth daily. 30 tablet 0 04/04/2015 at Unknown time  .  aspirin 81 MG tablet Take 1 tablet (81 mg total) by mouth daily. 30 tablet 0 04/04/2015 at Unknown time  . atorvastatin (LIPITOR) 10 MG tablet Take 5 mg by mouth daily.   04/04/2015 at Unknown time  . docusate sodium (COLACE) 100 MG capsule Take 1 capsule (100 mg total) by mouth 2 (two) times daily as needed for mild constipation. 10 capsule 0 prn at prn  . escitalopram (LEXAPRO) 20 MG tablet Take 1 tablet (20 mg total) by mouth daily. 30 tablet 0 04/04/2015 at Unknown time  . esomeprazole (NEXIUM) 20 MG capsule Take 1 capsule (20 mg total) by mouth AC breakfast. 30 capsule 0 04/04/2015 at Unknown time  . furosemide (LASIX) 40 MG tablet Take 1 tablet (40 mg total) by mouth daily. 30 tablet 0 04/04/2015 at Unknown time  . gabapentin (NEURONTIN) 100 MG capsule Take 100 mg by mouth at bedtime.    04/04/2015 at Unknown time  . hyoscyamine (LEVBID) 0.375 MG 12 hr tablet Take 0.375 mg by mouth every 12 (twelve) hours.   04/04/2015 at Unknown time  . Insulin Disposable Pump (V-GO 30) KIT    04/05/2015 at Unknown time  . insulin lispro (HUMALOG) 100 UNIT/ML  injection Inject 76 Units into the skin daily. Via VGo kit   04/04/2015 at Unknown time  . isosorbide mononitrate (IMDUR) 60 MG 24 hr tablet Take 1 tablet (60 mg total) by mouth 2 (two) times daily. (Patient taking differently: Take 60 mg by mouth daily. ) 60 tablet 0 04/04/2015 at Unknown time  . loratadine (CLARITIN) 10 MG tablet Take 10 mg by mouth daily.   04/04/2015 at Unknown time  . metFORMIN (GLUCOPHAGE) 1000 MG tablet Take 1 tablet (1,000 mg total) by mouth daily with breakfast. (Patient taking differently: Take 1,000 mg by mouth 2 (two) times daily with a meal. ) 30 tablet 0 04/04/2015 at Unknown time  . nitroGLYCERIN (NITROSTAT) 0.4 MG SL tablet Place 0.4 mg under the tongue every 5 (five) minutes as needed for chest pain.    prn at prn  . primidone (MYSOLINE) 50 MG tablet Take 1 tablet by mouth 2 (two) times daily.   04/04/2015 at Unknown time  .  propranolol (INDERAL) 20 MG tablet Take 20 mg by mouth 2 (two) times daily.    04/04/2015 at Unknown time  . HYDROcodone-acetaminophen (NORCO/VICODIN) 5-325 MG tablet Take 1 tablet by mouth every 6 (six) hours as needed for moderate pain.   Taking   Scheduled: . amLODipine  5 mg Oral Daily  . aspirin  81 mg Oral Daily  . atorvastatin  5 mg Oral Daily  . enoxaparin (LOVENOX) injection  40 mg Subcutaneous Q24H  . escitalopram  20 mg Oral Daily  . furosemide  40 mg Oral Daily  . gabapentin  100 mg Oral QHS  . hyoscyamine  0.375 mg Oral Q12H  . insulin aspart  0-9 Units Subcutaneous TID WC  . isosorbide mononitrate  60 mg Oral Daily  . loratadine  10 mg Oral Daily  . pantoprazole  40 mg Oral Daily  . primidone  50 mg Oral BID  . propranolol  20 mg Oral BID  . sodium chloride  500 mL Intravenous Once    ROS: History obtained from the patient  General ROS: negative for - chills, fatigue, fever, night sweats, weight gain or weight loss Psychological ROS: as noted in HPI Ophthalmic ROS: negative for - blurry vision, double vision, eye pain or loss of vision ENT ROS: negative for - epistaxis, nasal discharge, oral lesions, sore throat, tinnitus or vertigo Allergy and Immunology ROS: negative for - hives or itchy/watery eyes Hematological and Lymphatic ROS: negative for - bleeding problems, bruising or swollen lymph nodes Endocrine ROS: negative for - galactorrhea, hair pattern changes, polydipsia/polyuria or temperature intolerance Respiratory ROS: negative for - cough, hemoptysis, shortness of breath or wheezing Cardiovascular ROS: negative for - chest pain, dyspnea on exertion, edema or irregular heartbeat Gastrointestinal ROS: negative for - abdominal pain, diarrhea, hematemesis, nausea/vomiting or stool incontinence Genito-Urinary ROS: negative for - dysuria, hematuria, incontinence or urinary frequency/urgency Musculoskeletal ROS: negative for - joint swelling or muscular  weakness Neurological ROS: as noted in HPI Dermatological ROS: negative for rash and skin lesion changes  Physical Examination: Blood pressure 109/50, pulse 73, temperature 97.9 F (36.6 C), temperature source Oral, resp. rate 19, height 5' 9"  (1.753 m), weight 79.918 kg (176 lb 3 oz), SpO2 100 %.  HEENT-  Normocephalic, no lesions, without obvious abnormality.  Normal external eye and conjunctiva.  Normal TM's bilaterally.  Normal auditory canals and external ears. Normal external nose, mucus membranes and septum.  Normal pharynx. Cardiovascular- S1, S2 normal, pulses palpable throughout   Lungs- chest clear,  no wheezing, rales, normal symmetric air entry Abdomen- soft, non-tender; bowel sounds normal; no masses,  no organomegaly Extremities- no edema Lymph-no adenopathy palpable Musculoskeletal-no joint tenderness, deformity or swelling Skin-warm and dry, no hyperpigmentation, vitiligo, or suspicious lesions  Neurological Examination Mental Status: Alert, unable to give month or age correctly.  Speech fluent but slow without evidence of aphasia.  Able to follow 3 step commands without difficulty. Cranial Nerves: II: Discs flat bilaterally; Visual fields grossly normal, pupils equal, round, reactive to light and accommodation III,IV, VI: ptosis not present, extra-ocular motions intact bilaterally V,VII: smile symmetric, facial light touch sensation normal bilaterally VIII: hearing normal bilaterally IX,X: gag reflex present XI: bilateral shoulder shrug XII: midline tongue extension Motor: Right : Upper extremity   5/5    Left:     Upper extremity   2/5  Lower extremity   5/5     Lower extremity   1-2/5 Increased tone in the LUE Sensory: Pinprick and light touch intact throughout, bilaterally Deep Tendon Reflexes: 2+ and symmetric with absent AJ's bilaterally Plantars: Right: downgoing   Left: downgoing Cerebellar: Unable to perform on the left.  Normal finger-to-nose on the  right.  Normal heel-to-shin testing on the right Gait: not tested due to safety concerns   Laboratory Studies:  Basic Metabolic Panel:  Recent Labs Lab 04/05/15 0506  NA 139  K 3.5  CL 100*  CO2 31  GLUCOSE 62*  BUN 15  CREATININE 0.98  CALCIUM 9.1    Liver Function Tests:  Recent Labs Lab 04/05/15 0506  AST 29  ALT 30  ALKPHOS 60  BILITOT 0.8  PROT 7.1  ALBUMIN 3.9   No results for input(s): LIPASE, AMYLASE in the last 168 hours. No results for input(s): AMMONIA in the last 168 hours.  CBC:  Recent Labs Lab 04/05/15 0506  WBC 10.8*  NEUTROABS 6.6*  HGB 15.5  HCT 45.8  MCV 92.9  PLT 161    Cardiac Enzymes:  Recent Labs Lab 04/05/15 0506  TROPONINI 0.03    BNP: Invalid input(s): POCBNP  CBG:  Recent Labs Lab 04/05/15 0503 04/05/15 0913 04/05/15 1058 04/05/15 1142 04/05/15 1402  GLUCAP 67 165* 160* 165* 315*    Microbiology: Results for orders placed or performed in visit on 10/23/14  Microscopic Examination     Status: None   Collection Time: 10/23/14 10:58 AM  Result Value Ref Range Status   WBC, UA 0-5 0 -  5 /hpf Final   RBC, UA 0-2 0 -  2 /hpf Final   Epithelial Cells (non renal) None seen 0 - 10 /hpf Final   Bacteria, UA None seen None seen/Few Final    Coagulation Studies:  Recent Labs  04/05/15 0506  LABPROT 12.3  INR 0.89    Urinalysis:  Recent Labs Lab 04/05/15 0536  COLORURINE STRAW*  LABSPEC 1.007  PHURINE 8.0  GLUCOSEU NEGATIVE  HGBUR 1+*  BILIRUBINUR NEGATIVE  KETONESUR NEGATIVE  PROTEINUR >500*  NITRITE NEGATIVE  LEUKOCYTESUR NEGATIVE    Lipid Panel:    Component Value Date/Time   CHOL 150 03/06/2014 1508   TRIG 110 03/06/2014 1508   HDL 41 03/06/2014 1508   VLDL 22 03/06/2014 1508   LDLCALC 87 03/06/2014 1508    HgbA1C: No results found for: HGBA1C  Urine Drug Screen:  No results found for: LABOPIA, COCAINSCRNUR, LABBENZ, AMPHETMU, THCU, LABBARB  Alcohol Level: No results for  input(s): ETH in the last 168 hours.  Other results: EKG: sinus rhythm at  54 bpm.  Imaging: Ct Head Wo Contrast  04/05/2015  CLINICAL DATA:  Acute onset of slurred speech and difficulty ambulating. Initial encounter. EXAM: CT HEAD WITHOUT CONTRAST TECHNIQUE: Contiguous axial images were obtained from the base of the skull through the vertex without intravenous contrast. COMPARISON:  CT of the head performed 07/23/2012, and MRI of the brain performed 12/09/2014 FINDINGS: There is no evidence of acute infarction, mass lesion, or intra- or extra-axial hemorrhage on CT. Prominence of the ventricles and sulci reflects mild cortical volume loss. Cerebellar atrophy is noted. Scattered periventricular and subcortical white matter change likely reflects small vessel ischemic microangiopathy. 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 Chronic lacunar infarcts are also noted at the pons. The fourth ventricle is within normal limits. The cerebral hemispheres demonstrate grossly normal gray-white differentiation. No mass effect or midline shift is seen. There is no evidence of fracture; bilateral bore holes are noted near the vertex. The visualized portions of the orbits are within normal limits. The paranasal sinuses and mastoid air cells are well-aerated. No significant soft tissue abnormalities are seen. IMPRESSION: 1. No acute intracranial pathology seen on CT. 2. Mild cortical volume loss and scattered small vessel ischemic microangiopathy. 3. 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 Chronic lacunar infarcts at the pons. Electronically Signed   By: Garald Balding M.D.   On: 04/05/2015 05:38   Mr Brain Wo Contrast  04/05/2015  CLINICAL DATA:  Cerebrum stroke EXAM: MRI HEAD WITHOUT CONTRAST TECHNIQUE: Multiplanar, multiecho pulse sequences of the brain and surrounding structures were obtained without intravenous contrast. COMPARISON:   12/09/2014 FINDINGS: Calvarium and upper cervical spine: No focal marrow signal abnormality. Remote bilateral burr holes. Orbits: Negative. Sinuses and Mastoids: Clear. Brain: There is a sub cm area of diffusion hyperintensity parasagittal left frontal lobe on coronal reformats, but not convincing on axial acquisition. The restriction is somewhat weak for acute infarct. Chronic small vessel disease with confluent ischemic gliosis in the cerebral white matter. Multiple remote lacunar infarcts, including in the right centrum semiovale, left corona radiata, right posterior limb internal capsule, and bilateral brainstem. Normal cerebral volume. No evidence of hemorrhage, hydrocephalus, or major vessel occlusion. IMPRESSION: 1. 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 2. 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 Electronically Signed   By: Monte Fantasia M.D.   On: 04/05/2015 14:55   Dg Esophagus  04/05/2015  CLINICAL DATA:  Long history of dysphagia with history of esophageal dilation ; TIA like symptoms last night. EXAM: ESOPHOGRAM / BARIUM SWALLOW / BARIUM TABLET STUDY TECHNIQUE: Combined double contrast and single contrast examination performed using effervescent crystals, thick barium liquid, and thin barium liquid. The patient was observed with fluoroscopy swallowing a 13 mm barium sulphate tablet. FLUOROSCOPY TIME:  Fluoroscopy Time:  1 minutes, 0 seconds Number of Acquired Images:  12 COMPARISON:  None in PACs FINDINGS: The patient ingested the thick and thin barium and gas forming crystals without difficulty. The cervical esophagus distended well. There was no laryngeal penetration of barium. There is no significant mass effect upon the posterior wall of the hypopharynx or proximal esophagus from previous fusion procedures in the cervical spine. The thoracic esophagus distended reasonably well but prominent tertiary  contractions were observed subsequently. There was no significant reflux. No evidence of ulceration was demonstrated. The barium tablet passed promptly to the GE junction where it hung. It ultimately passed with an  additional sip of barium and water. Survey views of the stomach and duodenum were normal. IMPRESSION: 1. No evidence of aspiration. Esophageal motility appears normal. Marked changes of presbyesophagus demonstrated. 2. Narrowing of the distal esophagus which initially will not allow passage of the barium tablet. It subsequently passed with additional barium and water. Electronically Signed   By: David  Martinique M.D.   On: 04/05/2015 09:24    Assessment: 75 y.o. male presenting with an episode of slurred speech and confusion that cleared.  Now with an episode of left sided weakness that has cleared as well.  Head CT personally reviewed and shows no acute changes.  Patient on ASA at home.  Stroke/TIA in the differential.  Seizure in the differential as well.    Stroke Risk Factors - diabetes mellitus, hypertension and sleep apnea  Plan: 1. HgbA1c, fasting lipid panel 2. MRI of the brain without contrast STAT 3. PT consult, OT consult, Speech consult 4. Echocardiogram today 5. CTA of the head and neck 6. Prophylactic therapy-Antiplatelet med: Plavix - dose 56m daily.  Would give today along with ASA. 7. NPO until RN stroke swallow screen 8. Telemetry monitoring 9. Frequent neuro checks 10. EEG 11. BP lower than baseline.  500cc NS bolus to be given now   LAlexis Goodell MD Neurology [phone]/20/2017, 3:05 PM  Addendum: MRI of the brain personally reviewed.  There is an area of questionable concern in the left frontal parasaggital region.  This is not consistent with the patient's presentation and therefore doubt that this represents an acute infarct.  Further work up recommended.    LAlexis Goodell MD Neurology [phone]

## 2015-04-05 NOTE — ED Notes (Signed)
Taking patient to CT

## 2015-04-05 NOTE — Progress Notes (Signed)
°   04/05/15 1400  Clinical Encounter Type  Visited With Patient and family together  Visit Type Code  Referral From Nurse  Consult/Referral To Chaplain  Spiritual Encounters  Spiritual Needs Prayer  Responded to code stroke.  Offered prayer and pastoral care for patient and spouse. [place] Ext [phone]

## 2015-04-05 NOTE — ED Notes (Signed)
Report attempted 

## 2015-04-05 NOTE — ED Notes (Signed)
  Patient has an insulin pump on medial right thigh.   Given patient's borderline hypoglycemia and NPO status, Dr. Dahlia Client was informed and insulin pump was removed per Dr. Pollyann Samples instructions.

## 2015-04-05 NOTE — Progress Notes (Signed)
*  PRELIMINARY RESULTS* Echocardiogram 2D Echocardiogram has been performed.  Richard Chapman Stills 04/05/2015, 9:27 PM

## 2015-04-05 NOTE — Care Management Obs Status (Signed)
[place] NOTIFICATION   Patient Details  Name: Richard Chapman MRN: [id number] Date of Birth: Oct 10, 1940   Medicare Observation Status Notification Given:  Yes (hypoglycemia resolved)    Ival Bible, RN 04/05/2015, 9:20 AM

## 2015-04-05 NOTE — Evaluation (Signed)
Physical Therapy Evaluation Patient Details Name: Richard Chapman MRN: [id number] DOB: 07-Jun-1940 Today's Date: 04/05/2015   History of Present Illness  presented to ER secondary to slurred speech, AMS; admitted under observation for management of hypoglycemia vs. TIA work-up.  Head CT negative for acute change.  FBSB currently 160  Clinical Impression  Upon evaluation, patient alert and oriented to basic information; follows simple commands and demonstrates fair insight/safety awareness. Bilat UE/LE strength and ROM grossly WFL; no focal weakness or sensory deficits appreciated.  Patient able to complete bed mobility with mod indep; sit/stand, basic transfers and gait (220') with RW, cga/close sup.  Mild higher-level balance deficits noted with head turns and changes of direction, though patient able to self-recover with LE step strategy. Do recommend continued use of RW vs. SPC with all mobility at this time for optimal safety/indep with all mobility efforts.  Patient/wife voiced understanding and agreement (has RW at home already). Would benefit from skilled PT to address above deficits and promote optimal return to PLOF while hospitalized; anticipate no skilled PT needs upon discharge.    Follow Up Recommendations No PT follow up (will maintain on caseload to ensure continued mobility; anticipate no formal PT needs upon discharge)    Equipment Recommendations       Recommendations for Other Services       Precautions / Restrictions Precautions Precautions: Fall Restrictions Weight Bearing Restrictions: No      Mobility  Bed Mobility Overal bed mobility: Independent                Transfers Overall transfer level: Needs assistance Equipment used: Rolling walker (2 wheeled) Transfers: Sit to/from Stand Sit to Stand: Min guard            Ambulation/Gait Ambulation/Gait assistance: Min guard;Supervision Ambulation Distance (Feet): 220 Feet Assistive device: Rolling  walker (2 wheeled)     Gait velocity interpretation: <1.8 ft/sec, indicative of risk for recurrent falls General Gait Details: reciprocal stepping pattern with fair cadence/gait speed; mild lateral sway with head turns and dynamic gait components (though able to self-correct with LE step strategy).  Do recommend continued use of RW  vs. SPC at this time; patient/wife voiced understanding.  Stairs            Wheelchair Mobility    Modified Rankin (Stroke Patients Only)       Balance Overall balance assessment: Needs assistance Sitting-balance support: No upper extremity supported;Feet supported Sitting balance-Leahy Scale: Good     Standing balance support: Bilateral upper extremity supported Standing balance-Leahy Scale: Fair                               Pertinent Vitals/Pain Pain Assessment: No/denies pain    Home Living Family/patient expects to be discharged to:: Private residence Living Arrangements: Spouse/significant other Available Help at Discharge: Family Type of Home: House Home Access: Stairs to enter Entrance Stairs-Rails: None Entrance Stairs-Number of Steps: 1 Home Layout: One level Home Equipment: Cane - single point;Walker - 2 wheels      Prior Function Level of Independence: Independent with assistive device(s)         Comments: Mod indep for household mobility with SPC (participates with limited community mobility); assist from wife for ADLs as needed.  Does endorse at least 3-4 falls in previous six months (wife reports some likely related to ETOH consumption at times)     Hand Dominance  Extremity/Trunk Assessment   Upper Extremity Assessment: Overall WFL for tasks assessed           Lower Extremity Assessment: Overall WFL for tasks assessed         Communication   Communication: No difficulties  Cognition Arousal/Alertness: Awake/alert Behavior During Therapy: WFL for tasks assessed/performed Overall  Cognitive Status: Within Functional Limits for tasks assessed                      General Comments      Exercises        Assessment/Plan    PT Assessment Patient needs continued PT services  PT Diagnosis Difficulty walking;Generalized weakness   PT Problem List Decreased mobility;Decreased balance  PT Treatment Interventions Gait training;Stair training;Functional mobility training;Therapeutic activities;Therapeutic exercise;Balance training;Patient/family education   PT Goals (Current goals can be found in the Care Plan section) Acute Rehab PT Goals Patient Stated Goal: to return home PT Goal Formulation: With patient/family Time For Goal Achievement: 04/19/15 Potential to Achieve Goals: Good    Frequency Min 2X/week   Barriers to discharge        Co-evaluation               End of Session Equipment Utilized During Treatment: Gait belt Activity Tolerance: Patient tolerated treatment well Patient left: in bed;with call bell/phone within reach;with chair alarm set;with family/visitor present Nurse Communication: Mobility status    Functional Assessment Tool Used: clinical judgement Functional Limitation: Mobility: Walking and moving around Mobility: Walking and Moving Around Current Status [phone]): At least 20 percent but less than 40 percent impaired, limited or restricted Mobility: Walking and Moving Around Goal Status [phone]): At least 1 percent but less than 20 percent impaired, limited or restricted    Time: 1141-1205 PT Time Calculation (min) (ACUTE ONLY): 24 min   Charges:   PT Evaluation $PT Eval Low Complexity: 1 Procedure     PT G Codes:   PT G-Codes **NOT FOR INPATIENT CLASS** Functional Assessment Tool Used: clinical judgement Functional Limitation: Mobility: Walking and moving around Mobility: Walking and Moving Around Current Status [id number] At least 20 percent but less than 40 percent impaired, limited or restricted Mobility:  Walking and Moving Around Goal Status [phone]): At least 1 percent but less than 20 percent impaired, limited or restricted    Sohrab Keelan H. Owens Shark, PT, DPT, NCS 04/05/2015, 1:35 PM [phone]

## 2015-04-05 NOTE — Progress Notes (Signed)
Wife reports that pt drinks 2 beers q night.  Sometimes having 3-4 beers q night.  Sometimes this results in falls. Will make MD aware. Clarise Cruz, RN

## 2015-04-05 NOTE — Progress Notes (Signed)
Code stroke called at 1348.  Dr. Doy Mince in room to assess pt.  Stat MRI ordered by Dr. Posey Pronto.  NIH at 1400 is 9, repeat NIH at 1405 0.  Fluid bolus given.  Dr. Doy Mince went to MRI with pt.  Clarise Cruz, RN

## 2015-04-06 DIAGNOSIS — I10 Essential (primary) hypertension: Secondary | ICD-10-CM | POA: Diagnosis present

## 2015-04-06 DIAGNOSIS — Z87442 Personal history of urinary calculi: Secondary | ICD-10-CM | POA: Diagnosis not present

## 2015-04-06 DIAGNOSIS — Z9889 Other specified postprocedural states: Secondary | ICD-10-CM | POA: Diagnosis not present

## 2015-04-06 DIAGNOSIS — E785 Hyperlipidemia, unspecified: Secondary | ICD-10-CM | POA: Diagnosis present

## 2015-04-06 DIAGNOSIS — R1310 Dysphagia, unspecified: Secondary | ICD-10-CM | POA: Diagnosis present

## 2015-04-06 DIAGNOSIS — E1165 Type 2 diabetes mellitus with hyperglycemia: Secondary | ICD-10-CM | POA: Diagnosis present

## 2015-04-06 DIAGNOSIS — Z833 Family history of diabetes mellitus: Secondary | ICD-10-CM | POA: Diagnosis not present

## 2015-04-06 DIAGNOSIS — E11649 Type 2 diabetes mellitus with hypoglycemia without coma: Secondary | ICD-10-CM | POA: Diagnosis present

## 2015-04-06 DIAGNOSIS — Z794 Long term (current) use of insulin: Secondary | ICD-10-CM | POA: Diagnosis not present

## 2015-04-06 DIAGNOSIS — G459 Transient cerebral ischemic attack, unspecified: Secondary | ICD-10-CM | POA: Diagnosis present

## 2015-04-06 DIAGNOSIS — Z823 Family history of stroke: Secondary | ICD-10-CM | POA: Diagnosis not present

## 2015-04-06 DIAGNOSIS — Z7982 Long term (current) use of aspirin: Secondary | ICD-10-CM | POA: Diagnosis not present

## 2015-04-06 DIAGNOSIS — Z9641 Presence of insulin pump (external) (internal): Secondary | ICD-10-CM | POA: Diagnosis present

## 2015-04-06 DIAGNOSIS — I639 Cerebral infarction, unspecified: Secondary | ICD-10-CM | POA: Diagnosis present

## 2015-04-06 DIAGNOSIS — K219 Gastro-esophageal reflux disease without esophagitis: Secondary | ICD-10-CM | POA: Diagnosis present

## 2015-04-06 DIAGNOSIS — M4722 Other spondylosis with radiculopathy, cervical region: Secondary | ICD-10-CM | POA: Diagnosis present

## 2015-04-06 DIAGNOSIS — Z8249 Family history of ischemic heart disease and other diseases of the circulatory system: Secondary | ICD-10-CM | POA: Diagnosis not present

## 2015-04-06 DIAGNOSIS — Z9049 Acquired absence of other specified parts of digestive tract: Secondary | ICD-10-CM | POA: Diagnosis not present

## 2015-04-06 DIAGNOSIS — Z87891 Personal history of nicotine dependence: Secondary | ICD-10-CM | POA: Diagnosis not present

## 2015-04-06 DIAGNOSIS — Z85828 Personal history of other malignant neoplasm of skin: Secondary | ICD-10-CM | POA: Diagnosis not present

## 2015-04-06 DIAGNOSIS — G458 Other transient cerebral ischemic attacks and related syndromes: Secondary | ICD-10-CM | POA: Diagnosis present

## 2015-04-06 DIAGNOSIS — Z79899 Other long term (current) drug therapy: Secondary | ICD-10-CM | POA: Diagnosis not present

## 2015-04-06 DIAGNOSIS — G250 Essential tremor: Secondary | ICD-10-CM | POA: Diagnosis present

## 2015-04-06 DIAGNOSIS — R262 Difficulty in walking, not elsewhere classified: Secondary | ICD-10-CM | POA: Diagnosis present

## 2015-04-06 LAB — LIPID PANEL
CHOL/HDL RATIO: 3.8 ratio
Cholesterol: 140 mg/dL (ref 0–200)
HDL: 37 mg/dL — ABNORMAL LOW (ref >40)
LDL Cholesterol: 68 mg/dL (ref 0–99)
Triglycerides: 173 mg/dL — ABNORMAL HIGH (ref <150)
VLDL: 35 mg/dL (ref 0–40)

## 2015-04-06 LAB — HEMOGLOBIN A1C: Hgb A1c MFr Bld: 8.5 % — ABNORMAL HIGH (ref 4.0–6.0)

## 2015-04-06 LAB — GLUCOSE, CAPILLARY
GLUCOSE-CAPILLARY: 223 mg/dL — AB (ref 65–99)
GLUCOSE-CAPILLARY: 437 mg/dL — AB (ref 65–99)
Glucose-Capillary: 362 mg/dL — ABNORMAL HIGH (ref 65–99)
Glucose-Capillary: 271 mg/dL — ABNORMAL HIGH (ref 65–99)

## 2015-04-06 MED ORDER — INSULIN ASPART 100 UNIT/ML ~~LOC~~ SOLN: 6.0000 [IU] | Freq: Three times a day (TID) | SUBCUTANEOUS | Status: DC

## 2015-04-06 MED ORDER — INSULIN GLARGINE 100 UNIT/ML ~~LOC~~ SOLN
30.0000 [IU] | Freq: Every day | SUBCUTANEOUS | Status: DC
  Administered 2015-04-06: 30 [IU] via SUBCUTANEOUS
  Filled 2015-04-06 (×2): qty 0.3

## 2015-04-06 MED ORDER — METFORMIN HCL 500 MG PO TABS
1000.0000 mg | ORAL_TABLET | Freq: Two times a day (BID) | ORAL | Status: DC
  Administered 2015-04-06 – 2015-04-07 (×2): 1000 mg via ORAL
  Filled 2015-04-06 (×2): qty 2

## 2015-04-06 MED ORDER — INSULIN ASPART 100 UNIT/ML ~~LOC~~ SOLN
4.0000 [IU] | Freq: Three times a day (TID) | SUBCUTANEOUS | Status: DC
  Administered 2015-04-06 – 2015-04-07 (×3): 4 [IU] via SUBCUTANEOUS
  Filled 2015-04-06 (×2): qty 4

## 2015-04-06 MED ORDER — INSULIN ASPART 100 UNIT/ML ~~LOC~~ SOLN
0.0000 [IU] | Freq: Three times a day (TID) | SUBCUTANEOUS | Status: DC
  Administered 2015-04-06: 8 [IU] via SUBCUTANEOUS
  Administered 2015-04-07: 4 [IU] via SUBCUTANEOUS
  Filled 2015-04-06: qty 8
  Filled 2015-04-06: qty 4

## 2015-04-06 MED ORDER — HYDROCODONE-ACETAMINOPHEN 5-325 MG PO TABS
1.0000 | ORAL_TABLET | ORAL | Status: DC | PRN
  Administered 2015-04-06 – 2015-04-07 (×3): 2 via ORAL
  Filled 2015-04-06 (×3): qty 2

## 2015-04-06 NOTE — Consult Note (Addendum)
Endocrine Initial Consult Note Date of Consult: 04/06/2015  Consulting Service: [facility] Endocrinology  Service Requesting Consult: Posey Pronto, Chauncey Cruel  SUBJECTIVE: Reason for Consultation: labile blood sugars  History of Present Illness: Richard Chapman is a 75 y.o. male with PMH Type 2 Diabetes, HTN, HPL, and multiple other medical comorbidities who was admitted from home with altered mental status.  His wife found him confused and slurring his speech around 3 AM the morning of admission.  She reports at that time his blood sugar was 82.  By the time he was brought in by EMS, blood glucose was 62.  The patient and wife both deny history of hypoglycemia.  This is not a regular occurrence.  The patient is followed by his endocrinologist Dr. Eddie Dibbles whom he saw last month.  His diabetes is managed on a regimen of Metformin 1000 mg twice daily and Vgo 30 insulin pump.  He receives 30 units of basal insulin daily. He normally boluses 4-8 units of Humalog at mealtimes if he is having a heavy enough meal.  He denies any recent decrease in appetite, abdominal pain, nausea, vomiting, or illness.  He ate his usual amount the evening prior to admission.    The patient believes he is back at his baseline so does his wife.  She reports that normally he is able to manage his insulin pump without any issues.  Since admission, the insulin pump has been turned off and his blood sugars have been managed with sliding scale insulin alone.  Endocrinology has been consulted for assistance managing hyperglycemia with blood glucose 400 range this afternoon.  Patient Active Problem List   Diagnosis Date Noted  . CVA (cerebral infarction) 04/06/2015  . Malignant neoplasm of prostate ([place]) 01/29/2015  . Cervical disc disease 12/03/2014  . Chest pain 11/25/2014  . Arthritis 08/20/2014  . Benign fibroma of prostate 08/20/2014  . Clinical depression 08/20/2014  . Failure of erection 08/20/2014  . Enlarged prostate 08/20/2014   . Major depressive disorder with single episode ([place]) 08/20/2014  . 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  . Benign essential HTN 08/04/2014  . Acute low back pain 04/23/2014  . Alteration in bowel elimination: incontinence 04/23/2014  . Urge incontinence 04/23/2014  . 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 Benign essential tremor 12/23/2013  . B12 deficiency 08/13/2013  . Chronic kidney disease (CKD), stage III (moderate) 08/13/2013  . Diabetes ([place]) 08/13/2013  . Hypoglycemia 08/13/2013  . Diabetes mellitus ([place]) 08/13/2013  . Type 2 diabetes mellitus ([place]) 08/13/2013  . Arteriosclerosis of coronary artery 07/24/2013  . Fatigue 07/24/2013  . Acid reflux 07/24/2013  . Combined fat and carbohydrate induced hyperlipemia 07/24/2013  . CAD in native artery 07/24/2013  . Gastro-esophageal reflux disease without esophagitis 07/24/2013  . 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 Subdural hematoma ([place]) 03/26/2013  . Traumatic subdural hemorrhage ([place]) 03/26/2013  . Abnormal prostate specific antigen 09/01/2012  . Benign prostatic hyperplasia with urinary obstruction 09/01/2012  . Elevated prostate specific antigen (PSA) 09/01/2012     Past Medical History  Diagnosis Date  . History of nephrolithiasis   . GERD (gastroesophageal reflux disease)   . Anxiety and depression   . Skin cancer   . Depression   . Diabetes ([place])   . Hypertension   . Sleep apnea   . Elevated PSA    Past Surgical History  Procedure Laterality Date  . Tonsillectomy    . Knee surgery Bilateral   . Shoulder surgery    .  Back surgery    . Neck surgery    . Appendectomy    . Wrist surgery     Family History  Problem Relation Age of Onset  . Heart disease Mother   . Heart disease Father   . Diabetes Father   . Stroke Father   . Kidney disease Neg Hx   . Prostate cancer Neg Hx     Social History:  Social History  Substance Use Topics  . Smoking status: Former Smoker     Quit date: 05/21/2014  . Smokeless tobacco: Not on file  . Alcohol Use: 1.2 oz/week    2 Standard drinks or equivalent per week     No Known Allergies   Medications:  No current facility-administered medications on file prior to encounter.   Current Outpatient Prescriptions on File Prior to Encounter  Medication Sig Dispense Refill  . acetaminophen (TYLENOL) 325 MG tablet Take 2 tablets (650 mg total) by mouth every 6 (six) hours as needed for mild pain (or Fever >/= 101).    . amLODipine (NORVASC) 5 MG tablet Take 1 tablet (5 mg total) by mouth daily. 30 tablet 0  . aspirin 81 MG tablet Take 1 tablet (81 mg total) by mouth daily. 30 tablet 0  . atorvastatin (LIPITOR) 10 MG tablet Take 5 mg by mouth daily.    . docusate sodium (COLACE) 100 MG capsule Take 1 capsule (100 mg total) by mouth 2 (two) times daily as needed for mild constipation. 10 capsule 0  . escitalopram (LEXAPRO) 20 MG tablet Take 1 tablet (20 mg total) by mouth daily. 30 tablet 0  . esomeprazole (NEXIUM) 20 MG capsule Take 1 capsule (20 mg total) by mouth AC breakfast. 30 capsule 0  . furosemide (LASIX) 40 MG tablet Take 1 tablet (40 mg total) by mouth daily. 30 tablet 0  . gabapentin (NEURONTIN) 100 MG capsule Take 100 mg by mouth at bedtime.     . Insulin Disposable Pump (V-GO 30) KIT     . insulin lispro (HUMALOG) 100 UNIT/ML injection Inject 76 Units into the skin daily. Via VGo kit    . isosorbide mononitrate (IMDUR) 60 MG 24 hr tablet Take 1 tablet (60 mg total) by mouth 2 (two) times daily. (Patient taking differently: Take 60 mg by mouth daily. ) 60 tablet 0  . metFORMIN (GLUCOPHAGE) 1000 MG tablet Take 1 tablet (1,000 mg total) by mouth daily with breakfast. (Patient taking differently: Take 1,000 mg by mouth 2 (two) times daily with a meal. ) 30 tablet 0  . nitroGLYCERIN (NITROSTAT) 0.4 MG SL tablet Place 0.4 mg under the tongue every 5 (five) minutes as needed for chest pain.     . primidone (MYSOLINE) 50 MG  tablet Take 1 tablet by mouth 2 (two) times daily.    . propranolol (INDERAL) 20 MG tablet Take 20 mg by mouth 2 (two) times daily.     . HYDROcodone-acetaminophen (NORCO/VICODIN) 5-325 MG tablet Take 1 tablet by mouth every 6 (six) hours as needed for moderate pain.      Review of Systems: Pertinent items are noted in HPI. Otherwise 10 pt ROS was negative.  OBJECTIVE: Temp:  [97.5 F (36.4 C)-98.2 F (36.8 C)] 98.1 F (36.7 C) (02/21 1327) Pulse Rate:  [54-65] 61 (02/21 1327) Resp:  [18-20] 20 (02/21 1327) BP: (117-171)/(43-71) 140/59 mmHg (02/21 1327) SpO2:  [94 %-96 %] 94 % (02/21 1327)  Temp (24hrs), Avg:97.9 F (36.6 C), Min:97.5 F (36.4 C), Max:98.2   F (36.8 C)  Weight: 79.918 kg (176 lb 3 oz)  Physical Exam: Gen: no acute distress, well-nourished, well-appearing elderly man HEENT: [place]/AT, eyes anicteric, EOMI, mucous membranes moist, no oropharyngeal lesions Neck: no thyroid enlargement or nodules noted, no cervical lymphadenopathy CAD: regular rate, regular rhythm, s1, s2 PULM: clear to ausculation, no wheezes, rhonchi or rales. GI: soft, non tender, non distended. EXT: no clubbing, cyanosis or edema   Skin: warm, dry, no rash Neuro: grossly non focal, normal DTRs, alert and oriented x 3  Laboratory data:  CBC Latest Ref Rng 04/05/2015 11/25/2014 03/06/2014  WBC 3.8 - 10.6 K/uL 10.8(H) 13.0(H) 8.9  Hemoglobin 13.0 - 18.0 g/dL 15.5 14.3 12.7(L)  Hematocrit 40.0 - 52.0 % 45.8 41.9 38.8(L)  Platelets 150 - 440 K/uL 161 158 152   BMP Latest Ref Rng 04/05/2015 11/25/2014 03/06/2014  Glucose 65 - 99 mg/dL 62(L) 213(H) 69  BUN 6 - 20 mg/dL _0 Creatinine 0.61 - 1.24 mg/dL 0.98 1.13 1.42(H)  Sodium 135 - 145 mmol/L 139 135 141  Potassium 3.5 - 5.1 mmol/L 3.5 3.3(L) 3.8  Chloride 101 - 111 mmol/L 100(L) 99(L) 104  CO2 22 - 32 mmol/L _1 Calcium 8.9 - 10.3 mg/dL 9.1 9.3 8.8   Component     Latest Ref Rng 04/06/2015  Hemoglobin A1C     4.0 - 6.0 % 8.5 (H)    Blood glucose values reviewed in glucose accordion view  ASSESSMENT:  1. Type 2 Diabetes, uncontrolled 2. Hypoglycemia  RECOMMENDATIONS:  Recommend holding the Vgo insulin pump while inpatient and undergoing evaluation for altered mental status.   Start Lantus insulin 30 units once daily now. Start NovoLog 4 units with meals 3 times daily. Once diet advanced from liquids, will likely need 6-8 units of novolog with meals. Correction scale NovoLog for blood sugars greater than 200 mg/dL.  Check fingerstick blood glucose before meals and at bedtime. Check a 3 AM blood glucose to screen for hypoglycemia.  Resume his home regimen of metformin 1000 mg twice daily. Upon discharge, would transition the patient back to his outpatient insulin pump regimen.  He should follow-up with Dr. Eddie Dibbles within 2-4 weeks of discharge. Will follow along.  Thank you for this consult.   Atha Starks, MD [facility] Endocrinology

## 2015-04-06 NOTE — Progress Notes (Signed)
Spoke with Dr. Posey Pronto to make her aware of pt's CBG of 437.  Dr. Posey Pronto wants pt back on insulin pump at his normal basal amount.  Wife contacted to bring in pump.  Almyra Free from Glycemic control team contacted and suggested pt receive 9 unit SSI until pump arrives.  CBG will be check before pump placed back on pt.  Clarise Cruz, RN

## 2015-04-06 NOTE — Progress Notes (Signed)
Pt back from EEG.  Clarise Cruz, RN

## 2015-04-06 NOTE — Progress Notes (Signed)
Received page from RN regarding glucose of 437 mg/dl at 11:56 am today. Patient had an insulin pump which was removed when patient was admitted yesterday and patient was order Novolog SQ correction. According to the RN, patient's wife took his insulin pump home and will bring it back to the hospital shortly. MD has ordered that the patient resume his insulin pump and RN questioned if Novolog correction should be given now. Advised RN to give Novolog 9 units now. Diabetes Coordinator will follow up on patient today.--MB

## 2015-04-06 NOTE — Evaluation (Signed)
Clinical/Bedside Swallow Evaluation Patient Details  Name: Richard Chapman MRN: [id number] Date of Birth: 07-30-1940  Today's Date: 04/06/2015 Time: SLP Start Time (ACUTE ONLY): 0815 SLP Stop Time (ACUTE ONLY): 0915 SLP Time Calculation (min) (ACUTE ONLY): 60 min  Past Medical History:  Past Medical History  Diagnosis Date  . History of nephrolithiasis   . GERD (gastroesophageal reflux disease)   . Anxiety and depression   . Skin cancer   . Depression   . Diabetes ([place])   . Hypertension   . Sleep apnea   . Elevated PSA    Past Surgical History:  Past Surgical History  Procedure Laterality Date  . Tonsillectomy    . Knee surgery Bilateral   . Shoulder surgery    . Back surgery    . Neck surgery    . Appendectomy    . Wrist surgery     HPI:  Pt is a 75 y.o. male with a known history of type 2 diabetes on insulin pump, hypertension, essential tremor, ETOH abuse, GERD, hyperlipidemia comes to the emergency room accompanied by patient's wife. According to the wife patient was found on the recliner around 3 AM. He appeared confused had some slurred speech and did not know his whereabouts. He was surprised to find the EMTs in his house early hours of the morning. His sugars was 62 when checked by EMS. Was brought to the emergency room at present is hemodynamically stable alert oriented 3. Denies any focal weakness or any slurred speech or any blurred vision. CT of head showed no new infarcts. Wife reports patient having difficulty swallowing on and off with coughing intermittently with meals. He has had history of esophageal dilatation in the remote past. Patient denies any chest pain or shortness of breath. He is being admitted with hypoglycemia likely causing symptoms versus TIA. Pt had a brief change of status yesterday PM but it resolved at that time and wife stated he is back to his baseline. Of note, a Barium Swallow study(GI) was peformed post admission per SLP recommendation to MD  based on pt's complaints. Barium study revealed No evidence of aspiration. Marked changes of presbyesophagus demonstrated. Narrowing of the distal esophagus which initially will not allow passage of the barium tablet. It subsequently passed with additional w/ additional barium and water. This could be directly related to pt's c/o cough and difficulty swallowing w/ meals.    Assessment / Plan / Recommendation Clinical Impression  Pt appears at reduced risk for aspiration from an oropharyngeal phase standpoint, however, pt is at increased risk for Reflux and aspiration of Reflux d/t Esophageal dysmotility and recent Barium study results. Pt consumed po's of thin liquids and soft solids w/ no overt s/s of aspiration and no apparent oral phase deficits. Pt fed self; ate quickly often not taking much time b/t bites of foods. Wife stated he eats quickly and larger bites at home. Suspect this is impacting his Esophageal motility and increasing his discomfort w/ meals. Thorough education on Reflux precautions and options of foods, strategies, and monitoring of behaviors during meals - mostly just stopping eating and taking rest breaks during meals to allow for motility and digestion. This was modeled for pt by SLP, and instructions given to wife as well. Pt agreed verbally and exhibited understanding by follow through w/ instruction w/ SLP's intermittent verbal cues. Handouts on Reflux given; eating behaviors. No further skilled ST services indicated at this time. MD updated and agreed. Pt/wife agreed. Rec. a Dys. 3  diet w/ thin liquids; meds in puree - crushed as able to allow for easier motility into the Stomach; monitoring at meals for follow through w/ Reflux precautions.     Aspiration Risk   (reduced following precautions)    Diet Recommendation  Dys. 3 w/ thin liquids; aspiration precautions; Reflux precautions and monitoring during meals for follow through w/ recommendations  Medication Administration:  Crushed with puree (for easier passage through the esophagus)    Other  Recommendations Recommended Consults: Consider GI evaluation;Consider esophageal assessment Oral Care Recommendations: Oral care BID;Patient independent with oral care   Follow up Recommendations       Frequency and Duration            Prognosis Prognosis for Safe Diet Advancement: Good Barriers to Reach Goals: Cognitive deficits (baseline per MD)      Swallow Study   General Date of Onset: 04/05/15 HPI: Pt is a 75 y.o. male with a known history of type 2 diabetes on insulin pump, hypertension, essential tremor, ETOH abuse, GERD, hyperlipidemia comes to the emergency room accompanied by patient's wife. According to the wife patient was found on the recliner around 3 AM. He appeared confused had some slurred speech and did not know his whereabouts. He was surprised to find the EMTs in his house early hours of the morning. His sugars was 62 when checked by EMS. Was brought to the emergency room at present is hemodynamically stable alert oriented 3. Denies any focal weakness or any slurred speech or any blurred vision. CT of head showed no new infarcts. Wife reports patient having difficulty swallowing on and off with coughing intermittently with meals. He has had history of esophageal dilatation in the remote past. Patient denies any chest pain or shortness of breath. He is being admitted with hypoglycemia likely causing symptoms versus TIA. Pt had a brief change of status yesterday PM but it resolved at that time and wife stated he is back to his baseline. Of note, a Barium Swallow study(GI) was peformed post admission per SLP recommendation to MD based on pt's complaints. Barium study revealed No evidence of aspiration. Marked changes of presbyesophagus demonstrated. Narrowing of the distal esophagus which initially will not allow passage of the barium tablet. It subsequently passed with additional w/ additional barium and  water. This could be directly related to pt's c/o cough and difficulty swallowing w/ meals.  Type of Study: Bedside Swallow Evaluation Previous Swallow Assessment: Barium swallow study Diet Prior to this Study: Regular;Thin liquids Temperature Spikes Noted: No (wbc 10.8) Respiratory Status: Room air History of Recent Intubation: No Behavior/Cognition: Alert;Cooperative;Pleasant mood;Requires cueing Oral Cavity Assessment: Within Functional Limits Oral Care Completed by SLP: No Oral Cavity - Dentition: Adequate natural dentition Vision: Functional for self-feeding Self-Feeding Abilities: Able to feed self Patient Positioning:  (sat EOB) Baseline Vocal Quality: Normal Volitional Cough: Strong Volitional Swallow: Able to elicit    Oral/Motor/Sensory Function Overall Oral Motor/Sensory Function: Within functional limits   Ice Chips Ice chips: Within functional limits Presentation: Spoon (fed; 2 trials)   Thin Liquid Thin Liquid: Within functional limits Presentation: Cup;Self Fed;Straw Other Comments: ~6 ozs    Nectar Thick Nectar Thick Liquid: Not tested   Honey Thick Honey Thick Liquid: Not tested   Puree Puree: Within functional limits Presentation: Self Fed;Spoon Other Comments: 2-3 ozs   Solid   GO   Solid: Within functional limits (soft solids) Presentation: Self Fed;Spoon Other Comments: full plate of foods    Functional Assessment Tool Used:  clinical judgement Functional Limitations: Swallowing Swallow Current Status [phone]): At least 1 percent but less than 20 percent impaired, limited or restricted Swallow Goal Status [phone]): At least 1 percent but less than 20 percent impaired, limited or restricted Swallow Discharge Status [phone]): At least 1 percent but less than 20 percent impaired, limited or restricted    Orinda Kenner, MS, CCC-SLP  Prentice Sackrider 04/06/2015,9:57 AM

## 2015-04-06 NOTE — Progress Notes (Signed)
Inpatient Diabetes Program Recommendations  AACE/ADA: New Consensus Statement on Inpatient Glycemic Control (2015)  Target Ranges:  Prepandial:   less than 140 mg/dL      Peak postprandial:   less than 180 mg/dL (1-2 hours)      Critically ill patients:  140 - 180 mg/dL   Review of Glycemic Control  Results for Richard Chapman, Richard Chapman (MRN [id number]) as of 04/06/2015 13:28  Ref. Range 04/05/2015 14:02 04/05/2015 16:08 04/05/2015 21:54 04/06/2015 07:46 04/06/2015 11:56  Glucose-Capillary Latest Ref Range: 65-99 mg/dL 315 (H) 323 (H) 191 (H) 223 (H) 437 (H)    Diabetes history: Type 2 Outpatient Diabetes medications: Vgo 30 with Humalog insulin up to 76 units per day, Metformin 1000mg  bid Current orders for Inpatient glycemic control: Novolog 0-9 units tid with meals  Inpatient Diabetes Program Recommendations: Patient reports that he wants to go back on the VG0 - basal insulin 30 units/day.  Concerned about restarting it now since patient had a low blood sugar.  Noted that blood sugars are now high.   Consider starting Lantus 16 units now and qday.  Consider adding Novolog 5 units tid with meals (hold if patient eats less than 50%) and increase Novolog correction to moderate correction tid and Novolog 0-5 units qhs.   Spoke with Dr. Posey Pronto re: my recommendations.  She is interested in having Dr. Gabriel Carina see the patient. Updated RN Andee Poles.   Gentry Fitz, RN, BA, MHA, CDE Diabetes Coordinator Inpatient Diabetes Program  [phone] (Team Pager) [phone] ([place]) 04/06/2015 1:58 PM

## 2015-04-06 NOTE — Progress Notes (Signed)
[facility] NAME: Richard Chapman    MR#:  [id number]  DATE OF BIRTH:  [birth year]  SUBJECTIVE:  Physical better today. No focal weakness. Sugars are increasing. Eating well.  REVIEW OF SYSTEMS:   Review of Systems  Constitutional: Negative for fever, chills and weight loss.  HENT: Negative for ear discharge, ear pain and nosebleeds.   Eyes: Negative for blurred vision, pain and discharge.  Respiratory: Negative for sputum production, shortness of breath, wheezing and stridor.   Cardiovascular: Negative for chest pain, palpitations, orthopnea and PND.  Gastrointestinal: Negative for nausea, vomiting, abdominal pain and diarrhea.  Genitourinary: Negative for urgency and frequency.  Musculoskeletal: Negative for back pain and joint pain.  Neurological: Negative for sensory change, speech change, focal weakness and weakness.  Psychiatric/Behavioral: Negative for depression and hallucinations. The patient is not nervous/anxious.    Tolerating Diet: Yes Tolerating PT: Seen by physical therapy no PT needs  DRUG ALLERGIES:  No Known Allergies  VITALS:  Blood pressure 171/59, pulse 56, temperature 97.7 F (36.5 C), temperature source Oral, resp. rate 20, height 5\' 9"  (1.753 m), weight 79.918 kg (176 lb 3 oz), SpO2 95 %.  PHYSICAL EXAMINATION:   Physical Exam  GENERAL:  75 y.o.-year-old patient lying in the bed with no acute distress.  EYES: Pupils equal, round, reactive to light and accommodation. No scleral icterus. Extraocular muscles intact.  HEENT: Head atraumatic, normocephalic. Oropharynx and nasopharynx clear.  NECK:  Supple, no jugular venous distention. No thyroid enlargement, no tenderness.  LUNGS: Normal breath sounds bilaterally, no wheezing, rales, rhonchi. No use of accessory muscles of respiration.  CARDIOVASCULAR: S1, S2 normal. No murmurs, rubs, or gallops.  ABDOMEN: Soft, nontender, nondistended. Bowel sounds  present. No organomegaly or mass.  EXTREMITIES: No cyanosis, clubbing or edema b/l.    NEUROLOGIC: Cranial nerves II through XII are intact. No focal Motor or sensory deficits b/l.   PSYCHIATRIC:  patient is alert and oriented x 3.  SKIN: No obvious rash, lesion, or ulcer.   LABORATORY PANEL:  CBC  Recent Labs Lab 04/05/15 0506  WBC 10.8*  HGB 15.5  HCT 45.8  PLT 161    Chemistries   Recent Labs Lab 04/05/15 0506  NA 139  K 3.5  CL 100*  CO2 31  GLUCOSE 62*  BUN 15  CREATININE 0.98  CALCIUM 9.1  AST 29  ALT 30  ALKPHOS 60  BILITOT 0.8   Cardiac Enzymes  Recent Labs Lab 04/05/15 0506  TROPONINI 0.03   RADIOLOGY:  Ct Angio Head W/cm &/or Wo Cm  04/05/2015  CLINICAL DATA:  TIA with transient slurred speech. EXAM: CT ANGIOGRAPHY HEAD AND NECK TECHNIQUE: Multidetector CT imaging of the head and neck was performed using the standard protocol during bolus administration of intravenous contrast. Multiplanar CT image reconstructions and MIPs were obtained to evaluate the vascular anatomy. Carotid stenosis measurements (when applicable) are obtained utilizing NASCET criteria, using the distal internal carotid diameter as the denominator. CONTRAST:  80mL OMNIPAQUE IOHEXOL 350 MG/ML SOLN COMPARISON:  None. FINDINGS: CT HEAD Brain: By CT there is no visible infarct. No acute hemorrhage. Chronic small-vessel disease with extensive gliosis throughout the cerebral white matter and remote lacunar infarcts in the bilateral brainstem, right thalamus/ internal capsule, and bilateral cerebral white matter. No hydrocephalus. Calvarium and skull base: Remote bilateral burr hole, 2 on each side. Paranasal sinuses: Clear Orbits: Cataract resections.  No acute finding CTA NECK Aortic arch: Minimally imaged.  Three vessel branching Right carotid system: No notable atheromatous changes. No dissection or ulcerated plaque. No stenosis. Left carotid system: Bulky calcified plaque at the bifurcation  with less than 50% stenosis. No dissection or ulcerated plaque. Vertebral arteries:Non dominant left vertebral artery with moderate to advanced origin stenosis. Both vertebral arteries are obscured twice in the V2 segment due to discectomy hardware. Skeleton: No contributory finding. Cervical spine degeneration with 3 level ACDF. Other neck: 28 mm right thyroid mass which is mildly grown since 2014 (23 mm). No invasive features. CTA HEAD Limited by venous contamination. Anterior circulation: Symmetric carotid arteries and branching. Carotid siphon atherosclerosis with notable plaque on the inferior wall of the bilateral mid cavernous segments without flow limiting stenosis. Tandem mild and then high-grade stenoses in the distal right M1 and M1 -M2 segments (inferior division branch) respectively. These are best seen mip reformats. Posterior circulation: No major branch occlusion or proximal flow limiting stenosis. Atherosclerotic change with notable high-grade narrowing at the left P4 segment. Venous sinuses: Patent Anatomic variants: None significant. Small anterior communicating artery. No visible posterior communicating arteries. Delayed phase: No parenchymal enhancement. IMPRESSION: 1. No acute arterial finding. 2. Cervical carotid atherosclerosis without flow limiting stenosis. 3. 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 4. Intracranial atherosclerosis with notable high-grade right M1 -M2 segment stenosis. 5. 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 6. Slowly grown right thyroid nodule, now 28 mm. If appropriate for comorbidities, outpatient thyroid ultrasound could evaluate before biopsy. Electronically Signed   By: Monte Fantasia M.D.   On: 04/05/2015 17:25   Ct Head Wo Contrast  04/05/2015  CLINICAL DATA:  Acute onset of slurred speech and difficulty ambulating. Initial encounter. EXAM: CT HEAD WITHOUT CONTRAST TECHNIQUE: Contiguous axial images  were obtained from the base of the skull through the vertex without intravenous contrast. COMPARISON:  CT of the head performed 07/23/2012, and MRI of the brain performed 12/09/2014 FINDINGS: There is no evidence of acute infarction, mass lesion, or intra- or extra-axial hemorrhage on CT. Prominence of the ventricles and sulci reflects mild cortical volume loss. Cerebellar atrophy is noted. Scattered periventricular and subcortical white matter change likely reflects small vessel ischemic microangiopathy. 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 Chronic lacunar infarcts are also noted at the pons. The fourth ventricle is within normal limits. The cerebral hemispheres demonstrate grossly normal gray-white differentiation. No mass effect or midline shift is seen. There is no evidence of fracture; bilateral bore holes are noted near the vertex. The visualized portions of the orbits are within normal limits. The paranasal sinuses and mastoid air cells are well-aerated. No significant soft tissue abnormalities are seen. IMPRESSION: 1. No acute intracranial pathology seen on CT. 2. Mild cortical volume loss and scattered small vessel ischemic microangiopathy. 3. 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 Chronic lacunar infarcts at the pons. Electronically Signed   By: Garald Balding M.D.   On: 04/05/2015 05:38   Ct Angio Neck W/cm &/or Wo/cm  04/05/2015  CLINICAL DATA:  TIA with transient slurred speech. EXAM: CT ANGIOGRAPHY HEAD AND NECK TECHNIQUE: Multidetector CT imaging of the head and neck was performed using the standard protocol during bolus administration of intravenous contrast. Multiplanar CT image reconstructions and MIPs were obtained to evaluate the vascular anatomy. Carotid stenosis measurements (when applicable) are obtained utilizing NASCET criteria, using the distal internal carotid diameter as the denominator. CONTRAST:   52mL OMNIPAQUE IOHEXOL 350  MG/ML SOLN COMPARISON:  None. FINDINGS: CT HEAD Brain: By CT there is no visible infarct. No acute hemorrhage. Chronic small-vessel disease with extensive gliosis throughout the cerebral white matter and remote lacunar infarcts in the bilateral brainstem, right thalamus/ internal capsule, and bilateral cerebral white matter. No hydrocephalus. Calvarium and skull base: Remote bilateral burr hole, 2 on each side. Paranasal sinuses: Clear Orbits: Cataract resections.  No acute finding CTA NECK Aortic arch: Minimally imaged.  Three vessel branching Right carotid system: No notable atheromatous changes. No dissection or ulcerated plaque. No stenosis. Left carotid system: Bulky calcified plaque at the bifurcation with less than 50% stenosis. No dissection or ulcerated plaque. Vertebral arteries:Non dominant left vertebral artery with moderate to advanced origin stenosis. Both vertebral arteries are obscured twice in the V2 segment due to discectomy hardware. Skeleton: No contributory finding. Cervical spine degeneration with 3 level ACDF. Other neck: 28 mm right thyroid mass which is mildly grown since 2014 (23 mm). No invasive features. CTA HEAD Limited by venous contamination. Anterior circulation: Symmetric carotid arteries and branching. Carotid siphon atherosclerosis with notable plaque on the inferior wall of the bilateral mid cavernous segments without flow limiting stenosis. Tandem mild and then high-grade stenoses in the distal right M1 and M1 -M2 segments (inferior division branch) respectively. These are best seen mip reformats. Posterior circulation: No major branch occlusion or proximal flow limiting stenosis. Atherosclerotic change with notable high-grade narrowing at the left P4 segment. Venous sinuses: Patent Anatomic variants: None significant. Small anterior communicating artery. No visible posterior communicating arteries. Delayed phase: No parenchymal enhancement. IMPRESSION:  1. No acute arterial finding. 2. Cervical carotid atherosclerosis without flow limiting stenosis. 3. 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 4. Intracranial atherosclerosis with notable high-grade right M1 -M2 segment stenosis. 5. 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 6. Slowly grown right thyroid nodule, now 28 mm. If appropriate for comorbidities, outpatient thyroid ultrasound could evaluate before biopsy. Electronically Signed   By: Monte Fantasia M.D.   On: 04/05/2015 17:25   Mr Brain Wo Contrast  04/05/2015  CLINICAL DATA:  Cerebrum stroke EXAM: MRI HEAD WITHOUT CONTRAST TECHNIQUE: Multiplanar, multiecho pulse sequences of the brain and surrounding structures were obtained without intravenous contrast. COMPARISON:  12/09/2014 FINDINGS: Calvarium and upper cervical spine: No focal marrow signal abnormality. Remote bilateral burr holes. Orbits: Negative. Sinuses and Mastoids: Clear. Brain: There is a sub cm area of diffusion hyperintensity parasagittal left frontal lobe on coronal reformats, but not convincing on axial acquisition. The restriction is somewhat weak for acute infarct. Chronic small vessel disease with confluent ischemic gliosis in the cerebral white matter. Multiple remote lacunar infarcts, including in the right centrum semiovale, left corona radiata, right posterior limb internal capsule, and bilateral brainstem. Normal cerebral volume. No evidence of hemorrhage, hydrocephalus, or major vessel occlusion. IMPRESSION: 1. 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 2. 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 Electronically Signed   By: Monte Fantasia M.D.   On: 04/05/2015 14:55   Dg Esophagus  04/05/2015  CLINICAL DATA:  Long history of dysphagia with history of esophageal dilation ; TIA like symptoms last night. EXAM: ESOPHOGRAM / BARIUM SWALLOW /  BARIUM TABLET STUDY TECHNIQUE: Combined double contrast and single contrast examination performed using effervescent crystals, thick barium liquid, and thin barium liquid. The patient was observed with fluoroscopy swallowing a 13 mm barium sulphate tablet. FLUOROSCOPY TIME:  Fluoroscopy Time:  1 minutes,  0 seconds Number of Acquired Images:  12 COMPARISON:  None in PACs FINDINGS: The patient ingested the thick and thin barium and gas forming crystals without difficulty. The cervical esophagus distended well. There was no laryngeal penetration of barium. There is no significant mass effect upon the posterior wall of the hypopharynx or proximal esophagus from previous fusion procedures in the cervical spine. The thoracic esophagus distended reasonably well but prominent tertiary contractions were observed subsequently. There was no significant reflux. No evidence of ulceration was demonstrated. The barium tablet passed promptly to the GE junction where it hung. It ultimately passed with an additional sip of barium and water. Survey views of the stomach and duodenum were normal. IMPRESSION: 1. No evidence of aspiration. Esophageal motility appears normal. Marked changes of presbyesophagus demonstrated. 2. Narrowing of the distal esophagus which initially will not allow passage of the barium tablet. It subsequently passed with additional barium and water. Electronically Signed   By: David  Martinique M.D.   On: 04/05/2015 09:24   ASSESSMENT AND PLAN:   Richard Chapman is a 75 y.o. male with a known history of type 2 diabetes on insulin pump, hypertension, essential tremor, GERD, hyperlipidemia comes to the emergency room accompanied by patient's wife. According to the wife patient was found on the recliner around 3 AM. He appeared confused had some slurred speech and did not know his whereabouts. He was surprised to find the EMTs in his house early hours of the morning. His sugars was 62 when checked by EMS.   1. Altered  mental status/transient confusion/slurred speech suspected due to hypoglycemia/ possibility of TIA as well. MRI shows possible left frontal acute subacute infarct however does not go with the symptoms patient had seen by Dr. Doy Mince neurology and recommends EEG and 24-hour observation -Neuro checks every 2 hourly-no neuro deficits noted -By mouth aspirin daily -Since patient appears neurologically intact I will hold off on further stroke workup at present  2. Difficulty swallowing with history of esophageal dilatation in the past Speech therapy to see patient -Barium swallow shows distal esophageal narrowing. Patient is able to tolerate dysphagia 3 diet. We'll set him up to see GI as outpatient for esophageal dilatation down the road. Patient wife agreeable  3. Hypertension continue home meds  4. Type 2 diabetes on insulin pump Resume insulin pump continue sliding scale insulin since sugars trending up  5. Hyperlipidemia Continue statins  6. 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 Generalized weakness difficulty ambulating Physical therapy recommends no PT needs at home.  8. DVT prophylaxis subcutaneous Lovenox  If patient remains neurologically stable will discharge in the morning. Patient and wife agreeable. Case discussed with Care Management/Social Worker. Management plans discussed with the patient, family and they are in agreement.  CODE STATUS: full   TOTAL TIME TAKING CARE OF THIS PATIENT: 35 minutes.  >50% time spent on counselling and coordination of care patient wife and neurology   POSSIBLE D/C IN ONE DAYS, DEPENDING ON CLINICAL CONDITION.  Note: This dictation was prepared with Dragon dictation along with smaller phrase technology. Any transcriptional errors that result from this process are unintentional.  Bayani Renteria M.D on 04/06/2015 at 12:49 PM  Between 7am to 6pm - Pager - [phone]  After 6pm go  to [email or web address] - password [email or web address] [facility]  [facility]  Office  [phone]  CC: Primary care physician; Adrian Prows, MD

## 2015-04-06 NOTE — Progress Notes (Signed)
EEG completed; results pending.    

## 2015-04-07 DIAGNOSIS — G459 Transient cerebral ischemic attack, unspecified: Secondary | ICD-10-CM | POA: Diagnosis not present

## 2015-04-07 LAB — GLUCOSE, CAPILLARY
GLUCOSE-CAPILLARY: 168 mg/dL — AB (ref 65–99)
Glucose-Capillary: 148 mg/dL — ABNORMAL HIGH (ref 65–99)
Glucose-Capillary: 155 mg/dL — ABNORMAL HIGH (ref 65–99)
Glucose-Capillary: 293 mg/dL — ABNORMAL HIGH (ref 65–99)

## 2015-04-07 MED ORDER — AMLODIPINE BESYLATE 10 MG PO TABS
10.0000 mg | ORAL_TABLET | Freq: Every day | ORAL | Status: DC
  Administered 2015-04-07: 08:00:00 10 mg via ORAL
  Filled 2015-04-07: qty 1

## 2015-04-07 MED ORDER — CLOPIDOGREL BISULFATE 75 MG PO TABS: 75.0000 mg | ORAL_TABLET | Freq: Every day | ORAL | Status: AC

## 2015-04-07 MED ORDER — AMLODIPINE BESYLATE 10 MG PO TABS: 10.0000 mg | ORAL_TABLET | Freq: Every day | ORAL | Status: AC

## 2015-04-07 NOTE — Discharge Instructions (Signed)
abstain from drinking alcohol

## 2015-04-07 NOTE — Progress Notes (Signed)
Subjective: Patient has had no further focal events.    Objective: Current vital signs: BP 170/70 mmHg  Pulse 51  Temp(Src) 97.8 F (36.6 C) (Oral)  Resp 18  Ht 5\' 9"  (1.753 m)  Wt 79.918 kg (176 lb 3 oz)  BMI 26.01 kg/m2  SpO2 96% Vital signs in last 24 hours: Temp:  [97.8 F (36.6 C)-98.1 F (36.7 C)] 97.8 F (36.6 C) (02/22 0015) Pulse Rate:  [51-64] 51 (02/22 0607) Resp:  [18-20] 18 (02/22 0509) BP: (140-194)/(59-79) 170/70 mmHg (02/22 0607) SpO2:  [94 %-98 %] 96 % (02/22 0509)  Intake/Output from previous day: 02/21 0701 - 02/22 0700 In: 960 [P.O.:960] Out: 1675 [Urine:1675] Intake/Output this shift: Total I/O In: 240 [P.O.:240] Out: 500 [Urine:500] Nutritional status: DIET DYS 3 Room service appropriate?: Yes; Fluid consistency:: Thin Diet - low sodium heart healthy  Neurologic Exam: Mental Status: Alert, oriented. Speech fluent but slow without evidence of aphasia. Able to follow 3 step commands without difficulty. Cranial Nerves: II: Discs flat bilaterally; Visual fields grossly normal, pupils equal, round, reactive to light and accommodation III,IV, VI: ptosis not present, extra-ocular motions intact bilaterally V,VII: smile symmetric, facial light touch sensation normal bilaterally VIII: hearing normal bilaterally IX,X: gag reflex present XI: bilateral shoulder shrug XII: midline tongue extension Motor: 5/5 throughout Sensory: Pinprick and light touch intact throughout, bilaterally  Lab Results: Basic Metabolic Panel:  Recent Labs Lab 04/05/15 0506  NA 139  K 3.5  CL 100*  CO2 31  GLUCOSE 62*  BUN 15  CREATININE 0.98  CALCIUM 9.1    Liver Function Tests:  Recent Labs Lab 04/05/15 0506  AST 29  ALT 30  ALKPHOS 60  BILITOT 0.8  PROT 7.1  ALBUMIN 3.9   No results for input(s): LIPASE, AMYLASE in the last 168 hours. No results for input(s): AMMONIA in the last 168 hours.  CBC:  Recent Labs Lab 04/05/15 0506  WBC 10.8*   NEUTROABS 6.6*  HGB 15.5  HCT 45.8  MCV 92.9  PLT 161    Cardiac Enzymes:  Recent Labs Lab 04/05/15 0506  TROPONINI 0.03    Lipid Panel:  Recent Labs Lab 04/06/15 0645  CHOL 140  TRIG 173*  HDL 37*  CHOLHDL 3.8  VLDL 35  LDLCALC 68    CBG:  Recent Labs Lab 04/06/15 1736 04/06/15 2148 04/07/15 0239 04/07/15 0506 04/07/15 0744  GLUCAP 362* 271* 168* 148* 155*    Microbiology: Results for orders placed or performed in visit on 10/23/14  Microscopic Examination     Status: None   Collection Time: 10/23/14 10:58 AM  Result Value Ref Range Status   WBC, UA 0-5 0 -  5 /hpf Final   RBC, UA 0-2 0 -  2 /hpf Final   Epithelial Cells (non renal) None seen 0 - 10 /hpf Final   Bacteria, UA None seen None seen/Few Final    Coagulation Studies:  Recent Labs  04/05/15 0506  LABPROT 12.3  INR 0.89    Imaging: Ct Angio Head W/cm &/or Wo Cm  04/05/2015  CLINICAL DATA:  TIA with transient slurred speech. EXAM: CT ANGIOGRAPHY HEAD AND NECK TECHNIQUE: Multidetector CT imaging of the head and neck was performed using the standard protocol during bolus administration of intravenous contrast. Multiplanar CT image reconstructions and MIPs were obtained to evaluate the vascular anatomy. Carotid stenosis measurements (when applicable) are obtained utilizing NASCET criteria, using the distal internal carotid diameter as the denominator. CONTRAST:  75mL OMNIPAQUE IOHEXOL 350  MG/ML SOLN COMPARISON:  None. FINDINGS: CT HEAD Brain: By CT there is no visible infarct. No acute hemorrhage. Chronic small-vessel disease with extensive gliosis throughout the cerebral white matter and remote lacunar infarcts in the bilateral brainstem, right thalamus/ internal capsule, and bilateral cerebral white matter. No hydrocephalus. Calvarium and skull base: Remote bilateral burr hole, 2 on each side. Paranasal sinuses: Clear Orbits: Cataract resections.  No acute finding CTA NECK Aortic arch: Minimally  imaged.  Three vessel branching Right carotid system: No notable atheromatous changes. No dissection or ulcerated plaque. No stenosis. Left carotid system: Bulky calcified plaque at the bifurcation with less than 50% stenosis. No dissection or ulcerated plaque. Vertebral arteries:Non dominant left vertebral artery with moderate to advanced origin stenosis. Both vertebral arteries are obscured twice in the V2 segment due to discectomy hardware. Skeleton: No contributory finding. Cervical spine degeneration with 3 level ACDF. Other neck: 28 mm right thyroid mass which is mildly grown since 2014 (23 mm). No invasive features. CTA HEAD Limited by venous contamination. Anterior circulation: Symmetric carotid arteries and branching. Carotid siphon atherosclerosis with notable plaque on the inferior wall of the bilateral mid cavernous segments without flow limiting stenosis. Tandem mild and then high-grade stenoses in the distal right M1 and M1 -M2 segments (inferior division branch) respectively. These are best seen mip reformats. Posterior circulation: No major branch occlusion or proximal flow limiting stenosis. Atherosclerotic change with notable high-grade narrowing at the left P4 segment. Venous sinuses: Patent Anatomic variants: None significant. Small anterior communicating artery. No visible posterior communicating arteries. Delayed phase: No parenchymal enhancement. IMPRESSION: 1. No acute arterial finding. 2. Cervical carotid atherosclerosis without flow limiting stenosis. 3. 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 4. Intracranial atherosclerosis with notable high-grade right M1 -M2 segment stenosis. 5. 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 6. Slowly grown right thyroid nodule, now 28 mm. If appropriate for comorbidities, outpatient thyroid ultrasound could evaluate before biopsy. Electronically Signed   By: Monte Fantasia M.D.   On: 04/05/2015 17:25    Ct Angio Neck W/cm &/or Wo/cm  04/05/2015  CLINICAL DATA:  TIA with transient slurred speech. EXAM: CT ANGIOGRAPHY HEAD AND NECK TECHNIQUE: Multidetector CT imaging of the head and neck was performed using the standard protocol during bolus administration of intravenous contrast. Multiplanar CT image reconstructions and MIPs were obtained to evaluate the vascular anatomy. Carotid stenosis measurements (when applicable) are obtained utilizing NASCET criteria, using the distal internal carotid diameter as the denominator. CONTRAST:  97mL OMNIPAQUE IOHEXOL 350 MG/ML SOLN COMPARISON:  None. FINDINGS: CT HEAD Brain: By CT there is no visible infarct. No acute hemorrhage. Chronic small-vessel disease with extensive gliosis throughout the cerebral white matter and remote lacunar infarcts in the bilateral brainstem, right thalamus/ internal capsule, and bilateral cerebral white matter. No hydrocephalus. Calvarium and skull base: Remote bilateral burr hole, 2 on each side. Paranasal sinuses: Clear Orbits: Cataract resections.  No acute finding CTA NECK Aortic arch: Minimally imaged.  Three vessel branching Right carotid system: No notable atheromatous changes. No dissection or ulcerated plaque. No stenosis. Left carotid system: Bulky calcified plaque at the bifurcation with less than 50% stenosis. No dissection or ulcerated plaque. Vertebral arteries:Non dominant left vertebral artery with moderate to advanced origin stenosis. Both vertebral arteries are obscured twice in the V2 segment due to discectomy hardware. Skeleton: No contributory finding. Cervical spine degeneration with 3 level ACDF. Other neck: 28 mm right thyroid mass which is mildly grown since 2014 (23  mm). No invasive features. CTA HEAD Limited by venous contamination. Anterior circulation: Symmetric carotid arteries and branching. Carotid siphon atherosclerosis with notable plaque on the inferior wall of the bilateral mid cavernous segments without flow  limiting stenosis. Tandem mild and then high-grade stenoses in the distal right M1 and M1 -M2 segments (inferior division branch) respectively. These are best seen mip reformats. Posterior circulation: No major branch occlusion or proximal flow limiting stenosis. Atherosclerotic change with notable high-grade narrowing at the left P4 segment. Venous sinuses: Patent Anatomic variants: None significant. Small anterior communicating artery. No visible posterior communicating arteries. Delayed phase: No parenchymal enhancement. IMPRESSION: 1. No acute arterial finding. 2. Cervical carotid atherosclerosis without flow limiting stenosis. 3. 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 4. Intracranial atherosclerosis with notable high-grade right M1 -M2 segment stenosis. 5. 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 6. Slowly grown right thyroid nodule, now 28 mm. If appropriate for comorbidities, outpatient thyroid ultrasound could evaluate before biopsy. Electronically Signed   By: Monte Fantasia M.D.   On: 04/05/2015 17:25   Mr Brain Wo Contrast  04/05/2015  CLINICAL DATA:  Cerebrum stroke EXAM: MRI HEAD WITHOUT CONTRAST TECHNIQUE: Multiplanar, multiecho pulse sequences of the brain and surrounding structures were obtained without intravenous contrast. COMPARISON:  12/09/2014 FINDINGS: Calvarium and upper cervical spine: No focal marrow signal abnormality. Remote bilateral burr holes. Orbits: Negative. Sinuses and Mastoids: Clear. Brain: There is a sub cm area of diffusion hyperintensity parasagittal left frontal lobe on coronal reformats, but not convincing on axial acquisition. The restriction is somewhat weak for acute infarct. Chronic small vessel disease with confluent ischemic gliosis in the cerebral white matter. Multiple remote lacunar infarcts, including in the right centrum semiovale, left corona radiata, right posterior limb internal capsule, and bilateral  brainstem. Normal cerebral volume. No evidence of hemorrhage, hydrocephalus, or major vessel occlusion. IMPRESSION: 1. 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 2. 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 Electronically Signed   By: Monte Fantasia M.D.   On: 04/05/2015 14:55    Medications:  I have reviewed the patient's current medications. Scheduled: . amLODipine  10 mg Oral Daily  . aspirin  81 mg Oral Daily  . atorvastatin  5 mg Oral Daily  . clopidogrel  75 mg Oral Daily  . enoxaparin (LOVENOX) injection  40 mg Subcutaneous Q24H  . escitalopram  20 mg Oral Daily  . folic acid  1 mg Oral Daily  . furosemide  40 mg Oral Daily  . gabapentin  100 mg Oral QHS  . hyoscyamine  0.375 mg Oral Q12H  . insulin aspart  0-12 Units Subcutaneous TID WC  . insulin aspart  4 Units Subcutaneous TID WC  . insulin glargine  30 Units Subcutaneous Daily  . isosorbide mononitrate  60 mg Oral Daily  . loratadine  10 mg Oral Daily  . LORazepam  0-4 mg Oral Q6H   Followed by  . LORazepam  0-4 mg Oral Q12H  . metFORMIN  1,000 mg Oral BID WC  . multivitamin with minerals  1 tablet Oral Daily  . pantoprazole  40 mg Oral Daily  . primidone  50 mg Oral BID  . propranolol  20 mg Oral BID  . thiamine  100 mg Oral Daily   Or  . thiamine  100 mg Intravenous Daily    Assessment/Plan: Patient with no further events.  On ASA and Plavix.  EEG  shows no epileptiform discharges.  MRI of the brain personally reviewed and shows no acute changes.  CTA of the head and neck showed no acute thrombus with some intracranial stenosis, most severe distally.  Echocardiogram shows EF of 60-65% and no cardiac source of emboli.   Since biggest concern is for TIA will continue on ASA and Plavix  Recommendations: 1.  Continue ASA and Plavix 2.  F/U with neurology as an outpatient.        LOS: 1 day   Alexis Goodell,  MD Neurology [phone] 04/07/2015  11:53 AM

## 2015-04-07 NOTE — Progress Notes (Signed)
Inpatient Diabetes Program Recommendations  AACE/ADA: New Consensus Statement on Inpatient Glycemic Control (2015)  Target Ranges:  Prepandial:   less than 140 mg/dL      Peak postprandial:   less than 180 mg/dL (1-2 hours)      Critically ill patients:  140 - 180 mg/dL    Spoke with patient today about d/c plans.  Reminded patient that he received Lantus insulin last night at bedtime and that he should not resume his VGO insulin pump until tonight at bedtime (9-10pm).  Patient has some syringes at home and can give himself an injection of Humalog for supper once he gets home.  Instructed patient to resume his VGO insulin pump between 9-10 pm tonight.  Reminded patient that each click on the VGO pump corresponds to 2 units of Humalog and that he should take the amount of Humalog that corresponds to each click (for example, if he needs 3 clicks, her should take 6 units Humalog injection).  Patient stated he understood instructions.  RN present for these instructions as well.    --Will follow patient during hospitalization--  Wyn Quaker RN, MSN, CDE Diabetes Coordinator Inpatient Glycemic Control Team Team Pager: [phone] (8a-5p)

## 2015-04-07 NOTE — Progress Notes (Signed)
Endocrinology follow up note:   Consulting Service: [facility] Endocrinology  Service Requesting Consult: Posey Pronto, Chauncey Cruel  SUBJECTIVE: Reason for Consultation: labile blood sugars  History of Present Illness: Richard Chapman is a 75 y.o. male with PMH Type 2 Diabetes, HTN, HPL, and multiple other medical comorbidities who was admitted from home with altered mental status. His wife found him confused and slurring his speech around 3 AM the morning of admission. She reports at that time his blood sugar was 82. By the time he was brought in by EMS, blood glucose was 62. The patient and wife both deny history of hypoglycemia. This is not a regular occurrence. The patient is followed by his endocrinologist Dr. Eddie Dibbles whom he saw last month. His diabetes is managed on a regimen of Metformin 1000 mg twice daily and Vgo 30 insulin pump. He receives 30 units of basal insulin daily. He normally boluses 4-8 units of Humalog at mealtimes if he is having a heavy enough meal.  24 hour events:  Vgo insulin pump was held. He was started on regimen of Lantus 30 units daily and novolog 4 units with meals plus correction scale. BG declined overnight and was 155 as of this am. Diet was advance as of today and BG was up in the 200's by noon. Patient was preparing for discharge. No complaints.    Current facility-administered medications:  .  acetaminophen (TYLENOL) tablet 650 mg, 650 mg, Oral, Q6H PRN **OR** acetaminophen (TYLENOL) suppository 650 mg, 650 mg, Rectal, Q6H PRN, Fritzi Mandes, MD .  acetaminophen (TYLENOL) tablet 650 mg, 650 mg, Oral, Q6H PRN, Fritzi Mandes, MD .  amLODipine (NORVASC) tablet 10 mg, 10 mg, Oral, Daily, Fritzi Mandes, MD, 10 mg at 04/07/15 9629 .  aspirin chewable tablet 81 mg, 81 mg, Oral, Daily, Fritzi Mandes, MD, 81 mg at 04/07/15 5284 .  atorvastatin (LIPITOR) tablet 5 mg, 5 mg, Oral, Daily, Fritzi Mandes, MD, 5 mg at 04/07/15 1324 .  clopidogrel (PLAVIX) tablet 75 mg, 75 mg, Oral, Daily,  Alexis Goodell, MD, 75 mg at 04/07/15 4010 .  enoxaparin (LOVENOX) injection 40 mg, 40 mg, Subcutaneous, Q24H, Fritzi Mandes, MD, 40 mg at 04/07/15 0823 .  escitalopram (LEXAPRO) tablet 20 mg, 20 mg, Oral, Daily, Fritzi Mandes, MD, 20 mg at 04/07/15 2725 .  folic acid (FOLVITE) tablet 1 mg, 1 mg, Oral, Daily, Sital Mody, MD, 1 mg at 04/07/15 0823 .  furosemide (LASIX) tablet 40 mg, 40 mg, Oral, Daily, Fritzi Mandes, MD, 40 mg at 04/07/15 3664 .  gabapentin (NEURONTIN) capsule 100 mg, 100 mg, Oral, QHS, Fritzi Mandes, MD, 100 mg at 04/06/15 2032 .  HYDROcodone-acetaminophen (NORCO/VICODIN) 5-325 MG per tablet 1-2 tablet, 1-2 tablet, Oral, Q4H PRN, Fritzi Mandes, MD, 2 tablet at 04/07/15 1246 .  hyoscyamine (LEVBID) 0.375 MG 12 hr tablet 0.375 mg, 0.375 mg, Oral, Q12H, Fritzi Mandes, MD, 0.375 mg at 04/07/15 4034 .  insulin aspart (novoLOG) injection 0-12 Units, 0-12 Units, Subcutaneous, TID WC, Abby Percell Locus, MD, 4 Units at 04/07/15 1246 .  insulin aspart (novoLOG) injection 4 Units, 4 Units, Subcutaneous, TID WC, Abby Percell Locus, MD, 4 Units at 04/07/15 1247 .  insulin glargine (LANTUS) injection 30 Units, 30 Units, Subcutaneous, Daily, Abby Percell Locus, MD, 30 Units at 04/06/15 2159 .  isosorbide mononitrate (IMDUR) 24 hr tablet 60 mg, 60 mg, Oral, Daily, Fritzi Mandes, MD, 60 mg at 04/07/15 7425 .  loratadine (CLARITIN) tablet 10 mg, 10 mg, Oral, Daily, Fritzi Mandes, MD, 10  mg at 04/07/15 1749 .  LORazepam (ATIVAN) tablet 1 mg, 1 mg, Oral, Q6H PRN **OR** LORazepam (ATIVAN) injection 1 mg, 1 mg, Intravenous, Q6H PRN, Bettey Costa, MD .  LORazepam (ATIVAN) tablet 0-4 mg, 0-4 mg, Oral, Q6H, 0 mg at 04/05/15 1835 **FOLLOWED BY** LORazepam (ATIVAN) tablet 0-4 mg, 0-4 mg, Oral, Q12H, Sital Mody, MD .  metFORMIN (GLUCOPHAGE) tablet 1,000 mg, 1,000 mg, Oral, BID WC, Abby Percell Locus, MD, 1,000 mg at 04/07/15 4496 .  multivitamin with minerals tablet 1 tablet, 1 tablet, Oral, Daily, Bettey Costa, MD, 1 tablet at  04/07/15 0823 .  nitroGLYCERIN (NITROSTAT) SL tablet 0.4 mg, 0.4 mg, Sublingual, Q5 min PRN, Fritzi Mandes, MD .  ondansetron (ZOFRAN) tablet 4 mg, 4 mg, Oral, Q6H PRN **OR** ondansetron (ZOFRAN) injection 4 mg, 4 mg, Intravenous, Q6H PRN, Fritzi Mandes, MD .  pantoprazole (PROTONIX) EC tablet 40 mg, 40 mg, Oral, Daily, Fritzi Mandes, MD, 40 mg at 04/07/15 7591 .  primidone (MYSOLINE) tablet 50 mg, 50 mg, Oral, BID, Fritzi Mandes, MD, 50 mg at 04/07/15 6384 .  propranolol (INDERAL) tablet 20 mg, 20 mg, Oral, BID, Fritzi Mandes, MD, 20 mg at 04/07/15 6659 .  senna-docusate (Senokot-S) tablet 1 tablet, 1 tablet, Oral, QHS PRN, Fritzi Mandes, MD, 1 tablet at 04/06/15 2032 .  thiamine (VITAMIN B-1) tablet 100 mg, 100 mg, Oral, Daily, 100 mg at 04/07/15 9357 **OR** thiamine (B-1) injection 100 mg, 100 mg, Intravenous, Daily, Bettey Costa, MD  Current outpatient prescriptions:  .  acetaminophen (TYLENOL) 325 MG tablet, Take 2 tablets (650 mg total) by mouth every 6 (six) hours as needed for mild pain (or Fever >/= 101)., Disp: , Rfl:  .  aspirin 81 MG tablet, Take 1 tablet (81 mg total) by mouth daily., Disp: 30 tablet, Rfl: 0 .  atorvastatin (LIPITOR) 10 MG tablet, Take 5 mg by mouth daily., Disp: , Rfl:  .  docusate sodium (COLACE) 100 MG capsule, Take 1 capsule (100 mg total) by mouth 2 (two) times daily as needed for mild constipation., Disp: 10 capsule, Rfl: 0 .  escitalopram (LEXAPRO) 20 MG tablet, Take 1 tablet (20 mg total) by mouth daily., Disp: 30 tablet, Rfl: 0 .  esomeprazole (NEXIUM) 20 MG capsule, Take 1 capsule (20 mg total) by mouth AC breakfast., Disp: 30 capsule, Rfl: 0 .  furosemide (LASIX) 40 MG tablet, Take 1 tablet (40 mg total) by mouth daily., Disp: 30 tablet, Rfl: 0 .  gabapentin (NEURONTIN) 100 MG capsule, Take 100 mg by mouth at bedtime. , Disp: , Rfl:  .  hyoscyamine (LEVBID) 0.375 MG 12 hr tablet, Take 0.375 mg by mouth every 12 (twelve) hours., Disp: , Rfl:  .  Insulin Disposable Pump (V-GO  30) KIT, , Disp: , Rfl:  .  insulin lispro (HUMALOG) 100 UNIT/ML injection, Inject 76 Units into the skin daily. Via VGo kit, Disp: , Rfl:  .  isosorbide mononitrate (IMDUR) 60 MG 24 hr tablet, Take 1 tablet (60 mg total) by mouth 2 (two) times daily. (Patient taking differently: Take 60 mg by mouth daily. ), Disp: 60 tablet, Rfl: 0 .  loratadine (CLARITIN) 10 MG tablet, Take 10 mg by mouth daily., Disp: , Rfl:  .  metFORMIN (GLUCOPHAGE) 1000 MG tablet, Take 1 tablet (1,000 mg total) by mouth daily with breakfast. (Patient taking differently: Take 1,000 mg by mouth 2 (two) times daily with a meal. ), Disp: 30 tablet, Rfl: 0 .  nitroGLYCERIN (NITROSTAT) 0.4 MG SL tablet, Place  0.4 mg under the tongue every 5 (five) minutes as needed for chest pain. , Disp: , Rfl:  .  primidone (MYSOLINE) 50 MG tablet, Take 1 tablet by mouth 2 (two) times daily., Disp: , Rfl:  .  propranolol (INDERAL) 20 MG tablet, Take 20 mg by mouth 2 (two) times daily. , Disp: , Rfl:  .  amLODipine (NORVASC) 10 MG tablet, Take 1 tablet (10 mg total) by mouth daily., Disp: 30 tablet, Rfl: 2 .  clopidogrel (PLAVIX) 75 MG tablet, Take 1 tablet (75 mg total) by mouth daily., Disp: 60 tablet, Rfl: 1 .  HYDROcodone-acetaminophen (NORCO/VICODIN) 5-325 MG tablet, Take 1 tablet by mouth every 6 (six) hours as needed for moderate pain., Disp: , Rfl:   Filed Vitals:   04/07/15 0607 04/07/15 1303  BP: 170/70 139/56  Pulse: 51 59  Temp:  97.8 F (36.6 C)  Resp:  18  Physical Exam: Gen: no acute distress, well-nourished, well-appearing elderly man HEENT: Viv[place]/AT, eyes anicteric, EOMI, mucous membranes moist, no oropharyngeal lesions CAD: regular rate, regular rhythm, s1, s2 PULM: clear to ausculation, no wheezes, rhonchi or rales. GI: soft, non tender, non distended. EXT: no clubbing, cyanosis or edema  Skin: warm, dry, no rash Neuro: grossly non focal, normal DTRs, alert and oriented x 3  BMP Latest Ref Rng 04/05/2015 11/25/2014  03/06/2014  Glucose 65 - 99 mg/dL 62(L) 213(H) 69  BUN 6 - 20 mg/dL 15 16 18   Creatinine 0.61 - 1.24 mg/dL 0.98 1.13 1.42(H)  Sodium 135 - 145 mmol/L 139 135 141  Potassium 3.5 - 5.1 mmol/L 3.5 3.3(L) 3.8  Chloride 101 - 111 mmol/L 100(L) 99(L) 104  CO2 22 - 32 mmol/L 31 26 29   Calcium 8.9 - 10.3 mg/dL 9.1 9.3 8.8   CBC Latest Ref Rng 04/05/2015 11/25/2014 03/06/2014  WBC 3.8 - 10.6 K/uL 10.8(H) 13.0(H) 8.9  Hemoglobin 13.0 - 18.0 g/dL 15.5 14.3 12.7(L)  Hematocrit 40.0 - 52.0 % 45.8 41.9 38.8(L)  Platelets 150 - 440 K/uL 161 158 152   Blood glucose values reviewed in glucose accordion view  ASSESSMENT:  1. Type 2 Diabetes, uncontrolled 2. Hypoglycemia - resolved   RECOMMENDATIONS:  Recommend transition back to his Vgo insulin pump regimen and metformin upon discharge. Instructed patient to wait until bedtime tonight (10 pm) to start his insulin pump since the last dose of Lantus was given around 10 pm last night. Warned him that overlap could result in hypoglycemia.  I have alerted his Endocrinologist Dr. Eddie Dibbles and she plans to see him in follow up within 2 weeks. Thank you for allowing me to participate in this patient's care.   Atha Starks, MD [facility] Endocr[facility]

## 2015-04-07 NOTE — Discharge Summary (Signed)
[facility] NAME: Richard Chapman    MR#:  [id number]  DATE OF BIRTH:  February 26, 1940  DATE OF ADMISSION:  04/05/2015 ADMITTING PHYSICIAN: Fritzi Mandes, MD  DATE OF DISCHARGE: *04/07/15  PRIMARY CARE PHYSICIAN: Adrian Prows, MD    ADMISSION DIAGNOSIS:  Other specified transient cerebral ischemias [G45.8] Slurred speech [R47.81] Swallowing difficulty [R13.10]  DISCHARGE DIAGNOSIS:  TIA Hypoglycemia resolved patient back on insulin pump at discharge Hypertension, accelerated History of alcohol use  SECONDARY DIAGNOSIS:   Past Medical History  Diagnosis Date  . History of nephrolithiasis   . GERD (gastroesophageal reflux disease)   . Anxiety and depression   . Skin cancer   . Depression   . Diabetes ([place])   . Hypertension   . Sleep apnea   . Elevated PSA     HOSPITAL COURSE:  Richard Chapman is a 75 y.o. male with a known history of type 2 diabetes on insulin pump, hypertension, essential tremor, GERD, hyperlipidemia comes to the emergency room accompanied by patient's wife. According to the wife patient was found on the recliner around 3 AM. He appeared confused had some slurred speech and did not know his whereabouts. He was surprised to find the EMTs in his house early hours of the morning. His sugars was 62 when checked by EMS.   1.Altered mental status/transient confusion/slurred speech suspected due to hypoglycemia/ possibility of TIA as well. MRI shows possible left frontal acute subacute infarct however does not go with the symptoms patient had seen by Dr. Doy Mince neurology -Negative for seizures EEG  -Neuro checks every 2 hourly-no neuro deficits noted -By mouth aspirin and Plavix for at least 3 months per neurology  2. Difficulty swallowing with history of esophageal dilatation in the past Speech therapy input noted-Barium swallow shows distal esophageal narrowing. Patient is able to tolerate dysphagia 3 diet.  We'll set him up to see GI as outpatient for esophageal dilatation down the road. Patient wife agreeable  3. Hypertension continue home meds  4. Type 2 diabetes on insulin pump Resume insulin pump continue sliding scale insulin since sugars trending up  5. Hyperlipidemia Continue statins  6. 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 Generalized weakness difficulty ambulating Physical therapy recommends no PT needs at home.  Overall stable. Patient was discharged to home okay from neuro standpoint to go home. CONSULTS OBTAINED:  Treatment Team:  Catarina Hartshorn, MD Abby Percell Locus, MD  DRUG ALLERGIES:  No Known Allergies  DISCHARGE MEDICATIONS:   Current Discharge Medication List    START taking these medications   Details  clopidogrel (PLAVIX) 75 MG tablet Take 1 tablet (75 mg total) by mouth daily. Qty: 60 tablet, Refills: 1      CONTINUE these medications which have CHANGED   Details  amLODipine (NORVASC) 10 MG tablet Take 1 tablet (10 mg total) by mouth daily. Qty: 30 tablet, Refills: 2      CONTINUE these medications which have NOT CHANGED   Details  acetaminophen (TYLENOL) 325 MG tablet Take 2 tablets (650 mg total) by mouth every 6 (six) hours as needed for mild pain (or Fever >/= 101).    aspirin 81 MG tablet Take 1 tablet (81 mg total) by mouth daily. Qty: 30 tablet, Refills: 0    atorvastatin (LIPITOR) 10 MG tablet Take 5 mg by mouth daily.    docusate sodium (COLACE) 100 MG capsule Take 1  capsule (100 mg total) by mouth 2 (two) times daily as needed for mild constipation. Qty: 10 capsule, Refills: 0    escitalopram (LEXAPRO) 20 MG tablet Take 1 tablet (20 mg total) by mouth daily. Qty: 30 tablet, Refills: 0    esomeprazole (NEXIUM) 20 MG capsule Take 1 capsule (20 mg total) by mouth AC breakfast. Qty: 30 capsule, Refills: 0    furosemide (LASIX) 40 MG tablet Take 1 tablet (40 mg total)  by mouth daily. Qty: 30 tablet, Refills: 0    gabapentin (NEURONTIN) 100 MG capsule Take 100 mg by mouth at bedtime.     hyoscyamine (LEVBID) 0.375 MG 12 hr tablet Take 0.375 mg by mouth every 12 (twelve) hours.    Insulin Disposable Pump (V-GO 30) KIT     insulin lispro (HUMALOG) 100 UNIT/ML injection Inject 76 Units into the skin daily. Via VGo kit    isosorbide mononitrate (IMDUR) 60 MG 24 hr tablet Take 1 tablet (60 mg total) by mouth 2 (two) times daily. Qty: 60 tablet, Refills: 0    loratadine (CLARITIN) 10 MG tablet Take 10 mg by mouth daily.    metFORMIN (GLUCOPHAGE) 1000 MG tablet Take 1 tablet (1,000 mg total) by mouth daily with breakfast. Qty: 30 tablet, Refills: 0    nitroGLYCERIN (NITROSTAT) 0.4 MG SL tablet Place 0.4 mg under the tongue every 5 (five) minutes as needed for chest pain.     primidone (MYSOLINE) 50 MG tablet Take 1 tablet by mouth 2 (two) times daily.    propranolol (INDERAL) 20 MG tablet Take 20 mg by mouth 2 (two) times daily.     HYDROcodone-acetaminophen (NORCO/VICODIN) 5-325 MG tablet Take 1 tablet by mouth every 6 (six) hours as needed for moderate pain.        If you experience worsening of your admission symptoms, develop shortness of breath, life threatening emergency, suicidal or homicidal thoughts you must seek medical attention immediately by calling 911 or calling your MD immediately  if symptoms less severe.  You Must read complete instructions/literature along with all the possible adverse reactions/side effects for all the Medicines you take and that have been prescribed to you. Take any new Medicines after you have completely understood and accept all the possible adverse reactions/side effects.   Please note  You were cared for by a hospitalist during your hospital stay. If you have any questions about your discharge medications or the care you received while you were in the hospital after you are discharged, you can call the unit and  asked to speak with the hospitalist on call if the hospitalist that took care of you is not available. Once you are discharged, your primary care physician will handle any further medical issues. Please note that NO REFILLS for any discharge medications will be authorized once you are discharged, as it is imperative that you return to your primary care physician (or establish a relationship with a primary care physician if you do not have one) for your aftercare needs so that they can reassess your need for medications and monitor your lab values. Today   SUBJECTIVE   No complaints wife in the room  VITAL SIGNS:  Blood pressure 170/70, pulse 51, temperature 97.8 F (36.6 C), temperature source Oral, resp. rate 18, height _0  (1.753 m), weight 79.918 kg (176 lb 3 oz), SpO2 96 %.  I/O:    Intake/Output Summary (Last 24 hours) at 04/07/15 0932 Last data filed at 04/07/15 0748  Gross per 24  hour  Intake    720 ml  Output   1575 ml  Net   -855 ml    PHYSICAL EXAMINATION:  GENERAL:  75 y.o.-year-old patient lying in the bed with no acute distress.  EYES: Pupils equal, round, reactive to light and accommodation. No scleral icterus. Extraocular muscles intact.  HEENT: Head atraumatic, normocephalic. Oropharynx and nasopharynx clear.  NECK:  Supple, no jugular venous distention. No thyroid enlargement, no tenderness.  LUNGS: Normal breath sounds bilaterally, no wheezing, rales,rhonchi or crepitation. No use of accessory muscles of respiration.  CARDIOVASCULAR: S1, S2 normal. No murmurs, rubs, or gallops.  ABDOMEN: Soft, non-tender, non-distended. Bowel sounds present. No organomegaly or mass.  EXTREMITIES: No pedal edema, cyanosis, or clubbing.  NEUROLOGIC: Cranial nerves II through XII are intact. Muscle strength 5/5 in all extremities. Sensation intact. Gait not checked.  PSYCHIATRIC: The patient is alert and oriented x 3.  SKIN: No obvious rash, lesion, or ulcer.   DATA REVIEW:   CBC    Recent Labs Lab 04/05/15 0506  WBC 10.8*  HGB 15.5  HCT 45.8  PLT 161    Chemistries   Recent Labs Lab 04/05/15 0506  NA 139  K 3.5  CL 100*  CO2 31  GLUCOSE 62*  BUN 15  CREATININE 0.98  CALCIUM 9.1  AST 29  ALT 30  ALKPHOS 60  BILITOT 0.8    Microbiology Results   No results found for this or any previous visit (from the past 240 hour(s)).  RADIOLOGY:  Ct Angio Head W/cm &/or Wo Cm  04/05/2015  CLINICAL DATA:  TIA with transient slurred speech. EXAM: CT ANGIOGRAPHY HEAD AND NECK TECHNIQUE: Multidetector CT imaging of the head and neck was performed using the standard protocol during bolus administration of intravenous contrast. Multiplanar CT image reconstructions and MIPs were obtained to evaluate the vascular anatomy. Carotid stenosis measurements (when applicable) are obtained utilizing NASCET criteria, using the distal internal carotid diameter as the denominator. CONTRAST:  47m OMNIPAQUE IOHEXOL 350 MG/ML SOLN COMPARISON:  None. FINDINGS: CT HEAD Brain: By CT there is no visible infarct. No acute hemorrhage. Chronic small-vessel disease with extensive gliosis throughout the cerebral white matter and remote lacunar infarcts in the bilateral brainstem, right thalamus/ internal capsule, and bilateral cerebral white matter. No hydrocephalus. Calvarium and skull base: Remote bilateral burr hole, 2 on each side. Paranasal sinuses: Clear Orbits: Cataract resections.  No acute finding CTA NECK Aortic arch: Minimally imaged.  Three vessel branching Right carotid system: No notable atheromatous changes. No dissection or ulcerated plaque. No stenosis. Left carotid system: Bulky calcified plaque at the bifurcation with less than 50% stenosis. No dissection or ulcerated plaque. Vertebral arteries:Non dominant left vertebral artery with moderate to advanced origin stenosis. Both vertebral arteries are obscured twice in the V2 segment due to discectomy hardware. Skeleton: No  contributory finding. Cervical spine degeneration with 3 level ACDF. Other neck: 28 mm right thyroid mass which is mildly grown since 2014 (23 mm). No invasive features. CTA HEAD Limited by venous contamination. Anterior circulation: Symmetric carotid arteries and branching. Carotid siphon atherosclerosis with notable plaque on the inferior wall of the bilateral mid cavernous segments without flow limiting stenosis. Tandem mild and then high-grade stenoses in the distal right M1 and M1 -M2 segments (inferior division branch) respectively. These are best seen mip reformats. Posterior circulation: No major branch occlusion or proximal flow limiting stenosis. Atherosclerotic change with notable high-grade narrowing at the left P4 segment. Venous sinuses: Patent Anatomic variants: None  significant. Small anterior communicating artery. No visible posterior communicating arteries. Delayed phase: No parenchymal enhancement. IMPRESSION: 1. No acute arterial finding. 2. Cervical carotid atherosclerosis without flow limiting stenosis. 3. 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 4. Intracranial atherosclerosis with notable high-grade right M1 -M2 segment stenosis. 5. 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 6. Slowly grown right thyroid nodule, now 28 mm. If appropriate for comorbidities, outpatient thyroid ultrasound could evaluate before biopsy. Electronically Signed   By: Monte Fantasia M.D.   On: 04/05/2015 17:25   Ct Angio Neck W/cm &/or Wo/cm  04/05/2015  CLINICAL DATA:  TIA with transient slurred speech. EXAM: CT ANGIOGRAPHY HEAD AND NECK TECHNIQUE: Multidetector CT imaging of the head and neck was performed using the standard protocol during bolus administration of intravenous contrast. Multiplanar CT image reconstructions and MIPs were obtained to evaluate the vascular anatomy. Carotid stenosis measurements (when applicable) are obtained utilizing NASCET  criteria, using the distal internal carotid diameter as the denominator. CONTRAST:  71m OMNIPAQUE IOHEXOL 350 MG/ML SOLN COMPARISON:  None. FINDINGS: CT HEAD Brain: By CT there is no visible infarct. No acute hemorrhage. Chronic small-vessel disease with extensive gliosis throughout the cerebral white matter and remote lacunar infarcts in the bilateral brainstem, right thalamus/ internal capsule, and bilateral cerebral white matter. No hydrocephalus. Calvarium and skull base: Remote bilateral burr hole, 2 on each side. Paranasal sinuses: Clear Orbits: Cataract resections.  No acute finding CTA NECK Aortic arch: Minimally imaged.  Three vessel branching Right carotid system: No notable atheromatous changes. No dissection or ulcerated plaque. No stenosis. Left carotid system: Bulky calcified plaque at the bifurcation with less than 50% stenosis. No dissection or ulcerated plaque. Vertebral arteries:Non dominant left vertebral artery with moderate to advanced origin stenosis. Both vertebral arteries are obscured twice in the V2 segment due to discectomy hardware. Skeleton: No contributory finding. Cervical spine degeneration with 3 level ACDF. Other neck: 28 mm right thyroid mass which is mildly grown since 2014 (23 mm). No invasive features. CTA HEAD Limited by venous contamination. Anterior circulation: Symmetric carotid arteries and branching. Carotid siphon atherosclerosis with notable plaque on the inferior wall of the bilateral mid cavernous segments without flow limiting stenosis. Tandem mild and then high-grade stenoses in the distal right M1 and M1 -M2 segments (inferior division branch) respectively. These are best seen mip reformats. Posterior circulation: No major branch occlusion or proximal flow limiting stenosis. Atherosclerotic change with notable high-grade narrowing at the left P4 segment. Venous sinuses: Patent Anatomic variants: None significant. Small anterior communicating artery. No visible  posterior communicating arteries. Delayed phase: No parenchymal enhancement. IMPRESSION: 1. No acute arterial finding. 2. Cervical carotid atherosclerosis without flow limiting stenosis. 3. 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 4. Intracranial atherosclerosis with notable high-grade right M1 -M2 segment stenosis. 5. 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 6. Slowly grown right thyroid nodule, now 28 mm. If appropriate for comorbidities, outpatient thyroid ultrasound could evaluate before biopsy. Electronically Signed   By: JMonte FantasiaM.D.   On: 04/05/2015 17:25   Mr Brain Wo Contrast  04/05/2015  CLINICAL DATA:  Cerebrum stroke EXAM: MRI HEAD WITHOUT CONTRAST TECHNIQUE: Multiplanar, multiecho pulse sequences of the brain and surrounding structures were obtained without intravenous contrast. COMPARISON:  12/09/2014 FINDINGS: Calvarium and upper cervical spine: No focal marrow signal abnormality. Remote bilateral burr holes. Orbits: Negative. Sinuses and Mastoids: Clear. Brain: There is a sub cm area of diffusion hyperintensity parasagittal left  frontal lobe on coronal reformats, but not convincing on axial acquisition. The restriction is somewhat weak for acute infarct. Chronic small vessel disease with confluent ischemic gliosis in the cerebral white matter. Multiple remote lacunar infarcts, including in the right centrum semiovale, left corona radiata, right posterior limb internal capsule, and bilateral brainstem. Normal cerebral volume. No evidence of hemorrhage, hydrocephalus, or major vessel occlusion. IMPRESSION: 1. 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 2. 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 Electronically Signed   By: Monte Fantasia M.D.   On: 04/05/2015 14:55     Management plans discussed with the patient, family and they are in  agreement.  CODE STATUS:     Code Status Orders        Start     Ordered   04/05/15 1037  Full code   Continuous     04/05/15 1036    Code Status History    Date Active Date Inactive Code Status Order ID Comments User Context   11/25/2014  7:23 AM 11/26/2014  9:01 PM Full Code [id number]  Hillary Bow, MD ED    Advance Directive Documentation        Most Recent Value   Type of Advance Directive  Living will   Pre-existing out of facility DNR order (yellow form or pink MOST form)     "MOST" Form in Place?        TOTAL TIME TAKING CARE OF THIS PATIENT: 40 minutes.    Brittley Regner M.D on 04/07/2015 at 9:32 AM  Between 7am to 6pm - Pager - [phone] After 6pm go to [email or web address] - password [email or web address] [facility]  [facility]  Office  [phone]  CC: Primary care physician; Adrian Prows, MD

## 2015-04-07 NOTE — Progress Notes (Signed)
Discharge instructions reviewed with patient. Patient signed paperwork. Patient belongings returned. Patient discharged via wheel chair with wife.

## 2015-04-07 NOTE — Progress Notes (Signed)
Speech Language Pathology Treatment: Dysphagia  Patient Details Name: Richard Chapman MRN: [id number] DOB: 1940-12-25 Today's Date: 04/07/2015 Time: 8676-1950 SLP Time Calculation (min) (ACUTE ONLY): 60 min  Assessment / Plan / Recommendation Clinical Impression  Pt appeared to adequately tolerate trials of thin liquids, and his breakfast meal, w/ no overt s/s of aspiration noted by staff and wife; oral phase appears wfl. Pt is beginning to use the rec'd swallowing strategies to aid Esophageal motility and general aspiration precautions. Discussed handouts Reflux precautions, Esophageal motility, and aspiration precautions and modeled rec'd eating/drinking behaviors; pt gave verbal agreement. Wife agreed. Discussed foods and options/prep to aid Esophageal motility; handouts. Pt appears at his baseline w/ reduced risk for aspiration from an oropharyngeal phase standpoint; min. Increased risk for aspiration from an Esophageal/Reflux standpoint. Rec. F/u w/ GI for continued management and assessment of Esophageal motility.    HPI HPI: Pt is a 75 y.o. male with a known history of type 2 diabetes on insulin pump, hypertension, essential tremor, ETOH abuse, GERD, hyperlipidemia comes to the emergency room accompanied by patient's wife. According to the wife patient was found on the recliner around 3 AM. He appeared confused had some slurred speech and did not know his whereabouts. He was surprised to find the EMTs in his house early hours of the morning. His sugars was 62 when checked by EMS. Was brought to the emergency room at present is hemodynamically stable alert oriented 3. Denies any focal weakness or any slurred speech or any blurred vision. CT of head showed no new infarcts. Wife reports patient having difficulty swallowing on and off with coughing intermittently with meals. He has had history of esophageal dilatation in the remote past. Patient denies any chest pain or shortness of breath. He is being  admitted with hypoglycemia likely causing symptoms versus TIA. Pt had a brief change of status yesterday PM but it resolved at that time and wife stated he is back to his baseline. Of note, a Barium Swallow study(GI) was peformed post admission per SLP recommendation to MD based on pt's complaints. Barium study revealed No evidence of aspiration. Marked changes of presbyesophagus demonstrated. Narrowing of the distal esophagus which initially will not allow passage of the barium tablet. It subsequently passed with additional w/ additional barium and water. This could be directly related to pt's c/o cough and difficulty swallowing w/ meals per his and wife's report. In addition, pt tends to eat quickly consuming a full meal in few minutes per wife.       SLP Plan  All goals met     Recommendations  Diet recommendations: Dysphagia 3 (mechanical soft);Thin liquid (moistened) Liquids provided via: Straw;Cup Medication Administration: Whole meds with puree (education on this) Supervision: Patient able to self feed Compensations: Minimize environmental distractions;Slow rate;Small sips/bites;Follow solids with liquid (rest breaks during meals to allow for Esophageal clearing) Postural Changes and/or Swallow Maneuvers: Seated upright 90 degrees;Upright 30-60 min after meal             Oral Care Recommendations: Oral care BID;Patient independent with oral care Follow up Recommendations: None Plan: All goals met     GO               Orinda Kenner, MS, CCC-SLP  Shermar Friedland 04/07/2015, 10:15 AM

## 2015-04-07 NOTE — Procedures (Signed)
ELECTROENCEPHALOGRAM REPORT   Patient: Richard Chapman       Room #: 118A-AA EEG No. ID: [id number] Age: 75 y.o.        Sex: male Referring Physician: patel Report Date:  04/07/2015        Interpreting Physician: Alexis Goodell  History: Richard Chapman is an 75 y.o. male with episodes of left sided weakness  Medications:  Scheduled: . amLODipine  10 mg Oral Daily  . aspirin  81 mg Oral Daily  . atorvastatin  5 mg Oral Daily  . clopidogrel  75 mg Oral Daily  . enoxaparin (LOVENOX) injection  40 mg Subcutaneous Q24H  . escitalopram  20 mg Oral Daily  . folic acid  1 mg Oral Daily  . furosemide  40 mg Oral Daily  . gabapentin  100 mg Oral QHS  . hyoscyamine  0.375 mg Oral Q12H  . insulin aspart  0-12 Units Subcutaneous TID WC  . insulin aspart  4 Units Subcutaneous TID WC  . insulin glargine  30 Units Subcutaneous Daily  . isosorbide mononitrate  60 mg Oral Daily  . loratadine  10 mg Oral Daily  . LORazepam  0-4 mg Oral Q6H   Followed by  . LORazepam  0-4 mg Oral Q12H  . metFORMIN  1,000 mg Oral BID WC  . multivitamin with minerals  1 tablet Oral Daily  . pantoprazole  40 mg Oral Daily  . primidone  50 mg Oral BID  . propranolol  20 mg Oral BID  . thiamine  100 mg Oral Daily   Or  . thiamine  100 mg Intravenous Daily    Conditions of Recording:  This is a 16 channel EEG carried out with the patient in the awake state.  Description:  The waking background activity consists of a low voltage, symmetrical, fairly well organized, 8-9 Hz alpha activity, seen from the parieto-occipital and posterior temporal regions.  Low voltage fast activity, poorly organized, is seen anteriorly and is at times superimposed on more posterior regions.  A mixture of theta and alpha rhythms are seen from the central and temporal regions. The patient does not drowse or sleep. No epileptiform activity is noted.   Hyperventilation and intermittent photic stimulation were not  performed.  IMPRESSION: This is a normal awake electroencephalogram. There are no focal lateralizing or epileptiform features.  Comment:  An EEG with the patient sleep deprived to elicit drowse and light sleep may be desirable to further elicit a possible seizure disorder.     Alexis Goodell, MD Neurology [phone] 04/07/2015, 8:18 AM

## 2015-04-26 ENCOUNTER — Ambulatory Visit
Admission: RE | Admit: 2015-04-26 | Discharge: 2015-04-26 | Disposition: A | Discharge status: Home / Self Care | Payer: Medicare Other | Source: Ambulatory Visit | Attending: Internal Medicine | Admitting: Internal Medicine

## 2015-04-26 DIAGNOSIS — E041 Nontoxic single thyroid nodule: Secondary | ICD-10-CM | POA: Diagnosis not present

## 2015-04-26 DIAGNOSIS — E079 Disorder of thyroid, unspecified: Secondary | ICD-10-CM

## 2015-04-28 DIAGNOSIS — G451 Carotid artery syndrome (hemispheric): Secondary | ICD-10-CM | POA: Insufficient documentation

## 2015-05-05 DIAGNOSIS — D72829 Elevated white blood cell count, unspecified: Secondary | ICD-10-CM | POA: Insufficient documentation

## 2015-05-10 ENCOUNTER — Other Ambulatory Visit: Payer: Self-pay | Admitting: Internal Medicine

## 2015-05-10 DIAGNOSIS — E041 Nontoxic single thyroid nodule: Secondary | ICD-10-CM

## 2015-05-14 ENCOUNTER — Ambulatory Visit
Admission: RE | Admit: 2015-05-14 | Discharge: 2015-05-14 | Disposition: A | Discharge status: Home / Self Care | Payer: Medicare Other | Source: Ambulatory Visit | Attending: Internal Medicine | Admitting: Internal Medicine

## 2015-05-14 DIAGNOSIS — E041 Nontoxic single thyroid nodule: Secondary | ICD-10-CM

## 2015-05-26 ENCOUNTER — Ambulatory Visit
Admission: RE | Admit: 2015-05-26 | Discharge: 2015-05-26 | Disposition: A | Discharge status: Home / Self Care | Payer: Medicare Other | Source: Ambulatory Visit | Attending: Internal Medicine | Admitting: Internal Medicine

## 2015-05-26 DIAGNOSIS — E041 Nontoxic single thyroid nodule: Secondary | ICD-10-CM | POA: Diagnosis not present

## 2015-05-26 DIAGNOSIS — E079 Disorder of thyroid, unspecified: Secondary | ICD-10-CM | POA: Diagnosis present

## 2015-05-26 LAB — CBC
HEMATOCRIT: 37.3 % — AB (ref 40.0–52.0)
Hemoglobin: 12.8 g/dL — ABNORMAL LOW (ref 13.0–18.0)
MCH: 31.1 pg (ref 26.0–34.0)
MCHC: 34.3 g/dL (ref 32.0–36.0)
MCV: 90.6 fL (ref 80.0–100.0)
Platelets: 163 10*3/uL (ref 150–440)
RBC: 4.12 MIL/uL — ABNORMAL LOW (ref 4.40–5.90)
RDW: 15.9 % — AB (ref 11.5–14.5)
WBC: 8.5 10*3/uL (ref 3.8–10.6)

## 2015-05-26 LAB — PROTIME-INR
INR: 0.92
Prothrombin Time: 12.6 seconds (ref 11.4–15.0)

## 2015-05-26 LAB — APTT: APTT: 26 s (ref 24–36)

## 2015-05-26 NOTE — Procedures (Signed)
US right thyroid biopsy  Complications:  None  Blood Loss: none  See dictation in canopy pacs  

## 2015-06-02 ENCOUNTER — Other Ambulatory Visit: Payer: Medicare Other

## 2015-06-02 DIAGNOSIS — R972 Elevated prostate specific antigen [PSA]: Secondary | ICD-10-CM

## 2015-06-03 LAB — PSA: Prostate Specific Ag, Serum: 7.9 ng/mL — ABNORMAL HIGH (ref 0.0–4.0)

## 2015-06-04 ENCOUNTER — Ambulatory Visit: Payer: Medicare Other | Admitting: Urology

## 2015-06-16 ENCOUNTER — Ambulatory Visit (INDEPENDENT_AMBULATORY_CARE_PROVIDER_SITE_OTHER): Payer: Medicare Other | Admitting: Urology

## 2015-06-16 ENCOUNTER — Encounter: Payer: Self-pay | Admitting: Urology

## 2015-06-16 VITALS — BP 164/67 | HR 73 | Ht 68.0 in | Wt 190.9 lb

## 2015-06-16 DIAGNOSIS — N3281 Overactive bladder: Secondary | ICD-10-CM

## 2015-06-16 DIAGNOSIS — C61 Malignant neoplasm of prostate: Secondary | ICD-10-CM

## 2015-06-16 MED ORDER — MIRABEGRON ER 50 MG PO TB24: 50.0000 mg | ORAL_TABLET | Freq: Every day | ORAL | Status: DC

## 2015-06-16 NOTE — Progress Notes (Signed)
3:16 PM  06/16/2015   Richard Chapman [birth year] [id number]  Referring provider: Leonel Ramsay, MD [place], [place] [address]  Chief Complaint  Patient presents with  . Follow-up    elevated PSA    HPI: 75 year old male with elevated PSA to 9.1 dx 09/2014 with Gleason 3+3 in 2 cores involving the right apex, up to 23% of tissue.  Rectal exam was benign. TRUS volume 60 cc.   He is on active surveillance and returns today for rectal exam and PSA.  He does have urinary frequency, urgency, and nocturia which is fairly significant.. He now wear depends for both urge incontinence as well as fecal incontinence.  He has tried Mybetriq 25 mg as well as Vesicare 5 mg and had no benefit from either of these medications.  More recently, he was given Myrbetriq 50 mg x 2 weeks (samples) which did help his symptoms but he never called to have a prescriptions filled.  He does admit to continued drinking of coffee, tea, sodas and beer on a regular basis (has not cut back).  He also has severe baseline ED.   PSA trend below:  Component     Latest Ref Rng 01/29/2015 06/02/2015  PSA     0.0 - 4.0 ng/mL 11.4 (H) 7.9 (H)     PMH: Past Medical History  Diagnosis Date  . History of nephrolithiasis   . GERD (gastroesophageal reflux disease)   . Anxiety and depression   . Skin cancer   . Depression   . Diabetes ([place])   . Hypertension   . Sleep apnea   . Elevated PSA     Surgical History: Past Surgical History  Procedure Laterality Date  . Tonsillectomy    . Knee surgery Bilateral   . Shoulder surgery    . Back surgery    . Neck surgery    . Appendectomy    . Wrist surgery      Home Medications:    Medication List       This list is accurate as of: 06/16/15  3:16 PM.  Always use your most recent med list.               amLODipine 10 MG tablet  Commonly known as:  NORVASC  Take 1 tablet (10 mg total) by mouth daily.     aspirin 81 MG tablet  Take  1 tablet (81 mg total) by mouth daily.     atorvastatin 10 MG tablet  Commonly known as:  LIPITOR  Take 5 mg by mouth daily.     clopidogrel 75 MG tablet  Commonly known as:  PLAVIX  Take 1 tablet (75 mg total) by mouth daily.     docusate sodium 100 MG capsule  Commonly known as:  COLACE  Take 1 capsule (100 mg total) by mouth 2 (two) times daily as needed for mild constipation.     escitalopram 20 MG tablet  Commonly known as:  LEXAPRO  Take 1 tablet (20 mg total) by mouth daily.     esomeprazole 20 MG capsule  Commonly known as:  NEXIUM  Take 1 capsule (20 mg total) by mouth AC breakfast.     furosemide 40 MG tablet  Commonly known as:  LASIX  Take 1 tablet (40 mg total) by mouth daily.     gabapentin 300 MG capsule  Commonly known as:  NEURONTIN     HUMALOG 100 UNIT/ML injection  Generic drug:  insulin lispro  Inject 76 Units into the skin daily. Via VGo kit     hyoscyamine 0.375 MG 12 hr tablet  Commonly known as:  LEVBID  Take 0.375 mg by mouth every 12 (twelve) hours.     isosorbide mononitrate 60 MG 24 hr tablet  Commonly known as:  IMDUR  Take 1 tablet (60 mg total) by mouth 2 (two) times daily.     loratadine 10 MG tablet  Commonly known as:  CLARITIN  Take 10 mg by mouth daily.     metFORMIN 1000 MG tablet  Commonly known as:  GLUCOPHAGE  Take 1 tablet (1,000 mg total) by mouth daily with breakfast.     mirabegron ER 50 MG Tb24 tablet  Commonly known as:  MYRBETRIQ  Take 1 tablet (50 mg total) by mouth daily.     nitroGLYCERIN 0.4 MG SL tablet  Commonly known as:  NITROSTAT  Place 0.4 mg under the tongue every 5 (five) minutes as needed for chest pain.     primidone 50 MG tablet  Commonly known as:  MYSOLINE  Take 1 tablet by mouth 2 (two) times daily.     propranolol 20 MG tablet  Commonly known as:  INDERAL  Take 20 mg by mouth 2 (two) times daily.     tamsulosin 0.4 MG Caps capsule  Commonly known as:  FLOMAX     V-GO 30 Kit         Allergies: No Known Allergies  Family History: Family History  Problem Relation Age of Onset  . Heart disease Mother   . Heart disease Father   . Diabetes Father   . Stroke Father   . Kidney disease Neg Hx   . Prostate cancer Neg Hx     Social History:  reports that he quit smoking about 12 months ago. He does not have any smokeless tobacco history on file. He reports that he drinks about 1.2 oz of alcohol per week. He reports that he does not use illicit drugs.  ROS: UROLOGY Frequent Urination?: Yes Hard to postpone urination?: Yes Burning/pain with urination?: No Get up at night to urinate?: Yes Leakage of urine?: Yes Urine stream starts and stops?: No Trouble starting stream?: No Do you have to strain to urinate?: No Blood in urine?: No Urinary tract infection?: No Sexually transmitted disease?: No Injury to kidneys or bladder?: No Painful intercourse?: No Weak stream?: No Erection problems?: Yes Penile pain?: No  Gastrointestinal Nausea?: No Vomiting?: No Indigestion/heartburn?: No Diarrhea?: No Constipation?: No  Constitutional Fever: No Night sweats?: No Weight loss?: No Fatigue?: No  Skin Skin rash/lesions?: No Itching?: Yes  Eyes Blurred vision?: No Double vision?: No  Ears/Nose/Throat Sore throat?: No Sinus problems?: Yes  Hematologic/Lymphatic Swollen glands?: No Easy bruising?: Yes  Cardiovascular Leg swelling?: No Chest pain?: No  Respiratory Cough?: No Shortness of breath?: No  Endocrine Excessive thirst?: No  Musculoskeletal Back pain?: Yes Joint pain?: Yes  Neurological Headaches?: No Dizziness?: No  Psychologic Depression?: Yes Anxiety?: No  Physical Exam: BP 164/67 mmHg  Pulse 73  Ht 5' 8" (1.727 m)  Wt 190 lb 14.4 oz (86.592 kg)  BMI 29.03 kg/m2  Constitutional:  Alert and oriented, No acute distress.  Somewhat frail-appearing, ambulating with cane. HEENT: [place] AT, moist mucus membranes.  Trachea  midline, no masses. Cardiovascular: No clubbing, cyanosis, or edema. Respiratory: Normal respiratory effort, no increased work of breathing. GI: Abdomen is soft, nontender, nondistended, no abdominal masses GU: No CVA tenderness.  Rectal exam: Normal sphincter tone. Enlarged 50+ cc prostate, rubbery, nontender, no masses. Neurologic: Grossly intact, no focal deficits, moving all 4 extremities. Psychiatric: Normal mood and affect.  Laboratory Data: Lab Results  Component Value Date   WBC 8.5 05/26/2015   HGB 12.8* 05/26/2015   HCT 37.3* 05/26/2015   MCV 90.6 05/26/2015   PLT 163 05/26/2015    Lab Results  Component Value Date   CREATININE 0.98 04/05/2015   PSA trend as above  Pertinent Imaging: n/a  Assessment & Plan:    1. Prostate cancer ([place]) On active surveillance. Rectal exam unremarkable today, PSA stable (slightly decreased) Recommend continuation of every 4 monthly PSA/tDRE and consideration of biopsy at 1 year. - PSA - Urinalysis, Complete  2. Urge incontinence Significant baseline urinary urgency and urge incontinence.  Has not emplamented any behavioral modification as discussed, this was reviewed today at length  Some improvement with Mybetriq 50 mg daily, [facility]  Return in about 4 months (around 10/17/2015) for PSA/ DRE.  Hollice Espy, MD  [facility] [address], [place], [place] [address] [phone]

## 2015-06-21 LAB — CYTOLOGY - NON PAP

## 2015-06-22 ENCOUNTER — Encounter: Payer: Self-pay | Admitting: Diagnostic Radiology

## 2015-09-15 ENCOUNTER — Other Ambulatory Visit: Payer: Self-pay | Admitting: Internal Medicine

## 2015-09-15 DIAGNOSIS — E079 Disorder of thyroid, unspecified: Secondary | ICD-10-CM

## 2015-09-23 ENCOUNTER — Observation Stay
Admission: EM | Admit: 2015-09-23 | Discharge: 2015-09-24 | Disposition: A | Discharge status: Home / Self Care | Payer: Medicare Other | Attending: Internal Medicine | Admitting: Internal Medicine

## 2015-09-23 ENCOUNTER — Emergency Department: Payer: Medicare Other

## 2015-09-23 ENCOUNTER — Encounter: Payer: Self-pay | Admitting: Emergency Medicine

## 2015-09-23 DIAGNOSIS — K2900 Acute gastritis without bleeding: Secondary | ICD-10-CM | POA: Insufficient documentation

## 2015-09-23 DIAGNOSIS — K219 Gastro-esophageal reflux disease without esophagitis: Principal | ICD-10-CM | POA: Insufficient documentation

## 2015-09-23 DIAGNOSIS — I2510 Atherosclerotic heart disease of native coronary artery without angina pectoris: Secondary | ICD-10-CM | POA: Insufficient documentation

## 2015-09-23 DIAGNOSIS — Z7982 Long term (current) use of aspirin: Secondary | ICD-10-CM | POA: Diagnosis not present

## 2015-09-23 DIAGNOSIS — R079 Chest pain, unspecified: Secondary | ICD-10-CM

## 2015-09-23 DIAGNOSIS — R0789 Other chest pain: Secondary | ICD-10-CM | POA: Diagnosis present

## 2015-09-23 DIAGNOSIS — Z7902 Long term (current) use of antithrombotics/antiplatelets: Secondary | ICD-10-CM | POA: Insufficient documentation

## 2015-09-23 LAB — COMPREHENSIVE METABOLIC PANEL
ALK PHOS: 73 U/L (ref 38–126)
ALT: 25 U/L (ref 17–63)
AST: 23 U/L (ref 15–41)
Albumin: 3.8 g/dL (ref 3.5–5.0)
Anion gap: 15 (ref 5–15)
BUN: 16 mg/dL (ref 6–20)
CALCIUM: 8.9 mg/dL (ref 8.9–10.3)
CO2: 19 mmol/L — AB (ref 22–32)
CREATININE: 1.03 mg/dL (ref 0.61–1.24)
Chloride: 97 mmol/L — ABNORMAL LOW (ref 101–111)
GFR calc non Af Amer: >60 mL/min (ref >60)
Glucose, Bld: 145 mg/dL — ABNORMAL HIGH (ref 65–99)
Potassium: 3.4 mmol/L — ABNORMAL LOW (ref 3.5–5.1)
SODIUM: 131 mmol/L — AB (ref 135–145)
Total Bilirubin: 0.7 mg/dL (ref 0.3–1.2)
Total Protein: 6.9 g/dL (ref 6.5–8.1)

## 2015-09-23 LAB — TROPONIN I

## 2015-09-23 MED ORDER — GI COCKTAIL ~~LOC~~
30.0000 mL | Freq: Once | ORAL | Status: AC
  Administered 2015-09-23: 30 mL via ORAL
  Filled 2015-09-23: qty 30

## 2015-09-23 MED ORDER — MORPHINE SULFATE (PF) 4 MG/ML IV SOLN
4.0000 mg | Freq: Once | INTRAVENOUS | Status: AC
  Administered 2015-09-23: 4 mg via INTRAVENOUS
  Filled 2015-09-23: qty 1

## 2015-09-23 NOTE — ED Provider Notes (Signed)
[facility] Emergency Department Provider Note        Time seen: ----------------------------------------- 10:53 PM on 09/23/2015 -----------------------------------------    I have reviewed the triage vital signs and the nursing notes.   HISTORY  Chief Complaint Chest Pain    HPI Richard Chapman is a 75 y.o. male who presents the ER for chest pain. Patient describes it as dull, nothing makes it better or worse. In the past she's been seen for this has been diagnosed with esophageal spasm. Patient states he takes Prilosec, was told the past that he had chest pain to come to the ER for evaluation. He has seen his cardiologist for this and again it was not thought to be cardiac in origin. He's had some diarrhea today but otherwise denies complaints. Pain currently 9/10   Past Medical History:  Diagnosis Date  . Anxiety and depression   . Depression   . Diabetes ([place])   . Elevated PSA   . GERD (gastroesophageal reflux disease)   . History of nephrolithiasis   . Hypertension   . Skin cancer   . Sleep apnea     Patient Active Problem List   Diagnosis Date Noted  . Elevated WBC count 05/05/2015  . 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 CVA (cerebral infarction) 04/06/2015  . Cerebral infarction ([place]) 04/06/2015  . Malignant neoplasm of prostate ([place]) 01/29/2015  . Cervical disc disease 12/03/2014  . Chest pain 11/25/2014  . Arthritis 08/20/2014  . Benign fibroma of prostate 08/20/2014  . Clinical depression 08/20/2014  . Failure of erection 08/20/2014  . Enlarged prostate 08/20/2014  . Major depressive disorder with single episode ([place]) 08/20/2014  . 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  . Benign essential HTN 08/04/2014  . Acute low back pain 04/23/2014  . Alteration in bowel elimination: incontinence 04/23/2014  . Urge incontinence 04/23/2014  . 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 Benign essential tremor  12/23/2013  . B12 deficiency 08/13/2013  . Chronic kidney disease (CKD), stage III (moderate) 08/13/2013  . Diabetes ([place]) 08/13/2013  . Hypoglycemia 08/13/2013  . Diabetes mellitus ([place]) 08/13/2013  . Type 2 diabetes mellitus ([place]) 08/13/2013  . Arteriosclerosis of coronary artery 07/24/2013  . Fatigue 07/24/2013  . Acid reflux 07/24/2013  . Combined fat and carbohydrate induced hyperlipemia 07/24/2013  . CAD in native artery 07/24/2013  . Gastro-esophageal reflux disease without esophagitis 07/24/2013  . 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 Subdural hematoma ([place]) 03/26/2013  . Traumatic subdural hemorrhage ([place]) 03/26/2013  . Abnormal prostate specific antigen 09/01/2012  . Benign prostatic hyperplasia with urinary obstruction 09/01/2012  . Elevated prostate specific antigen (PSA) 09/01/2012    Past Surgical History:  Procedure Laterality Date  . APPENDECTOMY    . BACK SURGERY    . KNEE SURGERY Bilateral   . NECK SURGERY    . SHOULDER SURGERY    . TONSILLECTOMY    . WRIST SURGERY      Allergies Review of patient's allergies indicates no known allergies.  Social History Social History  Substance Use Topics  . Smoking status: Former Smoker    Quit date: 05/21/2014  . Smokeless tobacco: Never Used  . Alcohol use 1.2 oz/week    2 Standard drinks or equivalent per week    Review of Systems Constitutional: Negative for fever. Cardiovascular: Positive for chest pain Respiratory: Negative for shortness of breath. Gastrointestinal: Negative for abdominal pain, vomiting. Positive for diarrhea Genitourinary: Negative for dysuria. Musculoskeletal: Negative for back  pain. Skin: Negative for rash. Neurological: Negative for headaches, focal weakness or numbness.  10-point ROS otherwise negative.  ____________________________________________   PHYSICAL EXAM:  VITAL SIGNS: ED Triage Vitals  Enc Vitals Group     BP 09/23/15 2243 (!) 175/70     Pulse  Rate 09/23/15 2243 83     Resp 09/23/15 2243 19     Temp 09/23/15 2243 97.6 F (36.4 C)     Temp Source 09/23/15 2243 Oral     SpO2 09/23/15 2240 97 %     Weight 09/23/15 2244 185 lb (83.9 kg)     Height 09/23/15 2244 5\' 9"  (1.753 m)     Head Circumference --      Peak Flow --      Pain Score 09/23/15 2244 8     Pain Loc --      Pain Edu? --      Excl. in [place]? --     Constitutional: Alert and oriented. Well appearing and in no distress. Eyes: Conjunctivae are normal. PERRL. Normal extraocular movements. ENT   Head: Normocephalic and atraumatic.   Nose: No congestion/rhinnorhea.   Mouth/Throat: Mucous membranes are moist.   Neck: No stridor. Cardiovascular: Normal rate, regular rhythm. No murmurs, rubs, or gallops. Respiratory: Normal respiratory effort without tachypnea nor retractions. Breath sounds are clear and equal bilaterally. No wheezes/rales/rhonchi. Gastrointestinal: Soft and nontender. Normal bowel sounds Musculoskeletal: Nontender with normal range of motion in all extremities. No lower extremity tenderness, mild pitting edema of the lower extremities Neurologic:  Normal speech and language. No gross focal neurologic deficits are appreciated.  Skin:  Skin is warm, dry and intact. No rash noted. Psychiatric: Mood and affect are normal. Speech and behavior are normal.  ____________________________________________  EKG: Interpreted by me.Sinus rhythm rate 84 bpm, first degree AV block, LVH with repolarization abnormality, normal QT interval. No evidence of acute infarction  ____________________________________________  ED COURSE:  Pertinent labs & imaging results that were available during my care of the patient were reviewed by me and considered in my medical decision making (see chart for details). Clinical Course  Comment By Time  Patient reports pain is still 7/10 Earleen Newport, MD 08/10 2348  Patient presents the ER in no acute distress. We will  assess with basic labs, EKG and imaging.  Procedures ____________________________________________   LABS (pertinent positives/negatives)  Labs Reviewed  COMPREHENSIVE METABOLIC PANEL - Abnormal; Notable for the following:       Result Value   Sodium 131 (*)    Potassium 3.4 (*)    Chloride 97 (*)    CO2 19 (*)    Glucose, Bld 145 (*)    All other components within normal limits  TROPONIN I  CBC WITH DIFFERENTIAL/PLATELET  TROPONIN I    RADIOLOGY  Chest x-ray Is unremarkable ____________________________________________  FINAL ASSESSMENT AND PLAN  Chest pain  Plan: Patient with labs and imaging as dictated above. Patient with intractable chest pain of uncertain etiology. In the past she states this is related to esophageal spasm. He's required 3 sublingual nitroglycerin, he's had adult aspirin, 2 doses of morphine without significant improvement in his pain. Initial EKG and troponin are unremarkable. I will recommend observation with cardiology consultation.   Earleen Newport, MD   Note: This dictation was prepared with Dragon dictation. Any transcriptional errors that result from this process are unintentional    Earleen Newport, MD 09/24/15 [phone]

## 2015-09-23 NOTE — ED Triage Notes (Signed)
Per EMS patient was eating a burger and potato chips and choked up on them.  Patient says he did not aspirate the food but immediately after that he felt chest p[ain and called EMS.  Pt is AOx4 and NAD.  Vitals are WNL

## 2015-09-24 DIAGNOSIS — R0789 Other chest pain: Secondary | ICD-10-CM | POA: Diagnosis present

## 2015-09-24 LAB — TROPONIN I: Troponin I: <0.03 ng/mL (ref <0.03)

## 2015-09-24 LAB — CBC WITH DIFFERENTIAL/PLATELET
Basophils Absolute: 0.1 10*3/uL (ref 0–0.1)
Basophils Relative: 0 %
Eosinophils Absolute: 0.1 10*3/uL (ref 0–0.7)
Eosinophils Relative: 1 %
HCT: 40.4 % (ref 40.0–52.0)
Hemoglobin: 14.1 g/dL (ref 13.0–18.0)
Lymphocytes Relative: 30 %
Lymphs Abs: 3.9 10*3/uL — ABNORMAL HIGH (ref 1.0–3.6)
MCH: 30.5 pg (ref 26.0–34.0)
MCHC: 34.9 g/dL (ref 32.0–36.0)
MCV: 87.5 fL (ref 80.0–100.0)
Monocytes Absolute: 1.2 10*3/uL — ABNORMAL HIGH (ref 0.2–1.0)
Monocytes Relative: 10 %
Neutro Abs: 7.7 10*3/uL — ABNORMAL HIGH (ref 1.4–6.5)
Neutrophils Relative %: 59 %
Platelets: 190 10*3/uL (ref 150–440)
RBC: 4.62 MIL/uL (ref 4.40–5.90)
RDW: 14.9 % — ABNORMAL HIGH (ref 11.5–14.5)
WBC: 13 10*3/uL — ABNORMAL HIGH (ref 3.8–10.6)

## 2015-09-24 LAB — HEMOGLOBIN A1C: Hgb A1c MFr Bld: 7.4 % — ABNORMAL HIGH (ref 4.0–6.0)

## 2015-09-24 LAB — TSH: TSH: 1.978 u[IU]/mL (ref 0.350–4.500)

## 2015-09-24 LAB — GLUCOSE, CAPILLARY
Glucose-Capillary: 63 mg/dL — ABNORMAL LOW (ref 65–99)
Glucose-Capillary: 143 mg/dL — ABNORMAL HIGH (ref 65–99)

## 2015-09-24 MED ORDER — PANTOPRAZOLE SODIUM 40 MG PO TBEC
40.0000 mg | DELAYED_RELEASE_TABLET | Freq: Once | ORAL | Status: AC
  Administered 2015-09-24: 40 mg via ORAL

## 2015-09-24 MED ORDER — POTASSIUM CHLORIDE CRYS ER 20 MEQ PO TBCR
40.0000 meq | EXTENDED_RELEASE_TABLET | Freq: Once | ORAL | Status: AC
  Administered 2015-09-24: 40 meq via ORAL
  Filled 2015-09-24: qty 2

## 2015-09-24 MED ORDER — ATORVASTATIN CALCIUM 10 MG PO TABS
5.0000 mg | ORAL_TABLET | Freq: Every day | ORAL | Status: DC
  Administered 2015-09-24: 5 mg via ORAL
  Filled 2015-09-24: qty 1

## 2015-09-24 MED ORDER — PREDNISONE 20 MG PO TABS
20.0000 mg | ORAL_TABLET | Freq: Every day | ORAL | Status: DC
  Administered 2015-09-24: 20 mg via ORAL
  Filled 2015-09-24: qty 1

## 2015-09-24 MED ORDER — ENOXAPARIN SODIUM 40 MG/0.4ML ~~LOC~~ SOLN: 40.0000 mg | SUBCUTANEOUS | Status: DC

## 2015-09-24 MED ORDER — ACETAMINOPHEN 650 MG RE SUPP: 650.0000 mg | Freq: Four times a day (QID) | RECTAL | Status: DC | PRN

## 2015-09-24 MED ORDER — INSULIN PUMP
Freq: Three times a day (TID) | SUBCUTANEOUS | Status: DC
  Filled 2015-09-24: qty 1

## 2015-09-24 MED ORDER — AZITHROMYCIN 250 MG PO TABS
250.0000 mg | ORAL_TABLET | Freq: Every day | ORAL | Status: DC
  Administered 2015-09-24: 250 mg via ORAL
  Filled 2015-09-24: qty 1

## 2015-09-24 MED ORDER — ESCITALOPRAM OXALATE 10 MG PO TABS
20.0000 mg | ORAL_TABLET | Freq: Every day | ORAL | Status: DC
  Administered 2015-09-24: 20 mg via ORAL
  Filled 2015-09-24: qty 2

## 2015-09-24 MED ORDER — HYOSCYAMINE SULFATE ER 0.375 MG PO TB12
0.3750 mg | ORAL_TABLET | Freq: Two times a day (BID) | ORAL | Status: DC
  Administered 2015-09-24: 0.375 mg via ORAL
  Filled 2015-09-24 (×2): qty 1

## 2015-09-24 MED ORDER — ACETAMINOPHEN 325 MG PO TABS: 650.0000 mg | ORAL_TABLET | Freq: Four times a day (QID) | ORAL | Status: DC | PRN

## 2015-09-24 MED ORDER — ESOMEPRAZOLE MAGNESIUM 20 MG PO CPDR: 20.0000 mg | DELAYED_RELEASE_CAPSULE | Freq: Every morning | ORAL | 1 refills | Status: AC

## 2015-09-24 MED ORDER — MORPHINE SULFATE (PF) 4 MG/ML IV SOLN
4.0000 mg | Freq: Once | INTRAVENOUS | Status: AC
  Administered 2015-09-24: 4 mg via INTRAVENOUS
  Filled 2015-09-24: qty 1

## 2015-09-24 MED ORDER — NITROGLYCERIN 0.4 MG SL SUBL: 0.4000 mg | SUBLINGUAL_TABLET | SUBLINGUAL | Status: DC | PRN

## 2015-09-24 MED ORDER — PRIMIDONE 50 MG PO TABS
50.0000 mg | ORAL_TABLET | Freq: Two times a day (BID) | ORAL | Status: DC
  Administered 2015-09-24: 50 mg via ORAL
  Filled 2015-09-24: qty 1

## 2015-09-24 MED ORDER — FUROSEMIDE 40 MG PO TABS
40.0000 mg | ORAL_TABLET | Freq: Every day | ORAL | Status: DC
  Administered 2015-09-24: 40 mg via ORAL
  Filled 2015-09-24: qty 1

## 2015-09-24 MED ORDER — PANTOPRAZOLE SODIUM 40 MG PO TBEC
40.0000 mg | DELAYED_RELEASE_TABLET | Freq: Every day | ORAL | Status: DC
  Filled 2015-09-24: qty 1

## 2015-09-24 MED ORDER — HYDROMORPHONE HCL 1 MG/ML IJ SOLN
1.0000 mg | Freq: Once | INTRAMUSCULAR | Status: AC
  Administered 2015-09-24: 1 mg via INTRAVENOUS

## 2015-09-24 MED ORDER — GI COCKTAIL ~~LOC~~
30.0000 mL | Freq: Once | ORAL | Status: AC
  Administered 2015-09-24: 30 mL via ORAL
  Filled 2015-09-24: qty 30

## 2015-09-24 MED ORDER — RANOLAZINE ER 500 MG PO TB12
500.0000 mg | ORAL_TABLET | Freq: Two times a day (BID) | ORAL | Status: DC
  Administered 2015-09-24: 500 mg via ORAL
  Filled 2015-09-24: qty 1

## 2015-09-24 MED ORDER — TAMSULOSIN HCL 0.4 MG PO CAPS
0.4000 mg | ORAL_CAPSULE | Freq: Every day | ORAL | Status: DC
  Administered 2015-09-24: 0.4 mg via ORAL
  Filled 2015-09-24: qty 1

## 2015-09-24 MED ORDER — ASPIRIN 81 MG PO CHEW
81.0000 mg | CHEWABLE_TABLET | Freq: Every day | ORAL | Status: DC
Start: 2015-09-24 — End: 2015-09-24
  Administered 2015-09-24: 81 mg via ORAL
  Filled 2015-09-24: qty 1

## 2015-09-24 MED ORDER — GABAPENTIN 300 MG PO CAPS: 300.0000 mg | ORAL_CAPSULE | Freq: Every day | ORAL | Status: DC

## 2015-09-24 MED ORDER — NITROGLYCERIN 2 % TD OINT
TOPICAL_OINTMENT | TRANSDERMAL | Status: AC
  Administered 2015-09-24: 0.5 [in_us] via TOPICAL
  Filled 2015-09-24: qty 1

## 2015-09-24 MED ORDER — CLOPIDOGREL BISULFATE 75 MG PO TABS
75.0000 mg | ORAL_TABLET | Freq: Every day | ORAL | Status: DC
  Administered 2015-09-24: 75 mg via ORAL
  Filled 2015-09-24: qty 1

## 2015-09-24 MED ORDER — MIRABEGRON ER 25 MG PO TB24
50.0000 mg | ORAL_TABLET | Freq: Every day | ORAL | Status: DC
  Administered 2015-09-24: 50 mg via ORAL
  Filled 2015-09-24: qty 2

## 2015-09-24 MED ORDER — AMLODIPINE BESYLATE 10 MG PO TABS
10.0000 mg | ORAL_TABLET | Freq: Every day | ORAL | Status: DC
  Administered 2015-09-24: 10 mg via ORAL
  Filled 2015-09-24: qty 1

## 2015-09-24 MED ORDER — DOCUSATE SODIUM 100 MG PO CAPS: 100.0000 mg | ORAL_CAPSULE | Freq: Two times a day (BID) | ORAL | Status: DC | PRN

## 2015-09-24 MED ORDER — ASPIRIN 81 MG PO CHEW
324.0000 mg | CHEWABLE_TABLET | Freq: Once | ORAL | Status: AC
  Administered 2015-09-24: 324 mg via ORAL
  Filled 2015-09-24: qty 4

## 2015-09-24 MED ORDER — PROPRANOLOL HCL 40 MG PO TABS
20.0000 mg | ORAL_TABLET | Freq: Two times a day (BID) | ORAL | Status: DC
  Administered 2015-09-24: 20 mg via ORAL
  Filled 2015-09-24: qty 1

## 2015-09-24 MED ORDER — MORPHINE SULFATE (PF) 4 MG/ML IV SOLN: 4.0000 mg | Freq: Once | INTRAVENOUS | Status: DC

## 2015-09-24 MED ORDER — NITROGLYCERIN 2 % TD OINT
0.5000 [in_us] | TOPICAL_OINTMENT | Freq: Once | TRANSDERMAL | Status: AC
  Administered 2015-09-24: 0.5 [in_us] via TOPICAL

## 2015-09-24 MED ORDER — SODIUM CHLORIDE 0.9% FLUSH: 3.0000 mL | Freq: Two times a day (BID) | INTRAVENOUS | Status: DC

## 2015-09-24 MED ORDER — LORATADINE 10 MG PO TABS
10.0000 mg | ORAL_TABLET | Freq: Every day | ORAL | Status: DC
  Administered 2015-09-24: 10 mg via ORAL
  Filled 2015-09-24: qty 1

## 2015-09-24 NOTE — Progress Notes (Signed)
Pt still with complaints of pain in the chest. Spoke with Dr. Marcille Blanco states to give his morning dose of protonix early.

## 2015-09-24 NOTE — H&P (Signed)
Richard Chapman is an 75 y.o. male.   Chief Complaint: Chest pain HPI: The patient with past medical history of hypertension and diabetes presents to the emergency department complaining of chest pain. The patient states that began after he ate. He recalls choking on a bit of food but easily coughed it up. He sat down and the pain began up sternally and did not radiate. He states the pain is unrelenting but denies shortness of breath, nausea, vomiting or diaphoresis. Initial troponin was negative and the patient had no EKG changes. However, the patient received multiple doses of pain medication without relief which prompted the emergency department staff to call the hospitalist service for admission.  Past Medical History:  Diagnosis Date  . Anxiety and depression   . Depression   . Diabetes ([place])   . Elevated PSA   . GERD (gastroesophageal reflux disease)   . History of nephrolithiasis   . Hypertension   . Skin cancer   . Sleep apnea     Past Surgical History:  Procedure Laterality Date  . APPENDECTOMY    . BACK SURGERY    . KNEE SURGERY Bilateral   . NECK SURGERY    . SHOULDER SURGERY    . TONSILLECTOMY    . WRIST SURGERY      Family History  Problem Relation Age of Onset  . Heart disease Mother   . Heart disease Father   . Diabetes Father   . Stroke Father   . Kidney disease Neg Hx   . Prostate cancer Neg Hx    Social History:  reports that he quit smoking about 16 months ago. He has never used smokeless tobacco. He reports that he drinks about 1.2 oz of alcohol per week . He reports that he does not use drugs.  Allergies: No Known Allergies  Medications Prior to Admission  Medication Sig Dispense Refill  . amLODipine (NORVASC) 10 MG tablet Take 1 tablet (10 mg total) by mouth daily. 30 tablet 2  . aspirin 81 MG tablet Take 1 tablet (81 mg total) by mouth daily. 30 tablet 0  . atorvastatin (LIPITOR) 10 MG tablet Take 5 mg by mouth daily.    Marland Kitchen azithromycin (ZITHROMAX)  250 MG tablet Take 250 mg by mouth daily. 500 mg on 09/23/15 and 250 mg daily until finished    . clopidogrel (PLAVIX) 75 MG tablet Take 1 tablet (75 mg total) by mouth daily. 60 tablet 1  . docusate sodium (COLACE) 100 MG capsule Take 1 capsule (100 mg total) by mouth 2 (two) times daily as needed for mild constipation. 10 capsule 0  . escitalopram (LEXAPRO) 20 MG tablet Take 1 tablet (20 mg total) by mouth daily. 30 tablet 0  . esomeprazole (NEXIUM) 20 MG capsule Take 1 capsule (20 mg total) by mouth AC breakfast. 30 capsule 0  . furosemide (LASIX) 40 MG tablet Take 1 tablet (40 mg total) by mouth daily. 30 tablet 0  . gabapentin (NEURONTIN) 300 MG capsule Take 300 mg by mouth at bedtime.     . hyoscyamine (LEVBID) 0.375 MG 12 hr tablet Take 0.375 mg by mouth every 12 (twelve) hours.    . insulin lispro (HUMALOG) 100 UNIT/ML injection Inject 0-80 Units into the skin daily. Via VGo kit    . isosorbide mononitrate (IMDUR) 60 MG 24 hr tablet Take 1 tablet (60 mg total) by mouth 2 (two) times daily. (Patient taking differently: Take 60 mg by mouth daily. ) 60 tablet 0  .  loratadine (CLARITIN) 10 MG tablet Take 10 mg by mouth daily.    . metFORMIN (GLUCOPHAGE) 1000 MG tablet Take 1 tablet (1,000 mg total) by mouth daily with breakfast. (Patient taking differently: Take 1,000 mg by mouth 2 (two) times daily with a meal. ) 30 tablet 0  . mirabegron ER (MYRBETRIQ) 50 MG TB24 tablet Take 1 tablet (50 mg total) by mouth daily. 90 tablet 3  . nitroGLYCERIN (NITROSTAT) 0.4 MG SL tablet Place 0.4 mg under the tongue every 5 (five) minutes as needed for chest pain.     . predniSONE (DELTASONE) 20 MG tablet Take 20 mg by mouth daily.    . primidone (MYSOLINE) 50 MG tablet Take 1 tablet by mouth 2 (two) times daily.    . propranolol (INDERAL) 20 MG tablet Take 20 mg by mouth 2 (two) times daily.     . tamsulosin (FLOMAX) 0.4 MG CAPS capsule Take 0.4 mg by mouth daily.       Results for orders placed or  performed during the hospital encounter of 09/23/15 (from the past 48 hour(s))  Troponin I     Status: None   Collection Time: 09/23/15 10:53 PM  Result Value Ref Range   Troponin I <0.03 <0.03 ng/mL  Comprehensive metabolic panel     Status: Abnormal   Collection Time: 09/23/15 10:53 PM  Result Value Ref Range   Sodium 131 (L) 135 - 145 mmol/L   Potassium 3.4 (L) 3.5 - 5.1 mmol/L   Chloride 97 (L) 101 - 111 mmol/L   CO2 19 (L) 22 - 32 mmol/L   Glucose, Bld 145 (H) 65 - 99 mg/dL   BUN 16 6 - 20 mg/dL   Creatinine, Ser 1.03 0.61 - 1.24 mg/dL   Calcium 8.9 8.9 - 10.3 mg/dL   Total Protein 6.9 6.5 - 8.1 g/dL   Albumin 3.8 3.5 - 5.0 g/dL   AST 23 15 - 41 U/L   ALT 25 17 - 63 U/L   Alkaline Phosphatase 73 38 - 126 U/L   Total Bilirubin 0.7 0.3 - 1.2 mg/dL   GFR calc non Af Amer >60 >60 mL/min   GFR calc Af Amer >60 >60 mL/min    Comment: (NOTE) The eGFR has been calculated using the CKD EPI equation. This calculation has not been validated in all clinical situations. eGFR's persistently <60 mL/min signify possible Chronic Kidney Disease.    Anion gap 15 5 - 15   Dg Chest 2 View  Result Date: 09/23/2015 CLINICAL DATA:  Per EMS patient was eating a burger and potato chips and choked up on them tonight. Patient says he did not aspirate the food but immediately after that he felt chest pain and called EMS. hx of GERD, HTN EXAM: CHEST  2 VIEW COMPARISON:  11/25/2014 FINDINGS: The heart size and mediastinal contours are within normal limits. Both lungs are clear. Moderate mid thoracic spondylosis. Previous cervical fusion. IMPRESSION: No evidence for acute  abnormality. Electronically Signed   By: Nolon Nations M.D.   On: 09/23/2015 23:38    Review of Systems  Constitutional: Negative for chills and fever.  HENT: Negative for sore throat and tinnitus.   Eyes: Negative for blurred vision and redness.  Respiratory: Negative for cough and shortness of breath.   Cardiovascular: Positive  for chest pain. Negative for palpitations, orthopnea and PND.  Gastrointestinal: Negative for abdominal pain, diarrhea, nausea and vomiting.  Genitourinary: Negative for dysuria, frequency and urgency.  Musculoskeletal: Negative for joint  pain and myalgias.  Skin: Negative for rash.       No lesions  Neurological: Negative for speech change, focal weakness and weakness.  Endo/Heme/Allergies: Does not bruise/bleed easily.       No temperature intolerance  Psychiatric/Behavioral: Negative for depression and suicidal ideas.    Blood pressure (!) 167/70, pulse 65, temperature 98.7 F (37.1 C), resp. rate 20, height 5' 9"  (1.753 m), weight 86.5 kg (190 lb 9.6 oz), SpO2 98 %. Physical Exam  Constitutional: He is oriented to person, place, and time. He appears well-developed and well-nourished. No distress.  HENT:  Head: Normocephalic and atraumatic.  Mouth/Throat: Oropharynx is clear and moist.  Eyes: Conjunctivae and EOM are normal. Pupils are equal, round, and reactive to light. No scleral icterus.  Neck: Normal range of motion. Neck supple. No JVD present. No tracheal deviation present. No thyromegaly present.  Cardiovascular: Normal rate, regular rhythm and normal heart sounds.  Exam reveals no gallop and no friction rub.   No murmur heard. Respiratory: Effort normal and breath sounds normal. No respiratory distress.  GI: Soft. Bowel sounds are normal. He exhibits no distension. There is no tenderness.  Genitourinary:  Genitourinary Comments: Deferred  Musculoskeletal: Normal range of motion. He exhibits no edema.  Lymphadenopathy:    He has no cervical adenopathy.  Neurological: He is alert and oriented to person, place, and time. No cranial nerve deficit.  Skin: Skin is warm and dry. No rash noted. No erythema.  Psychiatric: He has a normal mood and affect. His behavior is normal. Judgment and thought content normal.     Assessment/Plan This is a 75 year old male admitted for  atypical chest pain. 1. Chest pain: Atypical. The pain has lasted for possibly 3 hours at the time of my exam and has remained 8-10 out of 10 in severity. He has had a workup for similar pain in the past. Likely related to esophageal spasm. Nonetheless we will cycle cardiac biomarkers and monitor telemetry. I have changed the patient's Imdur to Ranexa. Observe for improvement in pain. 2. CAD: Appears to be stable. Continue Plavix and aspirin. 3. Diabetes mellitus type 2: Hold metformin. Continue insulin per insulin pump. 4. Hyperlipidemia: Continue statin therapy  5. Hypertension: Stable; continue amlodipine 6. Congestive heart failure: Stable; continue Lasix per home regimen 7. Tremor: Continue primidone and propranolol 8. Prostatic hypertrophy: With urinary hesitancy; continue Flomax and Myrbretriq 9. Depression: Stable; continue Lexapro 10. DVT prophylaxis: Lovenox 11. GI prophylaxis: Pantoprazole per home regimen The patient is a full code. Time spent on admission orders and patient care approximately 45 minutes   Harrie Foreman, MD 09/24/2015, 6:57 AM

## 2015-09-24 NOTE — Care Management Note (Signed)
Case Management Note  Patient Details  Name: Richard Chapman MRN: [id number] Date of Birth: [birth year]  Subjective/Objective:    Spoke with patient for discharge planning. Patient is alert and oriented from home with spouse who is also in the room. He stated that she does most of the driving. He has a walker and wheelchair but stated that he ambulates with a cane most of the time. No CM needs identified. Action/Plan:Anticpated discharge plan is home with self care.   Expected Discharge Date:  09/25/15               Expected Discharge Plan:  Home/Self Care  In-House Referral:     Discharge planning Services  CM Consult  Post Acute Care Choice:    Choice offered to:     DME Arranged:    DME Agency:     HH Arranged:    HH Agency:     Status of Service:  In process, will continue to follow  If discussed at Long Length of Stay Meetings, dates discussed:    Additional Comments:  Alvie Heidelberg, RN 09/24/2015, 11:42 AM

## 2015-09-24 NOTE — Discharge Summary (Signed)
[facility] [place] NAME: Richard Chapman    MR#:  [id number]  DATE OF BIRTH:  04/08/1940  DATE OF ADMISSION:  09/23/2015 ADMITTING PHYSICIAN: Harrie Foreman, MD  DATE OF DISCHARGE: 09/24/15  PRIMARY CARE PHYSICIAN: Leonel Ramsay, MD    ADMISSION DIAGNOSIS:  Nonspecific chest pain [R07.9]  DISCHARGE DIAGNOSIS:  GERD/Acute gastritis   SECONDARY DIAGNOSIS:   Past Medical History:  Diagnosis Date  . Anxiety and depression   . Depression   . Diabetes ([place])   . Elevated PSA   . GERD (gastroesophageal reflux disease)   . History of nephrolithiasis   . Hypertension   . Skin cancer   . Sleep apnea     HOSPITAL COURSE:   75 year old male admitted for atypical chest pain. 1. Chest pain: Atypical.  -pt had choked on hamburger and started having burning sensation in his chest/throat. Some relief with GI cocktail Increased nexium to bid CE neg x2 EKG no acute changes 2. CAD: Appears to be stable. Continue Plavix and aspirin. 3. Diabetes mellitus type 2: resum  metformin. Continue insulin per insulin pump. 4. Hyperlipidemia: Continue statin therapy  5. Hypertension: Stable; continue amlodipine 6. Congestive heart failure: Stable; continue Lasix per home regimen 7. Tremor: Continue primidone and propranolol 8. Prostatic hypertrophy: With urinary hesitancy; continue Flomax and Myrbretriq 9. Depression: Stable; continue Lexapro 10. DVT prophylaxis: Lovenox 11. GI prophylaxis: Pantoprazole per home regimen  Overall stable. He has been eating well and vitals stable D/c home Pt agreeable CONSULTS OBTAINED:    DRUG ALLERGIES:  No Known Allergies  DISCHARGE MEDICATIONS:   Current Discharge Medication List    CONTINUE these medications which have CHANGED   Details  esomeprazole (NEXIUM) 20 MG capsule Take 1 capsule (20 mg total) by mouth AC breakfast. Qty: 60 capsule, Refills: 1      CONTINUE these medications  which have NOT CHANGED   Details  amLODipine (NORVASC) 10 MG tablet Take 1 tablet (10 mg total) by mouth daily. Qty: 30 tablet, Refills: 2    aspirin 81 MG tablet Take 1 tablet (81 mg total) by mouth daily. Qty: 30 tablet, Refills: 0    atorvastatin (LIPITOR) 10 MG tablet Take 5 mg by mouth daily.    azithromycin (ZITHROMAX) 250 MG tablet Take 250 mg by mouth daily. 500 mg on 09/23/15 and 250 mg daily until finished    clopidogrel (PLAVIX) 75 MG tablet Take 1 tablet (75 mg total) by mouth daily. Qty: 60 tablet, Refills: 1    docusate sodium (COLACE) 100 MG capsule Take 1 capsule (100 mg total) by mouth 2 (two) times daily as needed for mild constipation. Qty: 10 capsule, Refills: 0    escitalopram (LEXAPRO) 20 MG tablet Take 1 tablet (20 mg total) by mouth daily. Qty: 30 tablet, Refills: 0    furosemide (LASIX) 40 MG tablet Take 1 tablet (40 mg total) by mouth daily. Qty: 30 tablet, Refills: 0    gabapentin (NEURONTIN) 300 MG capsule Take 300 mg by mouth at bedtime.     hyoscyamine (LEVBID) 0.375 MG 12 hr tablet Take 0.375 mg by mouth every 12 (twelve) hours.    insulin lispro (HUMALOG) 100 UNIT/ML injection Inject 0-80 Units into the skin daily. Via VGo kit    isosorbide mononitrate (IMDUR) 60 MG 24 hr tablet Take 1 tablet (60 mg total) by mouth 2 (two) times daily. Qty: 60 tablet, Refills: 0    loratadine (CLARITIN) 10 MG  tablet Take 10 mg by mouth daily.    metFORMIN (GLUCOPHAGE) 1000 MG tablet Take 1 tablet (1,000 mg total) by mouth daily with breakfast. Qty: 30 tablet, Refills: 0    mirabegron ER (MYRBETRIQ) 50 MG TB24 tablet Take 1 tablet (50 mg total) by mouth daily. Qty: 90 tablet, Refills: 3    nitroGLYCERIN (NITROSTAT) 0.4 MG SL tablet Place 0.4 mg under the tongue every 5 (five) minutes as needed for chest pain.     predniSONE (DELTASONE) 20 MG tablet Take 20 mg by mouth daily.    primidone (MYSOLINE) 50 MG tablet Take 1 tablet by mouth 2 (two) times daily.     propranolol (INDERAL) 20 MG tablet Take 20 mg by mouth 2 (two) times daily.     tamsulosin (FLOMAX) 0.4 MG CAPS capsule Take 0.4 mg by mouth daily.         If you experience worsening of your admission symptoms, develop shortness of breath, life threatening emergency, suicidal or homicidal thoughts you must seek medical attention immediately by calling 911 or calling your MD immediately  if symptoms less severe.  You Must read complete instructions/literature along with all the possible adverse reactions/side effects for all the Medicines you take and that have been prescribed to you. Take any new Medicines after you have completely understood and accept all the possible adverse reactions/side effects.   Please note  You were cared for by a hospitalist during your hospital stay. If you have any questions about your discharge medications or the care you received while you were in the hospital after you are discharged, you can call the unit and asked to speak with the hospitalist on call if the hospitalist that took care of you is not available. Once you are discharged, your primary care physician will handle any further medical issues. Please note that NO REFILLS for any discharge medications will be authorized once you are discharged, as it is imperative that you return to your primary care physician (or establish a relationship with a primary care physician if you do not have one) for your aftercare needs so that they can reassess your need for medications and monitor your lab values. Today   SUBJECTIVE   Eating lunch. No vomting or choking on food  VITAL SIGNS:  Blood pressure (!) 138/51, pulse (!) 57, temperature 98.5 F (36.9 C), temperature source Oral, resp. rate 20, height 5' 9"  (1.753 m), weight 86.5 kg (190 lb 9.6 oz), SpO2 96 %.  I/O:   Intake/Output Summary (Last 24 hours) at 09/24/15 1238 Last data filed at 09/24/15 0900  Gross per 24 hour  Intake              240 ml   Output              175 ml  Net               65 ml    PHYSICAL EXAMINATION:  GENERAL:  75 y.o.-year-old patient lying in the bed with no acute distress.  EYES: Pupils equal, round, reactive to light and accommodation. No scleral icterus. Extraocular muscles intact.  HEENT: Head atraumatic, normocephalic. Oropharynx and nasopharynx clear.  NECK:  Supple, no jugular venous distention. No thyroid enlargement, no tenderness.  LUNGS: Normal breath sounds bilaterally, no wheezing, rales,rhonchi or crepitation. No use of accessory muscles of respiration.  CARDIOVASCULAR: S1, S2 normal. No murmurs, rubs, or gallops.  ABDOMEN: Soft, non-tender, non-distended. Bowel sounds present. No organomegaly or mass.  EXTREMITIES: No pedal edema, cyanosis, or clubbing.  NEUROLOGIC: Cranial nerves II through XII are intact. Muscle strength 5/5 in all extremities. Sensation intact. Gait not checked.  PSYCHIATRIC: The patient is alert and oriented x 3.  SKIN: No obvious rash, lesion, or ulcer.   DATA REVIEW:   CBC   Recent Labs Lab 09/24/15 0747  WBC 13.0*  HGB 14.1  HCT 40.4  PLT 190    Chemistries   Recent Labs Lab 09/23/15 2253  NA 131*  K 3.4*  CL 97*  CO2 19*  GLUCOSE 145*  BUN 16  CREATININE 1.03  CALCIUM 8.9  AST 23  ALT 25  ALKPHOS 73  BILITOT 0.7    Microbiology Results   No results found for this or any previous visit (from the past 240 hour(s)).  RADIOLOGY:  Dg Chest 2 View  Result Date: 09/23/2015 CLINICAL DATA:  Per EMS patient was eating a burger and potato chips and choked up on them tonight. Patient says he did not aspirate the food but immediately after that he felt chest pain and called EMS. hx of GERD, HTN EXAM: CHEST  2 VIEW COMPARISON:  11/25/2014 FINDINGS: The heart size and mediastinal contours are within normal limits. Both lungs are clear. Moderate mid thoracic spondylosis. Previous cervical fusion. IMPRESSION: No evidence for acute  abnormality.  Electronically Signed   By: Nolon Nations M.D.   On: 09/23/2015 23:38     Management plans discussed with the patient, family and they are in agreement.  CODE STATUS:     Code Status Orders        Start     Ordered   09/24/15 0540  Full code  Continuous     09/24/15 0539    Code Status History    Date Active Date Inactive Code Status Order ID Comments User Context   04/05/2015 10:36 AM 04/07/2015  4:54 PM Full Code [id number]  Fritzi Mandes, MD Inpatient   11/25/2014  7:23 AM 11/26/2014  9:01 PM Full Code [id number]  Hillary Bow, MD ED    Advance Directive Documentation   Flowsheet Row Most Recent Value  Type of Advance Directive  Healthcare Power of Attorney  Pre-existing out of facility DNR order (yellow form or pink MOST form)  No data  "MOST" Form in Place?  No data      TOTAL TIME TAKING CARE OF THIS PATIENT: 40 minutes.    Keion Neels M.D on 09/24/2015 at 12:38 PM  Between 7am to 6pm - Pager - [phone] After 6pm go to [email or web address] - password [email or web address] [facility]  [facility]  Office  [phone]  CC: Primary care physician; Leonel Ramsay, MD

## 2015-09-24 NOTE — Progress Notes (Signed)
Patient was discharged home. Tele discontinued. IV removed with cath intact. Reviewed meds and last dose given with wife and patient. Allowed time for questions.

## 2015-09-24 NOTE — Discharge Instructions (Signed)
Avoid spicy and fried food

## 2015-09-24 NOTE — Care Management Obs Status (Signed)
[facility] NOTIFICATION   Patient Details  Name: Aureliano Gosney MRN: [id number] Date of Birth: September 17, 1940   Medicare Observation Status Notification Given:  Yes    Alvie Heidelberg, RN 09/24/2015, 11:32 AM

## 2015-09-24 NOTE — Progress Notes (Signed)
Inpatient Diabetes Program Recommendations  AACE/ADA: New Consensus Statement on Inpatient Glycemic Control (2015)  Target Ranges:  Prepandial:   less than 140 mg/dL      Peak postprandial:   less than 180 mg/dL (1-2 hours)      Critically ill patients:  140 - 180 mg/dL   Lab Results  Component Value Date   GLUCAP 63 (L) 09/24/2015   HGBA1C 8.5 (H) 04/06/2015    Review of Glycemic Control  Results for SEDERICK, LASTINGER (MRN [id number]) as of 09/24/2015 08:25  Ref. Range 09/24/2015 07:37  Glucose-Capillary Latest Ref Range: 65 - 99 mg/dL 63 (L)     Diabetes history: Type 2- patient of Dr. Eddie Dibbles, A1C pending Outpatient Diabetes medications:V-Go 30(which means he gets 30 units of basal in 24 hours and he takes 4 units tid with meals) Current orders for Inpatient glycemic control: "insulin pump"  Inpatient Diabetes Program Recommendations:  Patient has his V-Go 30 mechanical pump on- he changed it yesterday afternoon.  He does not currently have another pump with him but his wife will bring his pump and his Humalog so he can change it this afternoon (this pump is disposable and needs to be changed q24 hours).  Site healthy on left upper thigh.  Gentry Fitz, RN, BA, MHA, CDE Diabetes Coordinator Inpatient Diabetes Program  [phone] (Team Pager) [phone] ([place]) 09/24/2015 8:29 AM

## 2015-10-15 ENCOUNTER — Other Ambulatory Visit: Payer: Medicare Other

## 2015-10-15 DIAGNOSIS — C61 Malignant neoplasm of prostate: Secondary | ICD-10-CM

## 2015-10-16 LAB — PSA: Prostate Specific Ag, Serum: 10.7 ng/mL — ABNORMAL HIGH (ref 0.0–4.0)

## 2015-10-20 ENCOUNTER — Ambulatory Visit (INDEPENDENT_AMBULATORY_CARE_PROVIDER_SITE_OTHER): Payer: Medicare Other | Admitting: Urology

## 2015-10-20 ENCOUNTER — Encounter: Payer: Self-pay | Admitting: Urology

## 2015-10-20 VITALS — BP 147/63 | HR 75 | Ht 69.0 in | Wt 189.0 lb

## 2015-10-20 DIAGNOSIS — N3941 Urge incontinence: Secondary | ICD-10-CM | POA: Diagnosis not present

## 2015-10-20 DIAGNOSIS — C61 Malignant neoplasm of prostate: Secondary | ICD-10-CM | POA: Diagnosis not present

## 2015-10-20 DIAGNOSIS — N3281 Overactive bladder: Secondary | ICD-10-CM

## 2015-10-20 NOTE — Progress Notes (Signed)
2:58 PM  10/20/15   Richard Chapman May 03, 1940 [id number]  Referring provider: Leonel Ramsay, MD [address] [place], [place] [address]  Chief Complaint  Patient presents with  . Prostate Cancer    4 month    HPI: 75 year old male with elevated PSA to 9.1 dx 09/2014 with Gleason 3+3 in 2 cores involving the right apex, up to 23% of tissue.  Rectal exam was benign. TRUS volume 60 cc.   He is on active surveillance and returns today for rectal exam and PSA.  He does have urinary frequency, urgency, and nocturia which is fairly significant.. He now wear depends for both urge incontinence as well as fecal incontinence.  He failed Mybetriq 25 mg, Vesicare 5 mg and most recently tried Mybretiq 50 mg daily which did not help enought.  He does admit to continued drinking of coffee, tea, sodas and beer on a regular basis (has not cut back).  He has also started smoking again.  He also has severe baseline ED.   PSA trend below:  Component     Latest Ref Rng & Units 01/29/2015 06/02/2015 10/15/2015  PSA     0.0 - 4.0 ng/mL 11.4 (H) 7.9 (H) 10.7 (H)   PMH: Past Medical History:  Diagnosis Date  . Anxiety and depression   . Depression   . Diabetes ([place])   . Elevated PSA   . GERD (gastroesophageal reflux disease)   . History of nephrolithiasis   . Hypertension   . Skin cancer   . Sleep apnea     Surgical History: Past Surgical History:  Procedure Laterality Date  . APPENDECTOMY    . BACK SURGERY    . KNEE SURGERY Bilateral   . NECK SURGERY    . SHOULDER SURGERY    . TONSILLECTOMY    . WRIST SURGERY      Home Medications:    Medication List       Accurate as of 10/20/15  2:58 PM. Always use your most recent med list.          amLODipine 10 MG tablet Commonly known as:  NORVASC Take 1 tablet (10 mg total) by mouth daily.   aspirin 81 MG tablet Take 1 tablet (81 mg total) by mouth daily.   atorvastatin 10 MG  tablet Commonly known as:  LIPITOR Take 5 mg by mouth daily.   clopidogrel 75 MG tablet Commonly known as:  PLAVIX Take 1 tablet (75 mg total) by mouth daily.   docusate sodium 100 MG capsule Commonly known as:  COLACE Take 1 capsule (100 mg total) by mouth 2 (two) times daily as needed for mild constipation.   escitalopram 20 MG tablet Commonly known as:  LEXAPRO Take 1 tablet (20 mg total) by mouth daily.   esomeprazole 20 MG capsule Commonly known as:  NEXIUM Take 1 capsule (20 mg total) by mouth AC breakfast.   furosemide 40 MG tablet Commonly known as:  LASIX Take 1 tablet (40 mg total) by mouth daily.   gabapentin 300 MG capsule Commonly known as:  NEURONTIN Take 300 mg by mouth at bedtime.   HUMALOG 100 UNIT/ML injection Generic drug:  insulin lispro Inject 0-80 Units into the skin daily. Via VGo kit   hyoscyamine 0.375 MG 12 hr tablet Commonly known as:  LEVBID Take 0.375 mg by mouth every 12 (twelve) hours.   isosorbide mononitrate 60 MG 24 hr tablet Commonly known as:  IMDUR Take 1 tablet (60 mg total) by mouth 2 (two) times daily.   loratadine 10 MG tablet Commonly known as:  CLARITIN Take 10 mg by mouth daily.   metFORMIN 1000 MG tablet Commonly known as:  GLUCOPHAGE Take 1 tablet (1,000 mg total) by mouth daily with breakfast.   nitroGLYCERIN 0.4 MG SL tablet Commonly known as:  NITROSTAT Place 0.4 mg under the tongue every 5 (five) minutes as needed for chest pain.   primidone 50 MG tablet Commonly known as:  MYSOLINE Take 1 tablet by mouth 2 (two) times daily.   propranolol 20 MG tablet Commonly known as:  INDERAL Take 20 mg by mouth 2 (two) times daily.   tamsulosin 0.4 MG Caps capsule Commonly known as:  FLOMAX Take 0.4 mg by mouth daily.       Allergies: No Known Allergies  Family History: Family History  Problem Relation Age of Onset  . Heart disease Mother   . Heart disease Father   . Diabetes Father   . Stroke Father   .  Kidney disease Neg Hx   . Prostate cancer Neg Hx     Social History:  reports that he quit smoking about 17 months ago. He has never used smokeless tobacco. He reports that he drinks about 1.2 oz of alcohol per week . He reports that he does not use drugs.  ROS: UROLOGY Frequent Urination?: Yes Hard to postpone urination?: Yes Burning/pain with urination?: No Get up at night to urinate?: Yes Leakage of urine?: No Urine stream starts and stops?: No Trouble starting stream?: No Do you have to strain to urinate?: No Blood in urine?: No Urinary tract infection?: No Sexually transmitted disease?: No Injury to kidneys or bladder?: No Painful intercourse?: No Weak stream?: No Erection problems?: Yes Penile pain?: No  Gastrointestinal Nausea?: No Vomiting?: No Indigestion/heartburn?: Yes Diarrhea?: No Constipation?: No  Constitutional Fever: No Night sweats?: No Weight loss?: No Fatigue?: No  Skin Skin rash/lesions?: No Itching?: Yes  Eyes Blurred vision?: No Double vision?: No  Ears/Nose/Throat Sore throat?: No Sinus problems?: Yes  Hematologic/Lymphatic Swollen glands?: No Easy bruising?: No  Cardiovascular Leg swelling?: No Chest pain?: Yes  Respiratory Cough?: Yes Shortness of breath?: No  Endocrine Excessive thirst?: No  Musculoskeletal Back pain?: Yes Joint pain?: No  Neurological Headaches?: No Dizziness?: Yes  Psychologic Depression?: Yes Anxiety?: No  Physical Exam: BP (!) 147/63   Pulse 75   Ht 5' 9"  (1.753 m)   Wt 189 lb (85.7 kg)   BMI 27.91 kg/m   Constitutional:  Alert and oriented, No acute distress.  Somewhat frail-appearing, ambulating with cane. HEENT: [place] AT, moist mucus membranes.  Trachea midline, no masses. Cardiovascular: No clubbing, cyanosis, or edema. Respiratory: Normal respiratory effort, no increased work of breathing. GI: Abdomen is soft, nontender, nondistended, no abdominal masses Rectal exam: Decreased  sphincter tone. Enlarged 50+ cc prostate, rubbery, nontender, no masses. Neurologic: Grossly intact, no focal deficits, moving all 4 extremities. Psychiatric: Normal mood and affect.  Laboratory Data: Lab Results  Component Value Date   WBC 13.0 (H) 09/24/2015   HGB 14.1 09/24/2015   HCT 40.4 09/24/2015   MCV 87.5 09/24/2015   PLT 190 09/24/2015    Lab Results  Component Value Date   CREATININE 1.03 09/23/2015   PSA trend as above  Pertinent Imaging: n/a  Assessment & Plan:    1. Prostate cancer ([place]) On active surveillance. Rectal exam unremarkable today, PSA stable up but history of flucuation  Recommend continuation of every 4 monthly PSA/tDRE and consideration of biopsy 1 year- discussed today in detail Given he is on ASA/ Plavix, alternative including endorectal MRI discussed- risks/ benefits discussed He would prefer MRI, understands that may need bx if lesion is appreciated - PSA - Urinalysis, Complete  2. Urge incontinence/ OAB Significant baseline urinary urgency and urge incontinence.  Has not implamented any behavioral modification as discussed- continues to drink coffee, beer, etc on regular basis including in middle of night  No role for medical/ procedure intervention unless implements behavioral modification    Return in about 4 weeks (around 11/17/2015) for f/u MRI, PSA (few days prior to visit).   Hollice Espy, MD  [facility] [address], [place], [place] [address] [phone]

## 2015-11-22 ENCOUNTER — Ambulatory Visit: Payer: Medicare Other | Attending: Neurology | Admitting: Physical Therapy

## 2015-11-22 ENCOUNTER — Encounter: Payer: Self-pay | Admitting: Physical Therapy

## 2015-11-22 DIAGNOSIS — M6281 Muscle weakness (generalized): Secondary | ICD-10-CM

## 2015-11-22 DIAGNOSIS — R262 Difficulty in walking, not elsewhere classified: Secondary | ICD-10-CM | POA: Insufficient documentation

## 2015-11-22 DIAGNOSIS — R296 Repeated falls: Secondary | ICD-10-CM | POA: Diagnosis present

## 2015-11-22 NOTE — Therapy (Signed)
[facility] [address] [place], [place], [address] Phone: [phone]   Fax:  [phone]  Physical Therapy Treatment  Patient Details  Name: Richard Chapman MRN: [id number] Date of Birth: 1940-04-29 Referring Provider: Vladimir Crofts MD  Encounter Date: 11/22/2015      PT End of Session - 11/23/15 0747    Visit Number 1   Number of Visits 8   Date for PT Re-Evaluation 12/20/15   Authorization - Visit Number 1   Authorization - Number of Visits 10   PT Start Time [id number]   PT Stop Time 1458   PT Time Calculation (min) 63 min   Equipment Utilized During Treatment Gait belt   Activity Tolerance Patient tolerated treatment well   Behavior During Therapy [facility] for tasks assessed/performed      Past Medical History:  Diagnosis Date  . Anxiety and depression   . Depression   . Diabetes ([place])   . Elevated PSA   . GERD (gastroesophageal reflux disease)   . History of nephrolithiasis   . Hypertension   . Skin cancer   . Sleep apnea     Past Surgical History:  Procedure Laterality Date  . APPENDECTOMY    . BACK SURGERY    . KNEE SURGERY Bilateral   . NECK SURGERY    . SHOULDER SURGERY    . TONSILLECTOMY    . WRIST SURGERY      There were no vitals filed for this visit.      Subjective Assessment - 11/22/15 1729    Subjective Pt states that he has noticed a worsening of his balance over the last 3 years. He suffered a fall in February of 2015 and suffered a subdural hematoma. Since that time he has been ambulating with single point cane for stability. He has a rolling walker and a wheel chair at home for long distances outside of the home. His wife and son assist with daily caretaking duties and medical management. Pt has fallen 5x within the last year but states that he has been more careful lately and he has not fallen recently. States that he tends to fall backwards when he does fall. Pt would like to be more stable on his  feet and able to ambulate with his cane for longer distances. Pt states that he feels weak in all of his muscles and doesn't feel he has the strength that he used to.   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 MRI c-spine: multilevel degenerative disc disease and post operative changes, some compression on spinal cord without cord signal changes   Currently in Pain? No/denies            [facility] PT Assessment - 11/22/15 0001      Assessment   Medical Diagnosis imbalance   Referring Provider Vladimir Crofts MD   Onset Date/Surgical Date 03/24/13   Hand Dominance Right   Prior Therapy none      Objective:  Therapeutic Exercise: SLR x10 R/L. Bridge x10. Sit<>stand x5 with UE assist on thighs.  Pt response for medical necessity: Pt able to tolerate therapeutic exercise following BERG balance assessment with moderate muscle shaking/weakness noted especially during straight leg raise exercise. He requires frequent cueing for tasks and reminders for proper form/set-up. Pt c/o of early onset of muscle fatigue after ten  reps of each exercise. Pt will benefit from skilled physical therapy to promote increase in LE strength, functional mobility, improved gait strategy and overall safety.      PT Education - 11/22/15 1738    Education provided Yes   Education Details see pt instructions for HEP   Person(s) Educated Patient;Spouse   Methods Explanation;Demonstration;Handout   Comprehension Verbalized understanding;Returned demonstration             PT Long Term Goals - 11/22/15 1748      PT LONG TERM GOAL #1   Title Pt will score >32/80 on LEFS to promote increase in functional mobility so he can be an active participant in daily tasks   Baseline 10/9: 25/80   Time 4   Period Weeks   Status New     PT LONG TERM GOAL #2   Title  Pt will score >44/56 on BERG balance assessment to decrease fall risk   Baseline 10/9: 39/56   Time 4   Period Weeks   Status New     PT LONG TERM GOAL #3   Title Pt will ambulate with least restrictive assistive device for 8 minutes with effective gait strategy emphasizing foot clearance and promoting upright posture to decrease fall risk   Baseline pt with short, shuffling gait with SPC with no reciprocal strategy. states he can walk for 2-3 minutes before requiring rest break   Time 4   Period Weeks   Status New     PT LONG TERM GOAL #4   Title Pt will perform continued in clinic exercise for >20 minutes without a seated rest break to promote increase in endurance so he can participate in family activities   Baseline pt limited in standing/exercise tolerance. currently no exercise plan   Time 4   Period Weeks   Status New           Plan - 11/22/15 1738    Clinical Impression Statement Pt is a pleasant 75 year old male referred to physical therapy for balance deficits following a subdural hematoma he suffered after a fall in February 2015. Since that date he has been ambulating with a SPC for in home and short distances and shows preference to a wheelchair for [place] distances due to onset of fatigue/weakness in lower extremities. Gait: pt ambulates with short, shuffling gait with SPC in RUE and no reciprocal strategy. He demonstrates limited hip/knee flexion during gait cycle with limited clearance bilaterally. Pt with limited heel strike/toe off bilaterally. Strength: MMT UE R/L shoulder flexion 4 bilat, shoulder abduction 4 bilat, biceps 4+ bilat, triceps 4+ bilat. LE R/L hip flexion 4-/4+, knee extension 4 bilaterally, knee flexion 5 bilat, dorsiflexion 4 bilat, plantarflexion 4+ bilat. Transfers: pt requires use of UE for sit<>stand t/f and min A for bed mobility. Balance: BERG 39/56. Pt with difficulty involving single leg and tandem stance tasks. Requires UE assist for  transfers secondary to strength deficits. Outcome Measures: LEFS 25/80   Rehab Potential Fair   PT Frequency 2x / week   PT Duration 4 weeks   PT Treatment/Interventions ADLs/Self Care Home Management;Aquatic Therapy;Biofeedback;Electrical Stimulation;Moist Heat;DME Instruction;Gait training;Stair training;Functional mobility training;Therapeutic activities;Balance training;Therapeutic exercise;Neuromuscular re-education;Patient/family education;Manual techniques;Passive range of motion;Orthotic Fit/Training   PT Next Visit Plan assess cane height/strategy, progressive balance exercises SLS   PT Home Exercise Plan See pt instructions: bridge, SLR, sit<>stand   Consulted and Agree with Plan of Care Patient;Family member/caregiver      Patient will benefit from skilled  therapeutic intervention in order to improve the following deficits and impairments:  Abnormal gait, Decreased activity tolerance, Decreased balance, Decreased coordination, Decreased endurance, Decreased mobility, Decreased strength, Difficulty walking, Impaired sensation, Improper body mechanics, Postural dysfunction  Visit Diagnosis: Difficulty in walking, not elsewhere classified  Repeated falls  Muscle weakness (generalized)       G-Codes - Dec 04, 2015 0748    Functional Assessment Tool Used Clinical impression/ LEFS/ [place]/ muscle weakness   Functional Limitation Mobility: Walking and moving around   Mobility: Walking and Moving Around Current Status [id number]) At least 40 percent but less than 60 percent impaired, limited or restricted   Mobility: Walking and Moving Around Goal Status [phone]) At least 20 percent but less than 40 percent impaired, limited or restricted      Problem List Patient Active Problem List   Diagnosis Date Noted  . Atypical chest pain 09/24/2015  . Elevated WBC count 05/05/2015  . 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 CVA (cerebral infarction) 04/06/2015  . Cerebral infarction  ([place]) 04/06/2015  . Malignant neoplasm of prostate ([place]) 01/29/2015  . Cervical disc disease 12/03/2014  . Chest pain 11/25/2014  . Arthritis 08/20/2014  . Benign fibroma of prostate 08/20/2014  . Clinical depression 08/20/2014  . Failure of erection 08/20/2014  . Enlarged prostate 08/20/2014  . Major depressive disorder with single episode 08/20/2014  . 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  . Benign essential HTN 08/04/2014  . Acute low back pain 04/23/2014  . Alteration in bowel elimination: incontinence 04/23/2014  . Urge incontinence 04/23/2014  . 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 Benign essential tremor 12/23/2013  . B12 deficiency 08/13/2013  . Chronic kidney disease (CKD), stage III (moderate) 08/13/2013  . Diabetes ([place]) 08/13/2013  . Hypoglycemia 08/13/2013  . Diabetes mellitus ([place]) 08/13/2013  . Type 2 diabetes mellitus ([place]) 08/13/2013  . Arteriosclerosis of coronary artery 07/24/2013  . Fatigue 07/24/2013  . Acid reflux 07/24/2013  . Combined fat and carbohydrate induced hyperlipemia 07/24/2013  . CAD in native artery 07/24/2013  . Gastro-esophageal reflux disease without esophagitis 07/24/2013  . 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 Subdural hematoma ([place]) 03/26/2013  . Traumatic subdural hemorrhage ([place]) 03/26/2013  . Abnormal prostate specific antigen 09/01/2012  . Benign prostatic hyperplasia with urinary obstruction 09/01/2012  . Elevated prostate specific antigen (PSA) 09/01/2012   Pura Spice, PT, DPT # [phone] Derrill Memo, SPT 11/23/2015, 7:49 AM  [facility] [address] [place], [place], [address] Phone: [phone]   Fax:  [phone]  Name: Kaynan Fairburn MRN: [id number] Date of Birth: September 15, 1940

## 2015-11-23 ENCOUNTER — Encounter: Payer: Self-pay | Admitting: Physical Therapy

## 2015-11-25 ENCOUNTER — Ambulatory Visit: Payer: Medicare Other | Admitting: Physical Therapy

## 2015-11-25 ENCOUNTER — Encounter: Payer: Self-pay | Admitting: Physical Therapy

## 2015-11-25 DIAGNOSIS — M6281 Muscle weakness (generalized): Secondary | ICD-10-CM

## 2015-11-25 DIAGNOSIS — R262 Difficulty in walking, not elsewhere classified: Secondary | ICD-10-CM

## 2015-11-25 DIAGNOSIS — R296 Repeated falls: Secondary | ICD-10-CM

## 2015-11-25 NOTE — Therapy (Signed)
[facility] [address] [place], [place], [address] Phone: [phone]   Fax:  [phone]  Physical Therapy Treatment  Patient Details  Name: Richard Chapman MRN: [id number] Date of Birth: 04/02/1940 Referring Provider: Vladimir Crofts MD  Encounter Date: 11/25/2015      PT End of Session - 11/25/15 1801    Visit Number 2   Number of Visits 8   Date for PT Re-Evaluation 12/20/15   Authorization - Visit Number 2   Authorization - Number of Visits 10   PT Start Time 1300   PT Stop Time [id number]   PT Time Calculation (min) 47 min   Equipment Utilized During Treatment Gait belt   Activity Tolerance Patient tolerated treatment well   Behavior During Therapy [facility] for tasks assessed/performed      Past Medical History:  Diagnosis Date  . Anxiety and depression   . Depression   . Diabetes ([place])   . Elevated PSA   . GERD (gastroesophageal reflux disease)   . History of nephrolithiasis   . Hypertension   . Skin cancer   . Sleep apnea     Past Surgical History:  Procedure Laterality Date  . APPENDECTOMY    . BACK SURGERY    . KNEE SURGERY Bilateral   . NECK SURGERY    . SHOULDER SURGERY    . TONSILLECTOMY    . WRIST SURGERY      There were no vitals filed for this visit.      Subjective Assessment - 11/25/15 1759    Subjective Pt states that he was sore after his last appointment in his leg muscles. Notes that he has experienced several falls at his house on a small ledge; states he has a tendency to scuff it with his toe and fall forward.    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 MRI c-spine: multilevel degenerative disc disease and post operative changes, some compression on spinal cord without cord signal changes   Currently in Pain? No/denies     Objective:  Nu  Step Level 4 - 8 minutes. (warm up/no charge).   Gait Training: pt ambulates in clinic hallway 12 minutes with SPC in [place]. Verbal cueing for reciprocal pattern with SPC and bilateral foot clearance. Pt with better carryover with reciprocal gait strategy; demonstrates difficulty with consistent foot clearance/heel strike.  Therapeutic Exercise: Heel raises BLE x15. Seated resisted dorsiflexion with green theraband 2x15 R/L. Seated resisted hamstring curls with green theraband 2x15. Marching in // bars with foot taps on 6'' step x30 R/L UE assist.   Neuromuscular Re-ed: In // bars with SPT CGA/SBA: taps on 6'' step with no UE assist x2 trials (3 minutes ea). Step ups on 3'' step x30. Tandem walking with no UE assist 30ft x6.   Pt response for medical necessity: Pt requires several seated rest breaks during therapy session due to fatigue but stays motivated for all tasks. Demonstrates early onset of fatigue of ankle dorsiflexors and hip flexors notable in gait and during therapeutic exercise tasks. Pt with multiple LOB during 6'' step taps with no UE assist; requires SPT min A to correct balance x1 otherwise he is able to use UE assist to correct balance.        PT Education - 11/25/15 1800    Education provided Yes   Education Details  See pt instructions for HEP: theraband resisted dorsiflexion and hamstring curl in seated. Gait training with use of cane for reciprocal gait pattern.   Person(s) Educated Patient;Spouse   Methods Explanation;Demonstration;Handout   Comprehension Verbalized understanding;Returned demonstration             PT Long Term Goals - 11/22/15 1748      PT LONG TERM GOAL #1   Title Pt will score >32/80 on LEFS to promote increase in functional mobility so he can be an active participant in daily tasks   Baseline 10/9: 25/80   Time 4   Period Weeks   Status New     PT LONG TERM GOAL #2   Title Pt will score >44/56 on BERG balance assessment to decrease fall  risk   Baseline 10/9: 39/56   Time 4   Period Weeks   Status New     PT LONG TERM GOAL #3   Title Pt will ambulate with least restrictive assistive device for 8 minutes with effective gait strategy emphasizing foot clearance and promoting upright posture to decrease fall risk   Baseline pt with short, shuffling gait with SPC with no reciprocal strategy. states he can walk for 2-3 minutes before requiring rest break   Time 4   Period Weeks   Status New     PT LONG TERM GOAL #4   Title Pt will perform continued in clinic exercise for >20 minutes without a seated rest break to promote increase in endurance so he can participate in family activities   Baseline pt limited in standing/exercise tolerance. currently no exercise plan   Time 4   Period Weeks   Status New               Plan - 11/25/15 1802    Clinical Impression Statement Pt demonstrates difficulty with stepping tasks with tendency to catch the front of his forefoot when he is stepping off 3'' step with both RLE/LLE. Requires several seated rest breaks during session due to fatigue. Pt responsive to ambulating with cane with reciprocal strategy with verbal cueing; he has a difficulty time responding to verbal cueing to maintain foot clearance and has a tendency for short shuffling gait which worsens as he fatigues.    Rehab Potential Fair   PT Frequency 2x / week   PT Duration 4 weeks   PT Treatment/Interventions ADLs/Self Care Home Management;Aquatic Therapy;Biofeedback;Electrical Stimulation;Moist Heat;DME Instruction;Gait training;Stair training;Functional mobility training;Therapeutic activities;Balance training;Therapeutic exercise;Neuromuscular re-education;Patient/family education;Manual techniques;Passive range of motion;Orthotic Fit/Training   PT Next Visit Plan stepping tasks, SLS tasks   PT Home Exercise Plan See pt instructions: bridge, SLR, sit<>stand, resisted DF, knee flexion   Consulted and Agree with Plan  of Care Patient;Family member/caregiver      Patient will benefit from skilled therapeutic intervention in order to improve the following deficits and impairments:  Abnormal gait, Decreased activity tolerance, Decreased balance, Decreased coordination, Decreased endurance, Decreased mobility, Decreased strength, Difficulty walking, Impaired sensation, Improper body mechanics, Postural dysfunction  Visit Diagnosis: Difficulty in walking, not elsewhere classified  Repeated falls  Muscle weakness (generalized)     Problem List Patient Active Problem List   Diagnosis Date Noted  . Atypical chest pain 09/24/2015  . Elevated WBC count 05/05/2015  . 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 CVA (cerebral infarction) 04/06/2015  . Cerebral infarction ([place]) 04/06/2015  . Malignant neoplasm of prostate ([place]) 01/29/2015  . Cervical disc disease 12/03/2014  . Chest pain 11/25/2014  . Arthritis 08/20/2014  .  Benign fibroma of prostate 08/20/2014  . Clinical depression 08/20/2014  . Failure of erection 08/20/2014  . Enlarged prostate 08/20/2014  . Major depressive disorder with single episode 08/20/2014  . 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  . Benign essential HTN 08/04/2014  . Acute low back pain 04/23/2014  . Alteration in bowel elimination: incontinence 04/23/2014  . Urge incontinence 04/23/2014  . 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 Benign essential tremor 12/23/2013  . B12 deficiency 08/13/2013  . Chronic kidney disease (CKD), stage III (moderate) 08/13/2013  . Diabetes ([place]) 08/13/2013  . Hypoglycemia 08/13/2013  . Diabetes mellitus ([place]) 08/13/2013  . Type 2 diabetes mellitus ([place]) 08/13/2013  . Arteriosclerosis of coronary artery 07/24/2013  . Fatigue 07/24/2013  . Acid reflux 07/24/2013  . Combined fat and carbohydrate induced hyperlipemia 07/24/2013  . CAD in native artery 07/24/2013  . Gastro-esophageal reflux disease without esophagitis  07/24/2013  . 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 Subdural hematoma ([place]) 03/26/2013  . Traumatic subdural hemorrhage ([place]) 03/26/2013  . Abnormal prostate specific antigen 09/01/2012  . Benign prostatic hyperplasia with urinary obstruction 09/01/2012  . Elevated prostate specific antigen (PSA) 09/01/2012   Pura Spice, PT, DPT # [id number] Mickel Baas [place] SPT 11/25/2015, 6:17 PM  [place] [address] [place], [place], [address] Phone: [phone]   Fax:  [phone]  Name: Richard Chapman MRN: [id number] Date of Birth: 20-Nov-1940

## 2015-11-29 ENCOUNTER — Ambulatory Visit: Payer: Medicare Other

## 2015-11-29 ENCOUNTER — Ambulatory Visit: Payer: Medicare Other | Admitting: Physical Therapy

## 2015-11-29 ENCOUNTER — Encounter: Payer: Self-pay | Admitting: Physical Therapy

## 2015-11-29 DIAGNOSIS — M6281 Muscle weakness (generalized): Secondary | ICD-10-CM

## 2015-11-29 DIAGNOSIS — R262 Difficulty in walking, not elsewhere classified: Secondary | ICD-10-CM

## 2015-11-29 DIAGNOSIS — R296 Repeated falls: Secondary | ICD-10-CM

## 2015-11-29 NOTE — Therapy (Signed)
[facility] [address]. [place], [place], [address] Phone: [phone]   Fax:  [phone]  Physical Therapy Treatment  Patient Details  Name: Richard Chapman MRN: [id number] Date of Birth: [birth year] Referring Provider: Vladimir Crofts MD  Encounter Date: 11/29/2015      PT End of Session - 11/29/15 1449    Visit Number 3   Number of Visits 8   Date for PT Re-Evaluation 12/20/15   Authorization - Visit Number 3   Authorization - Number of Visits 10   PT Start Time [id number]   PT Stop Time 1440   PT Time Calculation (min) 57 min   Equipment Utilized During Treatment Gait belt   Activity Tolerance Patient tolerated treatment well   Behavior During Therapy [facility] for tasks assessed/performed      Past Medical History:  Diagnosis Date  . Anxiety and depression   . Depression   . Diabetes ([place])   . Elevated PSA   . GERD (gastroesophageal reflux disease)   . History of nephrolithiasis   . Hypertension   . Skin cancer   . Sleep apnea     Past Surgical History:  Procedure Laterality Date  . APPENDECTOMY    . BACK SURGERY    . KNEE SURGERY Bilateral   . NECK SURGERY    . SHOULDER SURGERY    . TONSILLECTOMY    . WRIST SURGERY      There were no vitals filed for this visit.      Subjective Assessment - 11/29/15 1445    Subjective Pt states that he slept thirteen hours last night and awoke at 11am this morning. States he is feeling generalized body soreness which is not uncommon for him with changes in the weather; reports most of his pain is in his low back and hips.   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 MRI c-spine: multilevel degenerative disc disease and post operative changes, some compression on spinal cord without cord signal changes   Currently in Pain? Yes   Pain  Score 6    Pain Location Back   Pain Orientation Lower;Right   Pain Descriptors / Indicators Aching   Pain Type Chronic pain     Objective:   Nu Step Level 8 - 10 minutes. (warm up/no charge).    Gait Training: pt ambulates in clinic hallway 10 minutes with SPC in [place]. Verbal cueing for reciprocal pattern with SPC and bilateral foot clearance. Pt demonstrates good carry over from last session for reciprocal gait pattern with SPC. Pt with tendency to gravitate towards the wall on the left side for bilateral UE support; responsive to cueing to limit wall touch.   Therapeutic Exercise: Supine bridge 2x10, 1x10 with LE on air ex pad to simulate soft surface of mattress to address bed mobility. Supine<>sit x5 with cueing for set up. Supine SAQ with #4 2x10, 1x10 with OP R/L ea. Hooklying lumbar rotation x10 3 sec hold at end range. Open book R sided rotation x5. Standing hamstring stretch 30 sec x2 R/L. Partial lunge on 6'' step with UE assist on railings x10.     Pt response for medical necessity: Pt arrived at PT session with c/o of moderate mostly right sided low back pain. Pt with tenderness to palpation in R lumbar paraspinal/QL. Pt requires seated rest break following ambulation trial.  He notes mild "swimmy headedness" with supine<>sit task and has to stay seated to regain his composure. Pt with good tolerance of lumbar/cervical mobility tasks with noted decrease in LBP following tx session.        PT Education - 11/29/15 1448    Education provided Yes   Education Details See pt instructions for HEP: lumbar/cervical spine mobility/stretching program. Addressed functional bed mobility strategies. Pain relief for neck/low back pain.   Person(s) Educated Patient;Spouse   Methods Explanation;Demonstration;Handout   Comprehension Verbalized understanding;Returned demonstration             PT Long Term Goals - 11/22/15 1748      PT LONG TERM GOAL #1   Title Pt will score >32/80 on  LEFS to promote increase in functional mobility so he can be an active participant in daily tasks   Baseline 10/9: 25/80   Time 4   Period Weeks   Status New     PT LONG TERM GOAL #2   Title Pt will score >44/56 on BERG balance assessment to decrease fall risk   Baseline 10/9: 39/56   Time 4   Period Weeks   Status New     PT LONG TERM GOAL #3   Title Pt will ambulate with least restrictive assistive device for 8 minutes with effective gait strategy emphasizing foot clearance and promoting upright posture to decrease fall risk   Baseline pt with short, shuffling gait with SPC with no reciprocal strategy. states he can walk for 2-3 minutes before requiring rest break   Time 4   Period Weeks   Status New     PT LONG TERM GOAL #4   Title Pt will perform continued in clinic exercise for >20 minutes without a seated rest break to promote increase in endurance so he can participate in family activities   Baseline pt limited in standing/exercise tolerance. currently no exercise plan   Time 4   Period Weeks   Status New               Plan - 11/29/15 1449    Clinical Impression Statement Pt able to perform bed mobility indep with cueing for set up/strategy; demonstrates good consistent technique after instruction. Pt with aggravation of low back pain with ambulation task but does not limit activity tolerance. Pt with general hypomobility of cervical/lumbar spine; able to tolerate LE, UT and levator scap stretching during session. Pt with positive response to sidelying/hooklying lumbar stretches. Pt with earlier onset of fatigue with LLE during SAQ with manual OP.    Rehab Potential Fair   PT Frequency 2x / week   PT Duration 4 weeks   PT Treatment/Interventions ADLs/Self Care Home Management;Aquatic Therapy;Biofeedback;Electrical Stimulation;Moist Heat;DME Instruction;Gait training;Stair training;Functional mobility training;Therapeutic activities;Balance training;Therapeutic  exercise;Neuromuscular re-education;Patient/family education;Manual techniques;Passive range of motion;Orthotic Fit/Training   PT Next Visit Plan stepping tasks, SLS tasks, LE stretching program   PT Home Exercise Plan See pt instructions: bridge, SLR, sit<>stand, resisted DF, knee flexion. Lumbar/cervical spine mobility exercises.   Consulted and Agree with Plan of Care Patient;Family member/caregiver      Patient will benefit from skilled therapeutic intervention in order to improve the following deficits and impairments:  Abnormal gait, Decreased activity tolerance, Decreased balance, Decreased coordination, Decreased endurance, Decreased mobility, Decreased strength, Difficulty walking, Impaired sensation, Improper body mechanics, Postural dysfunction  Visit Diagnosis: Difficulty in walking, not elsewhere classified  Repeated falls  Muscle weakness (generalized)     Problem List Patient Active Problem List  Diagnosis Date Noted  . Atypical chest pain 09/24/2015  . Elevated WBC count 05/05/2015  . 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 CVA (cerebral infarction) 04/06/2015  . Cerebral infarction ([place]) 04/06/2015  . Malignant neoplasm of prostate ([place]) 01/29/2015  . Cervical disc disease 12/03/2014  . Chest pain 11/25/2014  . Arthritis 08/20/2014  . Benign fibroma of prostate 08/20/2014  . Clinical depression 08/20/2014  . Failure of erection 08/20/2014  . Enlarged prostate 08/20/2014  . Major depressive disorder with single episode 08/20/2014  . 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  . Benign essential HTN 08/04/2014  . Acute low back pain 04/23/2014  . Alteration in bowel elimination: incontinence 04/23/2014  . Urge incontinence 04/23/2014  . 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 Benign essential tremor 12/23/2013  . B12 deficiency 08/13/2013  . Chronic kidney disease (CKD), stage III (moderate) 08/13/2013  . Diabetes ([place]) 08/13/2013  .  Hypoglycemia 08/13/2013  . Diabetes mellitus ([place]) 08/13/2013  . Type 2 diabetes mellitus ([place]) 08/13/2013  . Arteriosclerosis of coronary artery 07/24/2013  . Fatigue 07/24/2013  . Acid reflux 07/24/2013  . Combined fat and carbohydrate induced hyperlipemia 07/24/2013  . CAD in native artery 07/24/2013  . Gastro-esophageal reflux disease without esophagitis 07/24/2013  . 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 Subdural hematoma ([place]) 03/26/2013  . Traumatic subdural hemorrhage ([place]) 03/26/2013  . Abnormal prostate specific antigen 09/01/2012  . Benign prostatic hyperplasia with urinary obstruction 09/01/2012  . Elevated prostate specific antigen (PSA) 09/01/2012   Pura Spice, PT, DPT # [phone] Derrill Memo, SPT 11/30/2015, 8:19 AM  [facility] [address] [place], [place], [address] Phone: [phone]   Fax:  [phone]  Name: Cadyn Shadowens MRN: [id number] Date of Birth: [birth year]

## 2015-12-01 ENCOUNTER — Ambulatory Visit: Payer: Medicare Other | Admitting: Physical Therapy

## 2015-12-01 ENCOUNTER — Encounter: Payer: Self-pay | Admitting: Physical Therapy

## 2015-12-01 DIAGNOSIS — M6281 Muscle weakness (generalized): Secondary | ICD-10-CM

## 2015-12-01 DIAGNOSIS — R296 Repeated falls: Secondary | ICD-10-CM

## 2015-12-01 DIAGNOSIS — R262 Difficulty in walking, not elsewhere classified: Secondary | ICD-10-CM

## 2015-12-01 NOTE — Therapy (Signed)
[facility] [address] [place], [place], [address] Phone: [phone]   Fax:  [phone]  Physical Therapy Treatment  Patient Details  Name: Richard Chapman MRN: [id number] Date of Birth: Sep 19, 1940 Referring Provider: Vladimir Crofts MD  Encounter Date: 12/01/2015      PT End of Session - 12/01/15 1542    Visit Number 4   Number of Visits 8   Date for PT Re-Evaluation 12/20/15   Authorization - Visit Number 4   Authorization - Number of Visits 10   PT Start Time [id number]   PT Stop Time [id number]   PT Time Calculation (min) 52 min   Equipment Utilized During Treatment Gait belt   Activity Tolerance Patient tolerated treatment well   Behavior During Therapy [facility] for tasks assessed/performed      Past Medical History:  Diagnosis Date  . Anxiety and depression   . Depression   . Diabetes ([place])   . Elevated PSA   . GERD (gastroesophageal reflux disease)   . History of nephrolithiasis   . Hypertension   . Skin cancer   . Sleep apnea     Past Surgical History:  Procedure Laterality Date  . APPENDECTOMY    . BACK SURGERY    . KNEE SURGERY Bilateral   . NECK SURGERY    . SHOULDER SURGERY    . TONSILLECTOMY    . WRIST SURGERY      There were no vitals filed for this visit.      Subjective Assessment - 12/01/15 1541    Subjective Pt states that he is doing well with no new c/o. States he is in no pain today; experienced mild soreness after his last visit but nothing today. Pt states he has been doing HEP program including low back and neck mobility exercises.   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 MRI c-spine: multilevel degenerative disc disease and post operative changes, some compression on spinal cord without cord signal changes   Currently in Pain? No/denies      Objective:  Nu Step 10 minutes Level 7 (warm up/no charge).   Therapeutic Exercise: Seated hip flexion x30 R/L. Seated LAQ with #4 3x20 R/L. Seated knee flexion with GTB 3x20 R/L. Pt requires repeated cueing to decrease speed of exercise; is partially responsive to feedback.   Neuromuscular Re-ed: In // bars with no UE support with SPT CGA: marching in place 4 minutes, marching with alternating UE touch on knee 3 minutes, Tandem walking 24ft x4/backwards tandem walking 3ft x4, backwards walking with emphasis on increased step length 67ft x6. Lateral step with straight leg 31ft x4; pt requires repeated cueing to increase step length and achieve functional foot clearance. 3'' step taps with alternating LE x30 R/L. 3'' step ups alternating LE x20 - pt with difficulty achieving clearance with tendency to catch forefoot on edge of step with step down/up.   Pt response for medical necessity: Pt with no c/o of pain during therapy session but requires multiple seated rest breaks with standing exercise; able to resume activity with rest breaks <1 minute. Pt continues to demonstrate difficulty with bilateral foot clearance with stepping tasks; shows improvement with cueing for increased step length.       PT Long Term Goals - 11/22/15 1748      PT LONG TERM GOAL #1  Title Pt will score >32/80 on LEFS to promote increase in functional mobility so he can be an active participant in daily tasks   Baseline 10/9: 25/80   Time 4   Period Weeks   Status New     PT LONG TERM GOAL #2   Title Pt will score >44/56 on BERG balance assessment to decrease fall risk   Baseline 10/9: 39/56   Time 4   Period Weeks   Status New     PT LONG TERM GOAL #3   Title Pt will ambulate with least restrictive assistive device for 8 minutes with effective gait strategy emphasizing foot clearance and promoting upright posture to decrease fall risk   Baseline pt with short, shuffling gait with SPC with no reciprocal  strategy. states he can walk for 2-3 minutes before requiring rest break   Time 4   Period Weeks   Status New     PT LONG TERM GOAL #4   Title Pt will perform continued in clinic exercise for >20 minutes without a seated rest break to promote increase in endurance so he can participate in family activities   Baseline pt limited in standing/exercise tolerance. currently no exercise plan   Time 4   Period Weeks   Status New            Plan - 12/01/15 1546    Clinical Impression Statement Pt requires multiple seated rest breaks with standing exercises today due to onset of fatigue. Has difficulty with clearance of bilat LE when performing step up/step taps. Responsive to cueing for increased step length to manage obstacle with less instances of catching forefoot on step.    Rehab Potential Fair   PT Frequency 2x / week   PT Duration 4 weeks   PT Treatment/Interventions ADLs/Self Care Home Management;Aquatic Therapy;Biofeedback;Electrical Stimulation;Moist Heat;DME Instruction;Gait training;Stair training;Functional mobility training;Therapeutic activities;Balance training;Therapeutic exercise;Neuromuscular re-education;Patient/family education;Manual techniques;Passive range of motion;Orthotic Fit/Training   PT Next Visit Plan stepping tasks, SLS tasks, LE stretching program   PT Home Exercise Plan See pt instructions: bridge, SLR, sit<>stand, resisted DF, knee flexion. Lumbar/cervical spine mobility exercises.   Consulted and Agree with Plan of Care Patient;Family member/caregiver      Patient will benefit from skilled therapeutic intervention in order to improve the following deficits and impairments:  Abnormal gait, Decreased activity tolerance, Decreased balance, Decreased coordination, Decreased endurance, Decreased mobility, Decreased strength, Difficulty walking, Impaired sensation, Improper body mechanics, Postural dysfunction  Visit Diagnosis: Difficulty in walking, not  elsewhere classified  Repeated falls  Muscle weakness (generalized)     Problem List Patient Active Problem List   Diagnosis Date Noted  . Atypical chest pain 09/24/2015  . Elevated WBC count 05/05/2015  . 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 CVA (cerebral infarction) 04/06/2015  . Cerebral infarction ([place]) 04/06/2015  . Malignant neoplasm of prostate ([place]) 01/29/2015  . Cervical disc disease 12/03/2014  . Chest pain 11/25/2014  . Arthritis 08/20/2014  . Benign fibroma of prostate 08/20/2014  . Clinical depression 08/20/2014  . Failure of erection 08/20/2014  . Enlarged prostate 08/20/2014  . Major depressive disorder with single episode 08/20/2014  . 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  . Benign essential HTN 08/04/2014  . Acute low back pain 04/23/2014  . Alteration in bowel elimination: incontinence 04/23/2014  . Urge incontinence 04/23/2014  . 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 Benign essential tremor 12/23/2013  . B12 deficiency 08/13/2013  . Chronic kidney disease (CKD), stage III (moderate) 08/13/2013  .  Diabetes ([place]) 08/13/2013  . Hypoglycemia 08/13/2013  . Diabetes mellitus ([place]) 08/13/2013  . Type 2 diabetes mellitus ([place]) 08/13/2013  . Arteriosclerosis of coronary artery 07/24/2013  . Fatigue 07/24/2013  . Acid reflux 07/24/2013  . Combined fat and carbohydrate induced hyperlipemia 07/24/2013  . CAD in native artery 07/24/2013  . Gastro-esophageal reflux disease without esophagitis 07/24/2013  . 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 Subdural hematoma ([place]) 03/26/2013  . Traumatic subdural hemorrhage ([place]) 03/26/2013  . Abnormal prostate specific antigen 09/01/2012  . Benign prostatic hyperplasia with urinary obstruction 09/01/2012  . Elevated prostate specific antigen (PSA) 09/01/2012   Pura Spice, PT, DPT # [phone] Mickel Baas Ina Scrivens SPT 12/01/2015, 5:12 PM  [facility]  [facility] [address] [place], [place], [address] Phone: [phone]   Fax:  [phone]  Name: Richard Chapman MRN: [id number] Date of Birth: [birth year]

## 2015-12-06 ENCOUNTER — Encounter: Payer: Medicare Other | Admitting: Physical Therapy

## 2015-12-08 ENCOUNTER — Ambulatory Visit: Payer: Medicare Other | Admitting: Physical Therapy

## 2015-12-08 DIAGNOSIS — R262 Difficulty in walking, not elsewhere classified: Secondary | ICD-10-CM | POA: Diagnosis not present

## 2015-12-08 DIAGNOSIS — R296 Repeated falls: Secondary | ICD-10-CM

## 2015-12-08 DIAGNOSIS — M6281 Muscle weakness (generalized): Secondary | ICD-10-CM

## 2015-12-09 NOTE — Therapy (Signed)
[facility] [address] [place], [place], [address] Phone: [phone]   Fax:  [phone]  Physical Therapy Treatment  Patient Details  Name: Richard Chapman MRN: [id number] Date of Birth: 1940/08/06 Referring Provider: Vladimir Crofts MD  Encounter Date: 12/08/2015      PT End of Session - 12/09/15 0829    Visit Number 5   Number of Visits 8   Date for PT Re-Evaluation 12/20/15   Authorization - Visit Number 5   Authorization - Number of Visits 10   PT Start Time 1344   PT Stop Time 1442   PT Time Calculation (min) 58 min   Equipment Utilized During Treatment Gait belt   Activity Tolerance Patient tolerated treatment well;Patient limited by fatigue   Behavior During Therapy [facility] for tasks assessed/performed      Past Medical History:  Diagnosis Date  . Anxiety and depression   . Depression   . Diabetes ([place])   . Elevated PSA   . GERD (gastroesophageal reflux disease)   . History of nephrolithiasis   . Hypertension   . Skin cancer   . Sleep apnea     Past Surgical History:  Procedure Laterality Date  . APPENDECTOMY    . BACK SURGERY    . KNEE SURGERY Bilateral   . NECK SURGERY    . SHOULDER SURGERY    . TONSILLECTOMY    . WRIST SURGERY      There were no vitals filed for this visit.      Subjective Assessment - 12/09/15 0809    Subjective Pt. states he was feeling sick the other day which is why he cancelled PT but is doing much better today.  No c/o pain.  Pt. entered PT with use of SPC for safety with walking.     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 MRI c-spine: multilevel degenerative disc disease and post operative changes, some compression on spinal cord without cord signal changes   Currently in Pain? No/denies      Objective:  Nu Step 10  minutes Level 7 (warm up/no charge).   Therapeutic Exercise: Seated/standing 4# LE ex. (hip flexion/ LAQ/ heel raises/ knee flexion) x30 R/L.  Seated knee flexion with GTB 20x R/L.  10x sit to stands (no UE assist): 41.81 sec.    Neuromuscular Re-ed:  Tandem walking 19ft x4/backwards tandem walking 34ft x4, backwards walking with emphasis on increased step length 37ft x6. Lateral step with knee flexion 56ft x4; pt requires repeated cueing to increase step length/ hip flexion and achieve functional foot clearance. 3'' step taps with alternating LE x30 R/L.  Functional reaching with cone (elevated table)- L/R UE with CGA for safety (challenging).  Cone taps (difficulty with L LE during R LE SLS).  Walking in hallway/ outside to car with and without use of SPC working on consistent hip flexion/ step pattern/ heel strike with cuing for posture/ CGA for safety.    Pt response for medical necessity: Pt with no c/o of pain during therapy session but requires multiple seated rest breaks with standing exercise; able to resume activity with rest breaks <1 minute. Pt continues to demonstrate difficulty with bilateral foot clearance with stepping tasks; shows improvement with cueing for increased step length.       PT Long Term Goals - 11/22/15 [id number]  PT LONG TERM GOAL #1   Title Pt will score >32/80 on LEFS to promote increase in functional mobility so he can be an active participant in daily tasks   Baseline 10/9: 25/80   Time 4   Period Weeks   Status New     PT LONG TERM GOAL #2   Title Pt will score >44/56 on BERG balance assessment to decrease fall risk   Baseline 10/9: 39/56   Time 4   Period Weeks   Status New     PT LONG TERM GOAL #3   Title Pt will ambulate with least restrictive assistive device for 8 minutes with effective gait strategy emphasizing foot clearance and promoting upright posture to decrease fall risk   Baseline pt with short, shuffling gait with SPC with no reciprocal  strategy. states he can walk for 2-3 minutes before requiring rest break   Time 4   Period Weeks   Status New     PT LONG TERM GOAL #4   Title Pt will perform continued in clinic exercise for >20 minutes without a seated rest break to promote increase in endurance so he can participate in family activities   Baseline pt limited in standing/exercise tolerance. currently no exercise plan   Time 4   Period Weeks   Status New               Plan - 12/09/15 [id number]    Clinical Impression Statement Pt. has difficulty with L LE cone taps/ control of L foot placement with SLS on R LE.  Moderate cuing to increase L hip flexion/ swing through phase of gait to decrease fall risk with and without use of SPC.  No c/o pain during tx. session but several short seated rest breaks required to complete ther.ex./ balance tasks.  Good motivation with PT tx. session.     Rehab Potential Fair   PT Frequency 2x / week   PT Duration 4 weeks   PT Treatment/Interventions ADLs/Self Care Home Management;Aquatic Therapy;Biofeedback;Electrical Stimulation;Moist Heat;DME Instruction;Gait training;Stair training;Functional mobility training;Therapeutic activities;Balance training;Therapeutic exercise;Neuromuscular re-education;Patient/family education;Manual techniques;Passive range of motion;Orthotic Fit/Training   PT Next Visit Plan Progressing balance/ LE proprioception.  Increase L hip flexion/ stride through with gait.     PT Home Exercise Plan See pt instructions: bridge, SLR, sit<>stand, resisted DF, knee flexion. Lumbar/cervical spine mobility exercises.   Consulted and Agree with Plan of Care Patient;Family member/caregiver      Patient will benefit from skilled therapeutic intervention in order to improve the following deficits and impairments:  Abnormal gait, Decreased activity tolerance, Decreased balance, Decreased coordination, Decreased endurance, Decreased mobility, Decreased strength, Difficulty  walking, Impaired sensation, Improper body mechanics, Postural dysfunction  Visit Diagnosis: Difficulty in walking, not elsewhere classified  Repeated falls  Muscle weakness (generalized)     Problem List Patient Active Problem List   Diagnosis Date Noted  . Atypical chest pain 09/24/2015  . Elevated WBC count 05/05/2015  . 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 CVA (cerebral infarction) 04/06/2015  . Cerebral infarction ([place]) 04/06/2015  . Malignant neoplasm of prostate ([place]) 01/29/2015  . Cervical disc disease 12/03/2014  . Chest pain 11/25/2014  . Arthritis 08/20/2014  . Benign fibroma of prostate 08/20/2014  . Clinical depression 08/20/2014  . Failure of erection 08/20/2014  . Enlarged prostate 08/20/2014  . Major depressive disorder with single episode 08/20/2014  . 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  . Benign essential HTN 08/04/2014  . Acute low back  pain 04/23/2014  . Alteration in bowel elimination: incontinence 04/23/2014  . Urge incontinence 04/23/2014  . 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 Benign essential tremor 12/23/2013  . B12 deficiency 08/13/2013  . Chronic kidney disease (CKD), stage III (moderate) 08/13/2013  . Diabetes ([place]) 08/13/2013  . Hypoglycemia 08/13/2013  . Diabetes mellitus ([place]) 08/13/2013  . Type 2 diabetes mellitus ([place]) 08/13/2013  . Arteriosclerosis of coronary artery 07/24/2013  . Fatigue 07/24/2013  . Acid reflux 07/24/2013  . Combined fat and carbohydrate induced hyperlipemia 07/24/2013  . CAD in native artery 07/24/2013  . Gastro-esophageal reflux disease without esophagitis 07/24/2013  . 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 Subdural hematoma ([place]) 03/26/2013  . Traumatic subdural hemorrhage ([place]) 03/26/2013  . Abnormal prostate specific antigen 09/01/2012  . Benign prostatic hyperplasia with urinary obstruction 09/01/2012  . Elevated prostate specific antigen (PSA) 09/01/2012    Pura Spice, PT, DPT # [phone] 12/09/2015, 8:39 AM  [place] [facility] [address] [place], [place], [address] Phone: [phone]   Fax:  [phone]  Name: Richard Chapman MRN: [id number] Date of Birth: 1941/01/14

## 2015-12-13 ENCOUNTER — Ambulatory Visit: Payer: Medicare Other | Admitting: Physical Therapy

## 2015-12-13 ENCOUNTER — Encounter: Payer: Self-pay | Admitting: Physical Therapy

## 2015-12-13 DIAGNOSIS — R262 Difficulty in walking, not elsewhere classified: Secondary | ICD-10-CM

## 2015-12-13 DIAGNOSIS — M6281 Muscle weakness (generalized): Secondary | ICD-10-CM

## 2015-12-13 DIAGNOSIS — R296 Repeated falls: Secondary | ICD-10-CM

## 2015-12-13 NOTE — Therapy (Signed)
[facility] [address] [place], [place], [address] Phone: [phone]   Fax:  [phone]  Physical Therapy Treatment  Patient Details  Name: Richard Chapman MRN: [id number] Date of Birth: Oct 24, 1940 Referring Provider: Vladimir Crofts MD  Encounter Date: 12/13/2015      PT End of Session - 12/13/15 1344    Visit Number 6   Number of Visits 8   Date for PT Re-Evaluation 12/20/15   Authorization - Visit Number 6   Authorization - Number of Visits 10   PT Start Time [id number]   Equipment Utilized During Treatment Gait belt   Activity Tolerance Patient tolerated treatment well;Patient limited by fatigue   Behavior During Therapy [facility] for tasks assessed/performed      Past Medical History:  Diagnosis Date  . Anxiety and depression   . Depression   . Diabetes ([place])   . Elevated PSA   . GERD (gastroesophageal reflux disease)   . History of nephrolithiasis   . Hypertension   . Skin cancer   . Sleep apnea     Past Surgical History:  Procedure Laterality Date  . APPENDECTOMY    . BACK SURGERY    . KNEE SURGERY Bilateral   . NECK SURGERY    . SHOULDER SURGERY    . TONSILLECTOMY    . WRIST SURGERY      There were no vitals filed for this visit.      Subjective Assessment - 12/13/15 1344    Subjective Pt. reports no pain or new complaints.  Pt. states he has chronic neck pain which really bothers him at night.     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 MRI c-spine: multilevel degenerative disc disease and post operative changes, some compression on spinal cord without cord signal changes   Patient Stated Goals Increase LE muscle strength/ balance/ gait.    Currently in Pain? No/denies      Objective:  Nu Step 10 minutes Level 7 (warm up/no charge)- consistent cadence.    Therapeutic Exercise: Seated/standing 4# LE ex. (hip flexion/ LAQ/ heel raises/ knee flexion) x30 R/L.  Seated knee flexion with GTB 20x R/L.  10x sit to stands (no UE assist): 41.81 sec.    Neuromuscular Re-ed:  Step ups/ down on 6" step with cuing to not use UE assist 20x.   Tandem walking 11ft x4/backwards tandem walking 23ft x4, backwards walking with emphasis on increased step length 20ft x6. Lateral step with knee flexion 28ft x4; pt requires repeated cueing to increase step length/ hip flexion and achieve functional foot clearance. 3''step taps with alternating LE x30 R/L.  Functional reaching with cone (elevated table)- L/R UE with CGA for safety (challenging).  Cone taps (difficulty with L LE during R LE SLS).  Walking in hallway/ outside to car with and without use of SPC working on consistent hip flexion/ step pattern/ heel strike with cuing for posture/ CGA for safety.         PT Long Term Goals - 11/22/15 1748      PT LONG TERM GOAL #1   Title Pt will score >32/80 on LEFS to promote increase in functional mobility so he can be an active participant in daily tasks   Baseline 10/9: 25/80   Time 4   Period Weeks   Status New  PT LONG TERM GOAL #2   Title Pt will score >44/56 on BERG balance assessment to decrease fall risk   Baseline 10/9: 39/56   Time 4   Period Weeks   Status New     PT LONG TERM GOAL #3   Title Pt will ambulate with least restrictive assistive device for 8 minutes with effective gait strategy emphasizing foot clearance and promoting upright posture to decrease fall risk   Baseline pt with short, shuffling gait with SPC with no reciprocal strategy. states he can walk for 2-3 minutes before requiring rest break   Time 4   Period Weeks   Status New     PT LONG TERM GOAL #4   Title Pt will perform continued in clinic exercise for >20 minutes without a seated rest break to promote increase in endurance so he can participate in family activities    Baseline pt limited in standing/exercise tolerance. currently no exercise plan   Time 4   Period Weeks   Status New             Patient will benefit from skilled therapeutic intervention in order to improve the following deficits and impairments:     Visit Diagnosis: No diagnosis found.     Problem List Patient Active Problem List   Diagnosis Date Noted  . Atypical chest pain 09/24/2015  . Elevated WBC count 05/05/2015  . 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 CVA (cerebral infarction) 04/06/2015  . Cerebral infarction ([place]) 04/06/2015  . Malignant neoplasm of prostate ([place]) 01/29/2015  . Cervical disc disease 12/03/2014  . Chest pain 11/25/2014  . Arthritis 08/20/2014  . Benign fibroma of prostate 08/20/2014  . Clinical depression 08/20/2014  . Failure of erection 08/20/2014  . Enlarged prostate 08/20/2014  . Major depressive disorder with single episode 08/20/2014  . 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  . Benign essential HTN 08/04/2014  . Acute low back pain 04/23/2014  . Alteration in bowel elimination: incontinence 04/23/2014  . Urge incontinence 04/23/2014  . 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 Benign essential tremor 12/23/2013  . B12 deficiency 08/13/2013  . Chronic kidney disease (CKD), stage III (moderate) 08/13/2013  . Diabetes ([place]) 08/13/2013  . Hypoglycemia 08/13/2013  . Diabetes mellitus ([place]) 08/13/2013  . Type 2 diabetes mellitus ([place]) 08/13/2013  . Arteriosclerosis of coronary artery 07/24/2013  . Fatigue 07/24/2013  . Acid reflux 07/24/2013  . Combined fat and carbohydrate induced hyperlipemia 07/24/2013  . CAD in native artery 07/24/2013  . Gastro-esophageal reflux disease without esophagitis 07/24/2013  . 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 Subdural hematoma ([place]) 03/26/2013  . Traumatic subdural hemorrhage ([place]) 03/26/2013  . Abnormal prostate specific antigen 09/01/2012  . Benign  prostatic hyperplasia with urinary obstruction 09/01/2012  . Elevated prostate specific antigen (PSA) 09/01/2012    Pura Spice 12/13/2015, 1:45 PM  [place] [facility] [address] [place], [place], [address] Phone: [phone]   Fax:  [phone]  Name: Richard Chapman MRN: [id number] Date of Birth: 1940/10/15

## 2015-12-13 NOTE — Therapy (Signed)
[facility] [address] [place], [place], [address] Phone: [phone]   Fax:  [phone]  Physical Therapy Treatment  Patient Details  Name: Richard Chapman MRN: [id number] Date of Birth: 01/11/1941 Referring Provider: Vladimir Crofts MD  Encounter Date: 12/13/2015      PT End of Session - 12/13/15 1344    Visit Number 6   Number of Visits 8   Date for PT Re-Evaluation 12/20/15   Authorization - Visit Number 6   Authorization - Number of Visits 10   PT Start Time [id number]   PT Stop Time 1431   PT Time Calculation (min) 49 min   Equipment Utilized During Treatment Gait belt   Activity Tolerance Patient tolerated treatment well;Patient limited by fatigue   Behavior During Therapy [facility] for tasks assessed/performed      Past Medical History:  Diagnosis Date  . Anxiety and depression   . Depression   . Diabetes ([place])   . Elevated PSA   . GERD (gastroesophageal reflux disease)   . History of nephrolithiasis   . Hypertension   . Skin cancer   . Sleep apnea     Past Surgical History:  Procedure Laterality Date  . APPENDECTOMY    . BACK SURGERY    . KNEE SURGERY Bilateral   . NECK SURGERY    . SHOULDER SURGERY    . TONSILLECTOMY    . WRIST SURGERY      There were no vitals filed for this visit.      Subjective Assessment - 12/13/15 1344    Subjective Pt. reports no pain or new complaints.  Pt. states he has chronic neck pain which really bothers him at night.     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 MRI c-spine: multilevel degenerative disc disease and post operative changes, some compression on spinal cord without cord signal changes   Patient Stated Goals Increase LE muscle strength/ balance/ gait.    Currently in Pain? No/denies      Objective:  Therapeutic  Exercise: Nu Step Level 6 10 minutes (warm up/no charge). Seated hip flexion with #1 x30 R/L. Seated knee extension #1 x30 R/L. In // bars with UE assist: hip flexion #1 R/L x60. Hip flexion with knee extension x30. Long lever hip flexion/extension #1 R/L x30. Lateral step with #1 78ft x6.  Neuromuscular Re-ed: In // bars with UE assist prn and SPT SBA/CGA: partial lunge on 6'' step x20 R/L. Navigation of obstacle course: 2 3'' step, 1 6'' step with step through pattern over 3'' step - pt performed x6 with no UE assist. Lateral walk on 6'' raised step with lateral step down/up 49ft x4. Forward walk on 6'' raised step with forward step down/up.    Pt response for medical necessity: Pt with no c/o of pain during session; requests several seated rest breaks secondary to onset of LE muscle fatigue. Pt with initial difficulty achieving clearance with stepping tasks; demonstrates improvement by end of session with appropriate clearance. No LOB requiring SPT assist this session; pt able to check balance with UE assist on // bars as needed.       PT Long Term Goals - 11/22/15 1748      PT LONG TERM GOAL #1   Title Pt will score >32/80 on LEFS to promote increase in functional mobility so  he can be an active participant in daily tasks   Baseline 10/9: 25/80   Time 4   Period Weeks   Status New     PT LONG TERM GOAL #2   Title Pt will score >44/56 on BERG balance assessment to decrease fall risk   Baseline 10/9: 39/56   Time 4   Period Weeks   Status New     PT LONG TERM GOAL #3   Title Pt will ambulate with least restrictive assistive device for 8 minutes with effective gait strategy emphasizing foot clearance and promoting upright posture to decrease fall risk   Baseline pt with short, shuffling gait with SPC with no reciprocal strategy. states he can walk for 2-3 minutes before requiring rest break   Time 4   Period Weeks   Status New     PT LONG TERM GOAL #4   Title Pt will perform  continued in clinic exercise for >20 minutes without a seated rest break to promote increase in endurance so he can participate in family activities   Baseline pt limited in standing/exercise tolerance. currently no exercise plan   Time 4   Period Weeks   Status New           Plan - 12/13/15 1803    Clinical Impression Statement Pt with tendency to perform tasks with increased speed leading to reliance on UE assist for balance; requires repeated cueing to maintain no UE assist throughout session. Pt able to perform step up task on 2x 3'' 1x 6'' step with no upper extremity assist and step through pattern with no LOB toward session end.    Rehab Potential Fair   PT Frequency 2x / week   PT Duration 4 weeks   PT Treatment/Interventions ADLs/Self Care Home Management;Aquatic Therapy;Biofeedback;Electrical Stimulation;Moist Heat;DME Instruction;Gait training;Stair training;Functional mobility training;Therapeutic activities;Balance training;Therapeutic exercise;Neuromuscular re-education;Patient/family education;Manual techniques;Passive range of motion;Orthotic Fit/Training   PT Next Visit Plan Progressing balance/ LE proprioception.  Increase L hip flexion/ stride through with gait.     PT Home Exercise Plan See pt instructions: bridge, SLR, sit<>stand, resisted DF, knee flexion. Lumbar/cervical spine mobility exercises.   Consulted and Agree with Plan of Care Patient;Family member/caregiver      Patient will benefit from skilled therapeutic intervention in order to improve the following deficits and impairments:  Abnormal gait, Decreased activity tolerance, Decreased balance, Decreased coordination, Decreased endurance, Decreased mobility, Decreased strength, Difficulty walking, Impaired sensation, Improper body mechanics, Postural dysfunction  Visit Diagnosis: Difficulty in walking, not elsewhere classified  Repeated falls  Muscle weakness (generalized)     Problem List Patient  Active Problem List   Diagnosis Date Noted  . Atypical chest pain 09/24/2015  . Elevated WBC count 05/05/2015  . 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 CVA (cerebral infarction) 04/06/2015  . Cerebral infarction ([place]) 04/06/2015  . Malignant neoplasm of prostate ([place]) 01/29/2015  . Cervical disc disease 12/03/2014  . Chest pain 11/25/2014  . Arthritis 08/20/2014  . Benign fibroma of prostate 08/20/2014  . Clinical depression 08/20/2014  . Failure of erection 08/20/2014  . Enlarged prostate 08/20/2014  . Major depressive disorder with single episode 08/20/2014  . 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  . Benign essential HTN 08/04/2014  . Acute low back pain 04/23/2014  . Alteration in bowel elimination: incontinence 04/23/2014  . Urge incontinence 04/23/2014  . 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 Benign essential tremor 12/23/2013  . B12 deficiency 08/13/2013  . Chronic kidney disease (CKD), stage  III (moderate) 08/13/2013  . Diabetes ([place]) 08/13/2013  . Hypoglycemia 08/13/2013  . Diabetes mellitus ([place]) 08/13/2013  . Type 2 diabetes mellitus ([place]) 08/13/2013  . Arteriosclerosis of coronary artery 07/24/2013  . Fatigue 07/24/2013  . Acid reflux 07/24/2013  . Combined fat and carbohydrate induced hyperlipemia 07/24/2013  . CAD in native artery 07/24/2013  . Gastro-esophageal reflux disease without esophagitis 07/24/2013  . 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 Subdural hematoma ([place]) 03/26/2013  . Traumatic subdural hemorrhage ([place]) 03/26/2013  . Abnormal prostate specific antigen 09/01/2012  . Benign prostatic hyperplasia with urinary obstruction 09/01/2012  . Elevated prostate specific antigen (PSA) 09/01/2012   Pura Spice, PT, DPT # [phone] Mickel Baas Lorelai Huyser SPT 12/13/2015, 6:05 PM  [place] [facility] [address] [place], [place], [address] Phone: [phone]   Fax:   [phone]  Name: Jeffri Canela MRN: [id number] Date of Birth: May 05, 1940

## 2015-12-15 ENCOUNTER — Encounter: Payer: Self-pay | Admitting: Physical Therapy

## 2015-12-15 ENCOUNTER — Ambulatory Visit: Payer: Medicare Other | Attending: Neurology | Admitting: Physical Therapy

## 2015-12-15 DIAGNOSIS — R296 Repeated falls: Secondary | ICD-10-CM | POA: Diagnosis present

## 2015-12-15 DIAGNOSIS — M6281 Muscle weakness (generalized): Secondary | ICD-10-CM | POA: Diagnosis present

## 2015-12-15 DIAGNOSIS — R262 Difficulty in walking, not elsewhere classified: Secondary | ICD-10-CM

## 2015-12-15 NOTE — Therapy (Signed)
[facility] [address] [place], [place], [address] Phone: [phone]   Fax:  [phone]  Physical Therapy Treatment  Patient Details  Name: Richard Chapman MRN: [id number] Date of Birth: 07-10-1940 Referring Provider: Vladimir Crofts MD  Encounter Date: 12/15/2015      PT End of Session - 12/15/15 1640    Visit Number 7   Number of Visits 8   Date for PT Re-Evaluation 12/20/15   Authorization - Visit Number 7   Authorization - Number of Visits 10   PT Start Time [id number]   PT Stop Time 1432   PT Time Calculation (min) 50 min   Equipment Utilized During Treatment Gait belt   Activity Tolerance Patient tolerated treatment well;Patient limited by fatigue   Behavior During Therapy [facility] for tasks assessed/performed      Past Medical History:  Diagnosis Date  . Anxiety and depression   . Depression   . Diabetes ([place])   . Elevated PSA   . GERD (gastroesophageal reflux disease)   . History of nephrolithiasis   . Hypertension   . Skin cancer   . Sleep apnea     Past Surgical History:  Procedure Laterality Date  . APPENDECTOMY    . BACK SURGERY    . KNEE SURGERY Bilateral   . NECK SURGERY    . SHOULDER SURGERY    . TONSILLECTOMY    . WRIST SURGERY      There were no vitals filed for this visit.      Subjective Assessment - 12/15/15 1639    Subjective Pt states that he has been doing more activity at home and feels that he has really benefitted from coming to physical therapy. States that he still feels a little unstable on his feet at times but it able to navigate his home environment with improved ease.   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 MRI c-spine: multilevel degenerative disc disease and post operative changes, some compression on spinal cord without cord  signal changes   Patient Stated Goals Increase LE muscle strength/ balance/ gait.    Currently in Pain? No/denies     Objective:  Therapeutic Exercise: Nu Step Level 6 10 minutes (warm up/no charge). Seated resisted hamstring curl with GTB 3x15 R/L. Seated LAQ with #2.5 ankle weights. Pt requiring frequent cueing for decreased speed during hamstring curl and proper technique for LAQ.  Neuromuscular Re-ed: With SPT CGA/min-mod A x1 for LOB: anterior cone taps R/L on agility ladder with forward and side stepping 63ft x 10. Pt with increased difficulty with R stance. 6'' stair tapping with LLE x30 without UE assist. In // bars with SPT CGA/SBA: modified grape vine forward cross 39ft x6, backward cross 29ft x6, grapevine 70ft x6 (beginning with UE, transition to unilateral UE assist, progressed to no UE assist). Air ex forward step 2x15. Air ex lateral step 2x10.   Pt response for medical necessity: Pt ambulating in PT clinic without SPC at this time with SPT CGA/SBA at all times. Pt with 1 major LOB during cone tapping exercise; requiring SPT min-mod A for balance correction. Pt with tendency to perform all exercises with increased speed without frequent cueing for safety.      PT Long Term Goals - 11/22/15 1748      PT LONG TERM GOAL #1  Title Pt will score >32/80 on LEFS to promote increase in functional mobility so he can be an active participant in daily tasks   Baseline 10/9: 25/80   Time 4   Period Weeks   Status New     PT LONG TERM GOAL #2   Title Pt will score >44/56 on BERG balance assessment to decrease fall risk   Baseline 10/9: 39/56   Time 4   Period Weeks   Status New     PT LONG TERM GOAL #3   Title Pt will ambulate with least restrictive assistive device for 8 minutes with effective gait strategy emphasizing foot clearance and promoting upright posture to decrease fall risk   Baseline pt with short, shuffling gait with SPC with no reciprocal strategy. states he can  walk for 2-3 minutes before requiring rest break   Time 4   Period Weeks   Status New     PT LONG TERM GOAL #4   Title Pt will perform continued in clinic exercise for >20 minutes without a seated rest break to promote increase in endurance so he can participate in family activities   Baseline pt limited in standing/exercise tolerance. currently no exercise plan   Time 4   Period Weeks   Status New           Plan - 12/15/15 1641    Clinical Impression Statement Pt with 1 LOB performing cone tapping drill on agility ladder outside // bars requiring SPT min-mod A to correct. Pt unphased by LOB, able to perform same activity with good success. Pt with increased difficulty with maintaining R SLS versus L SLS. Pt requires repeated cueing this session to decrease speed with therapeutic exercise and during balance tasks.Will reassess goals/progress next session to determine recert vs discharge.    Rehab Potential Fair   PT Frequency 2x / week   PT Duration 4 weeks   PT Treatment/Interventions ADLs/Self Care Home Management;Aquatic Therapy;Biofeedback;Electrical Stimulation;Moist Heat;DME Instruction;Gait training;Stair training;Functional mobility training;Therapeutic activities;Balance training;Therapeutic exercise;Neuromuscular re-education;Patient/family education;Manual techniques;Passive range of motion;Orthotic Fit/Training   PT Next Visit Plan Progressing balance/ LE proprioception.  Increase L hip flexion/ stride through with gait.     PT Home Exercise Plan See pt instructions: bridge, SLR, sit<>stand, resisted DF, knee flexion. Lumbar/cervical spine mobility exercises.   Consulted and Agree with Plan of Care Patient;Family member/caregiver      Patient will benefit from skilled therapeutic intervention in order to improve the following deficits and impairments:  Abnormal gait, Decreased activity tolerance, Decreased balance, Decreased coordination, Decreased endurance, Decreased  mobility, Decreased strength, Difficulty walking, Impaired sensation, Improper body mechanics, Postural dysfunction  Visit Diagnosis: Difficulty in walking, not elsewhere classified  Repeated falls  Muscle weakness (generalized)     Problem List Patient Active Problem List   Diagnosis Date Noted  . Atypical chest pain 09/24/2015  . Elevated WBC count 05/05/2015  . 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 CVA (cerebral infarction) 04/06/2015  . Cerebral infarction ([place]) 04/06/2015  . Malignant neoplasm of prostate ([place]) 01/29/2015  . Cervical disc disease 12/03/2014  . Chest pain 11/25/2014  . Arthritis 08/20/2014  . Benign fibroma of prostate 08/20/2014  . Clinical depression 08/20/2014  . Failure of erection 08/20/2014  . Enlarged prostate 08/20/2014  . Major depressive disorder with single episode 08/20/2014  . 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  . Benign essential HTN 08/04/2014  . Acute low back pain 04/23/2014  . Alteration in bowel elimination: incontinence 04/23/2014  .  Urge incontinence 04/23/2014  . 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 Benign essential tremor 12/23/2013  . B12 deficiency 08/13/2013  . Chronic kidney disease (CKD), stage III (moderate) 08/13/2013  . Diabetes ([place]) 08/13/2013  . Hypoglycemia 08/13/2013  . Diabetes mellitus ([place]) 08/13/2013  . Type 2 diabetes mellitus ([place]) 08/13/2013  . Arteriosclerosis of coronary artery 07/24/2013  . Fatigue 07/24/2013  . Acid reflux 07/24/2013  . Combined fat and carbohydrate induced hyperlipemia 07/24/2013  . CAD in native artery 07/24/2013  . Gastro-esophageal reflux disease without esophagitis 07/24/2013  . 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 Subdural hematoma ([place]) 03/26/2013  . Traumatic subdural hemorrhage ([place]) 03/26/2013  . Abnormal prostate specific antigen 09/01/2012  . Benign prostatic hyperplasia with urinary obstruction 09/01/2012  . Elevated  prostate specific antigen (PSA) 09/01/2012   Pura Spice, PT, DPT # [id number] Mickel Baas Farren Nelles SPT 12/15/2015, 4:50 PM  [facility] [address] [place], [place], [address] Phone: [phone]   Fax:  [phone]  Name: Zacchary Jarvinen MRN: [id number] Date of Birth: 1940/09/15

## 2015-12-20 ENCOUNTER — Ambulatory Visit: Payer: Medicare Other | Admitting: Physical Therapy

## 2015-12-20 ENCOUNTER — Encounter: Payer: Self-pay | Admitting: Physical Therapy

## 2015-12-20 DIAGNOSIS — R262 Difficulty in walking, not elsewhere classified: Secondary | ICD-10-CM | POA: Diagnosis not present

## 2015-12-20 DIAGNOSIS — R296 Repeated falls: Secondary | ICD-10-CM

## 2015-12-20 DIAGNOSIS — M6281 Muscle weakness (generalized): Secondary | ICD-10-CM

## 2015-12-20 NOTE — Therapy (Signed)
[facility] [address] [place], [place], [address] Phone: [phone]   Fax:  [phone]  Physical Therapy Treatment  Patient Details  Name: Richard Chapman MRN: [id number] Date of Birth: [birth year] Referring Provider: Vladimir Crofts MD  Encounter Date: 12/20/2015      PT End of Session - 12/20/15 1735    Visit Number 8   Number of Visits 16   Date for PT Re-Evaluation 01/19/16   PT Start Time 1301   PT Stop Time 1351   PT Time Calculation (min) 50 min   Equipment Utilized During Treatment Gait belt   Activity Tolerance Patient tolerated treatment well;Patient limited by fatigue   Behavior During Therapy [facility] for tasks assessed/performed      Past Medical History:  Diagnosis Date  . Anxiety and depression   . Depression   . Diabetes ([place])   . Elevated PSA   . GERD (gastroesophageal reflux disease)   . History of nephrolithiasis   . Hypertension   . Skin cancer   . Sleep apnea     Past Surgical History:  Procedure Laterality Date  . APPENDECTOMY    . BACK SURGERY    . KNEE SURGERY Bilateral   . NECK SURGERY    . SHOULDER SURGERY    . TONSILLECTOMY    . WRIST SURGERY      There were no vitals filed for this visit.      Subjective Assessment - 12/20/15 1733    Subjective Pt states that he is feeling like he might pass out when he arrives for session. States that his blood sugar was in the 200s this morning but states he has adjusted his insulin accordingly. Overall, pt states that he feels physical therapy has been helpful; wife notes that he is able to do more around the house.   Patient is accompained by: Family member  wife: janet   Limitations Walking;Standing;House hold activities   Diagnostic tests MRI brain: 12/09/15 Moderate white changes and multiple old lacunar strokes. MRI c-spine: multilevel degenerative disc disease and post operative changes, some compression on spinal cord without cord signal  changes   Currently in Pain? No/denies     Objective:  Vitals: O2 100%, HR 60, BP 151/69  Therapeutic Exercise: Seated hip abduction 2x15 bilateral LE with BTB. Seated hip adduction with pink ball bilateral LE 2x15. Pt demonstrates onset of R hip weakness during ambulation trial (3  minutes) with noticeable decrease in RLE stability; reliance on UE support with fatigue/pain. Sit<>stand with no UE support x5; cueing to control descent.  Neuromuscular Re-ed: In // bars with SPT CGA/SBA: Step up 2 3'' step, 1 6'' step with step to pattern and no UE support. Step up 2 3'' step, 1 6'' step with step through pattern and no UE support. Pt requires frequent cueing to decrease speed of exercise to maintain safety. Static balance with narrow base of support 1 minute. Forward reach task; pt able to achieve >10 inch with RUE. LE 6'' step taps R/L x30 ea. Static tandem stance with no UE assist; pt able to achieve balance for <10 seconds x8. Static single leg balance; pt able to achieve balance for <4 seconds on LLE.   Pt response for medical necessity: Pt requires several seated rest breaks during session due to onset of muscle fatigue with vitals WNL for entire session. He has demonstrated good progress with overall activity tolerance, balance and strength since his evaluation but continues  remains a fall risk with deficits in strength, balance and overall endurance. Pt will benefit from continued skilled PT to address deficits per goals.        PT Long Term Goals - 11/22/15 1748      PT LONG TERM GOAL #1   Title Pt will score >32/80 on LEFS to promote increase in functional mobility so he can be an active participant in daily tasks   Baseline 10/9: 25/80   Time 4   Period Weeks   Status New     PT LONG TERM GOAL #2   Title Pt will score >44/56 on BERG balance assessment to decrease fall risk   Baseline 10/9: 39/56   Time 4   Period Weeks   Status New     PT LONG TERM GOAL #3   Title Pt will  ambulate with least restrictive assistive device for 8 minutes with effective gait strategy emphasizing foot clearance and promoting upright posture to decrease fall risk   Baseline pt with short, shuffling gait with SPC with no reciprocal strategy. states he can walk for 2-3 minutes before requiring rest break   Time 4   Period Weeks   Status New     PT LONG TERM GOAL #4   Title Pt will perform continued in clinic exercise for >20 minutes without a seated rest break to promote increase in endurance so he can participate in family activities   Baseline pt limited in standing/exercise tolerance. currently no exercise plan   Time 4   Period Weeks   Status New               Plan - 12/20/15 1736    Clinical Impression Statement Pt arrives to PT session with initial feelings of light headedness but feels better following session; vitals WNL (O2 100, HR 60, BP 151/59). Pt has demonstrated solid improvement in overall balance and strength but continues to show limitations in balance tasks involving tandem stance or single leg stance. Pt also with moderate endurance deficits; demonstrates onset of fatigue after 3 1/2 minutes of walking with SPC with decreased RLE stability. Pt has shown good progress with physical therapy treatment thus far and will benefit from continued skilled tx to progress toward longterm functional mobility per pt goals.   Rehab Potential Fair   PT Frequency 2x / week   PT Duration 4 weeks   PT Treatment/Interventions ADLs/Self Care Home Management;Aquatic Therapy;Biofeedback;Electrical Stimulation;Moist Heat;DME Instruction;Gait training;Stair training;Functional mobility training;Therapeutic activities;Balance training;Therapeutic exercise;Neuromuscular re-education;Patient/family education;Manual techniques;Passive range of motion;Orthotic Fit/Training   PT Next Visit Plan Progressing balance/ LE proprioception.  Increase L hip flexion/ stride through with gait.     PT  Home Exercise Plan See pt instructions: bridge, SLR, sit<>stand, resisted DF, knee flexion. Lumbar/cervical spine mobility exercises.   Consulted and Agree with Plan of Care Patient;Family member/caregiver      Patient will benefit from skilled therapeutic intervention in order to improve the following deficits and impairments:  Abnormal gait, Decreased activity tolerance, Decreased balance, Decreased coordination, Decreased endurance, Decreased mobility, Decreased strength, Difficulty walking, Impaired sensation, Improper body mechanics, Postural dysfunction  Visit Diagnosis: Difficulty in walking, not elsewhere classified  Repeated falls  Muscle weakness (generalized)     Problem List Patient Active Problem List   Diagnosis Date Noted  . Atypical chest pain 09/24/2015  . Elevated WBC count 05/05/2015  . 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 CVA (cerebral infarction) 04/06/2015  . Cerebral infarction ([place]) 04/06/2015  . Malignant  neoplasm of prostate ([place]) 01/29/2015  . Cervical disc disease 12/03/2014  . Chest pain 11/25/2014  . Arthritis 08/20/2014  . Benign fibroma of prostate 08/20/2014  . Clinical depression 08/20/2014  . Failure of erection 08/20/2014  . Enlarged prostate 08/20/2014  . Major depressive disorder with single episode 08/20/2014  . 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  . Benign essential HTN 08/04/2014  . Acute low back pain 04/23/2014  . Alteration in bowel elimination: incontinence 04/23/2014  . Urge incontinence 04/23/2014  . 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 Benign essential tremor 12/23/2013  . B12 deficiency 08/13/2013  . Chronic kidney disease (CKD), stage III (moderate) 08/13/2013  . Diabetes ([place]) 08/13/2013  . Hypoglycemia 08/13/2013  . Diabetes mellitus ([place]) 08/13/2013  . Type 2 diabetes mellitus ([place]) 08/13/2013  . Arteriosclerosis of coronary artery 07/24/2013  . Fatigue 07/24/2013  . Acid reflux  07/24/2013  . Combined fat and carbohydrate induced hyperlipemia 07/24/2013  . CAD in native artery 07/24/2013  . Gastro-esophageal reflux disease without esophagitis 07/24/2013  . 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 Subdural hematoma ([place]) 03/26/2013  . Traumatic subdural hemorrhage ([place]) 03/26/2013  . Abnormal prostate specific antigen 09/01/2012  . Benign prostatic hyperplasia with urinary obstruction 09/01/2012  . Elevated prostate specific antigen (PSA) 09/01/2012   Pura Spice, PT, DPT # [id number] Mickel Baas Rionna Feltes SPT 12/20/2015, 5:48 PM  [facility] [address] [place], [place], [address] Phone: [phone]   Fax:  [phone]  Name: Lucky Garces MRN: [id number] Date of Birth: 1940-09-24

## 2015-12-22 ENCOUNTER — Ambulatory Visit: Payer: Medicare Other | Admitting: Physical Therapy

## 2015-12-22 ENCOUNTER — Encounter: Payer: Self-pay | Admitting: Physical Therapy

## 2015-12-22 DIAGNOSIS — R262 Difficulty in walking, not elsewhere classified: Secondary | ICD-10-CM

## 2015-12-22 DIAGNOSIS — R296 Repeated falls: Secondary | ICD-10-CM

## 2015-12-22 DIAGNOSIS — M6281 Muscle weakness (generalized): Secondary | ICD-10-CM

## 2015-12-22 NOTE — Therapy (Signed)
[facility] [address] [place], [place], [address] Phone: [phone]   Fax:  [phone]  Physical Therapy Treatment  Patient Details  Name: Richard Chapman MRN: [id number] Date of Birth: 08-16-1940 Referring Provider: Vladimir Crofts MD  Encounter Date: 12/22/2015      PT End of Session - 12/22/15 1738    Visit Number 9   Number of Visits 16   Date for PT Re-Evaluation 01/19/16   Authorization - Visit Number 9   Authorization - Number of Visits 17   PT Start Time 6962   PT Stop Time 1459   PT Time Calculation (min) 56 min   Equipment Utilized During Treatment Gait belt   Activity Tolerance Patient tolerated treatment well;Patient limited by fatigue   Behavior During Therapy [facility] for tasks assessed/performed      Past Medical History:  Diagnosis Date  . Anxiety and depression   . Depression   . Diabetes ([place])   . Elevated PSA   . GERD (gastroesophageal reflux disease)   . History of nephrolithiasis   . Hypertension   . Skin cancer   . Sleep apnea     Past Surgical History:  Procedure Laterality Date  . APPENDECTOMY    . BACK SURGERY    . KNEE SURGERY Bilateral   . NECK SURGERY    . SHOULDER SURGERY    . TONSILLECTOMY    . WRIST SURGERY      There were no vitals filed for this visit.      Subjective Assessment - 12/22/15 1737    Subjective Pt states that he is doing well today with no new c/o. He has been performing HEP regularly in his "man-cave" and states he is helping out more at home.   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 MRI c-spine: multilevel degenerative disc disease and post operative changes, some compression on spinal cord without cord signal changes   Patient Stated Goals Increase LE muscle strength/ balance/ gait.    Currently in Pain?  No/denies     Objective:  Nu Step 10 minutes Level 7 (no charge/cool down)  Neuromuscular Re-ed: On agility ladder with SPT CGA (requiring min A following x2 LOB): lateral walks with #2 medicine ball held outside BOS 95f x6, marching with #2 medicine ball held outside BOS 117fx4, backwards walking with #2 medicine ball 1241f4. Reciprocal anterior cone taps with lateral step on agility ladder 19f35f; pt demonstrates decreased stability with RLE as stance limb (quick ascent/descent of LLE). 3'' step taps with RLE as stance leg x30. In // bars with UE prn: LLE full range hip flexion/extension with RLE as stance limb 2x15. Lateral step up air ex pad with no UE assist (pt requires UE assist prn progressing toward no UE assist) x10.  Pt response for medical necessity: Pt requires several seated rest breaks during session due to fatigue; c/o of lateral hamstring tightness of LLE following multiple LLE AROM/RLE stance limb tasks. Pt states tightness/soreness abates when he sits down. Pt demonstrates increase in instability as he fatigues; will benefit from exercises aimed at increasing endurance to standing/stepping tasks.      PT Long Term Goals - 12/20/15 1748      PT LONG TERM GOAL #1   Title Pt will score >32/80 on LEFS to promote increase in functional mobility  so he can be an active participant in daily tasks   Baseline 10/9: 25/80. 11/6: 22/80 pt/wife but state that he is doing more activity with more ease in the home   Time 4   Period Weeks   Status Not Met     PT LONG TERM GOAL #2   Title Pt will score >44/56 on BERG balance assessment to decrease fall risk   Baseline 10/9: 39/56. 11/6: 49/56   Time 4   Period Weeks   Status Achieved     PT LONG TERM GOAL #3   Title Pt will ambulate with least restrictive assistive device for 8 minutes with effective gait strategy emphasizing foot clearance and promoting upright posture to decrease fall risk   Baseline pt with short, shuffling  gait with SPC with no reciprocal strategy. states he can walk for 2-3 minutes before requiring rest break. 11/6: pt requires seated rest break after 3 1/2 minute with onset of R hip pain and decreased RLE stability   Time 4   Period Weeks   Status Partially Met     PT LONG TERM GOAL #4   Title Pt will perform continued in clinic exercise for >20 minutes without a seated rest break to promote increase in endurance so he can participate in family activities   Baseline pt limited in standing/exercise tolerance. currently no exercise plan. 11/6: pt able to maintain standing exercise for >10 minutes   Time 4   Period Weeks   Status Partially Met     PT LONG TERM GOAL #5   Title Pt will score >53/56 on BERG balance assessment to decrease fall risk.   Baseline 11/6: 49/56   Time 4   Period Weeks   Status New               Plan - 12/22/15 1739    Clinical Impression Statement Pt with difficulty performing backwards walking task; marked decrease step length with LOB x2 on agility ladder. Pt continues to demonstrate considerable difficulty with RLE stance stability. He is able to perform multiple "cone taps" with good control with LLE as stance but demonstrates reliance on quick/uncontrolled ascent/descent of LLE with RLE as stance limb. Pt demonstrates decreased stability towards end of session as he fatigues.   Rehab Potential Fair   PT Frequency 2x / week   PT Duration 4 weeks   PT Treatment/Interventions ADLs/Self Care Home Management;Aquatic Therapy;Biofeedback;Electrical Stimulation;Moist Heat;DME Instruction;Gait training;Stair training;Functional mobility training;Therapeutic activities;Balance training;Therapeutic exercise;Neuromuscular re-education;Patient/family education;Manual techniques;Passive range of motion;Orthotic Fit/Training   PT Next Visit Plan Endurance. Progressing balance/ LE proprioception.  Increase L hip flexion/ stride through with gait.     PT Home Exercise Plan  See pt instructions: bridge, SLR, sit<>stand, resisted DF, knee flexion. Lumbar/cervical spine mobility exercises.   Consulted and Agree with Plan of Care Patient;Family member/caregiver      Patient will benefit from skilled therapeutic intervention in order to improve the following deficits and impairments:  Abnormal gait, Decreased activity tolerance, Decreased balance, Decreased coordination, Decreased endurance, Decreased mobility, Decreased strength, Difficulty walking, Impaired sensation, Improper body mechanics, Postural dysfunction  Visit Diagnosis: Difficulty in walking, not elsewhere classified  Repeated falls  Muscle weakness (generalized)     Problem List Patient Active Problem List   Diagnosis Date Noted  . Atypical chest pain 09/24/2015  . Elevated WBC count 05/05/2015  . 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 CVA (cerebral infarction) 04/06/2015  . Cerebral infarction ([employer]) 04/06/2015  . Malignant neoplasm  of prostate ([place]) 01/29/2015  . Cervical disc disease 12/03/2014  . Chest pain 11/25/2014  . Arthritis 08/20/2014  . Benign fibroma of prostate 08/20/2014  . Clinical depression 08/20/2014  . Failure of erection 08/20/2014  . Enlarged prostate 08/20/2014  . Major depressive disorder with single episode 08/20/2014  . 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  . Benign essential HTN 08/04/2014  . Acute low back pain 04/23/2014  . Alteration in bowel elimination: incontinence 04/23/2014  . Urge incontinence 04/23/2014  . 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 Benign essential tremor 12/23/2013  . B12 deficiency 08/13/2013  . Chronic kidney disease (CKD), stage III (moderate) 08/13/2013  . Diabetes ([place]) 08/13/2013  . Hypoglycemia 08/13/2013  . Diabetes mellitus ([place]) 08/13/2013  . Type 2 diabetes mellitus ([place]) 08/13/2013  . Arteriosclerosis of coronary artery 07/24/2013  . Fatigue 07/24/2013  . Acid reflux 07/24/2013  . Combined  fat and carbohydrate induced hyperlipemia 07/24/2013  . CAD in native artery 07/24/2013  . Gastro-esophageal reflux disease without esophagitis 07/24/2013  . 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 Subdural hematoma ([place]) 03/26/2013  . Traumatic subdural hemorrhage ([place]) 03/26/2013  . Abnormal prostate specific antigen 09/01/2012  . Benign prostatic hyperplasia with urinary obstruction 09/01/2012  . Elevated prostate specific antigen (PSA) 09/01/2012   Pura Spice, PT, DPT # [id number] Mickel Baas Ron Beske SPT 12/22/2015, 5:48 PM  [facility] [address] [place], [place], [address] Phone: [phone]   Fax:  [phone]  Name: Richard Chapman MRN: [id number] Date of Birth: October 23, 1940

## 2015-12-27 ENCOUNTER — Ambulatory Visit: Payer: Medicare Other | Admitting: Physical Therapy

## 2015-12-29 ENCOUNTER — Ambulatory Visit: Payer: Medicare Other | Admitting: Physical Therapy

## 2015-12-29 ENCOUNTER — Encounter: Payer: Self-pay | Admitting: Physical Therapy

## 2015-12-29 DIAGNOSIS — M6281 Muscle weakness (generalized): Secondary | ICD-10-CM

## 2015-12-29 DIAGNOSIS — R296 Repeated falls: Secondary | ICD-10-CM

## 2015-12-29 DIAGNOSIS — R262 Difficulty in walking, not elsewhere classified: Secondary | ICD-10-CM | POA: Diagnosis not present

## 2015-12-29 NOTE — Therapy (Signed)
[facility] [address] [place], [place], [address] Phone: [phone]   Fax:  [phone]  Physical Therapy Treatment  Patient Details  Name: Richard Chapman MRN: [id number] Date of Birth: 06-26-1940 Referring Provider: Vladimir Crofts MD  Encounter Date: 12/29/2015      PT End of Session - 12/29/15 1727    Visit Number 10   Number of Visits 16   Date for PT Re-Evaluation 01/19/16   Authorization - Visit Number 10   Authorization - Number of Visits 17   PT Start Time 8115   PT Stop Time 1518   PT Time Calculation (min) 51 min   Equipment Utilized During Treatment Gait belt   Activity Tolerance Patient tolerated treatment well;Patient limited by fatigue   Behavior During Therapy [facility] for tasks assessed/performed      Past Medical History:  Diagnosis Date  . Anxiety and depression   . Depression   . Diabetes ([place])   . Elevated PSA   . GERD (gastroesophageal reflux disease)   . History of nephrolithiasis   . Hypertension   . Skin cancer   . Sleep apnea     Past Surgical History:  Procedure Laterality Date  . APPENDECTOMY    . BACK SURGERY    . KNEE SURGERY Bilateral   . NECK SURGERY    . SHOULDER SURGERY    . TONSILLECTOMY    . WRIST SURGERY      There were no vitals filed for this visit.      Subjective Assessment - 12/29/15 1725    Subjective Pt states that he has one stumble when he was walking out of his garage; states that he was with his son and did not experience a fall. Pt states that he didn't feel well earlier this week but states he is doing much better today.   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 MRI c-spine: multilevel degenerative disc disease and post operative changes, some compression on spinal cord without cord signal changes   Patient  Stated Goals Increase LE muscle strength/ balance/ gait.    Currently in Pain? No/denies     Objective:  Nu Step 10 minutes Level 5 (warm up/no charge).   Neuromuscular Re-ed: With SPT CGA: Ambulation on even surfaces in therapy clinic without AD 4 minutes. 3'' step taps with alternating LE 2.5 minutes x2. Sit<>stand with #2 weighted ball and air ex cushion to increase height of clinic chair 1 minute x3. In // bars with UE assist prn 2x 3'' step up/down x6 (pt able to perform step to and step over pattern with ascend of 3'' steps). In // bars with UE assist 3'' step up and 6'' step up x6 (pt able to perform step to pattern with no UE assist on 6'' step after repeated practice); requires cueing to maintain slow/steady pace during exercise. When pt hurries he has tendency to stumble and requires min-modA to correct. Static stance on air ex pad with weighted ball diagonals in UE 1 minute x3.  Pt response for medical necessity: Pt with good performance overall during physical therapy session. Complains of mild pain in lateral hamstring with repeated sit<>stand exercise; pain resolves after <30 sec rest break. Pt demonstrates most difficulty with foot clearance during stepping tasks this session; with tendency to catch LLE on step.  PT Long Term Goals - 12/20/15 1748      PT LONG TERM GOAL #1   Title Pt will score >32/80 on LEFS to promote increase in functional mobility so he can be an active participant in daily tasks   Baseline 10/9: 25/80. 11/6: 22/80 pt/wife but state that he is doing more activity with more ease in the home   Time 4   Period Weeks   Status Not Met     PT LONG TERM GOAL #2   Title Pt will score >44/56 on BERG balance assessment to decrease fall risk   Baseline 10/9: 39/56. 11/6: 49/56   Time 4   Period Weeks   Status Achieved     PT LONG TERM GOAL #3   Title Pt will ambulate with least restrictive assistive device for 8 minutes with effective gait strategy  emphasizing foot clearance and promoting upright posture to decrease fall risk   Baseline pt with short, shuffling gait with SPC with no reciprocal strategy. states he can walk for 2-3 minutes before requiring rest break. 11/6: pt requires seated rest break after 3 1/2 minute with onset of R hip pain and decreased RLE stability   Time 4   Period Weeks   Status Partially Met     PT LONG TERM GOAL #4   Title Pt will perform continued in clinic exercise for >20 minutes without a seated rest break to promote increase in endurance so he can participate in family activities   Baseline pt limited in standing/exercise tolerance. currently no exercise plan. 11/6: pt able to maintain standing exercise for >10 minutes   Time 4   Period Weeks   Status Partially Met     PT LONG TERM GOAL #5   Title Pt will score >53/56 on BERG balance assessment to decrease fall risk.   Baseline 11/6: 49/56   Time 4   Period Weeks   Status New               Plan - 12/29/15 1727    Clinical Impression Statement Pt demonstrates difficulty with foot clearance during stepping tasks and catches his left foot underneath 3'' step x5 this session; requires min A for balance assist x1. Pt will benefit from continued emphasis on LE endurance and dynamic balance tasks.    Rehab Potential Good   PT Frequency 2x / week   PT Duration 4 weeks   PT Treatment/Interventions ADLs/Self Care Home Management;Aquatic Therapy;Biofeedback;Electrical Stimulation;Moist Heat;DME Instruction;Gait training;Stair training;Functional mobility training;Therapeutic activities;Balance training;Therapeutic exercise;Neuromuscular re-education;Patient/family education;Manual techniques;Passive range of motion;Orthotic Fit/Training   PT Next Visit Plan Endurance. Progressing balance/ LE proprioception. balance with RLE as stance limb.   PT Home Exercise Plan See pt instructions: bridge, SLR, sit<>stand, resisted DF, knee flexion. Lumbar/cervical  spine mobility exercises.   Consulted and Agree with Plan of Care Patient;Family member/caregiver      Patient will benefit from skilled therapeutic intervention in order to improve the following deficits and impairments:  Abnormal gait, Decreased activity tolerance, Decreased balance, Decreased coordination, Decreased endurance, Decreased mobility, Decreased strength, Difficulty walking, Impaired sensation, Improper body mechanics, Postural dysfunction  Visit Diagnosis: Difficulty in walking, not elsewhere classified  Repeated falls  Muscle weakness (generalized)     Problem List Patient Active Problem List   Diagnosis Date Noted  . Atypical chest pain 09/24/2015  . Elevated WBC count 05/05/2015  . 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 CVA (cerebral infarction) 04/06/2015  . Cerebral infarction ([place]) 04/06/2015  .  Malignant neoplasm of prostate ([facility]) 01/29/2015  . Cervical disc disease 12/03/2014  . Chest pain 11/25/2014  . Arthritis 08/20/2014  . Benign fibroma of prostate 08/20/2014  . Clinical depression 08/20/2014  . Failure of erection 08/20/2014  . Enlarged prostate 08/20/2014  . Major depressive disorder with single episode 08/20/2014  . 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  . Benign essential HTN 08/04/2014  . Acute low back pain 04/23/2014  . Alteration in bowel elimination: incontinence 04/23/2014  . Urge incontinence 04/23/2014  . 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 Benign essential tremor 12/23/2013  . B12 deficiency 08/13/2013  . Chronic kidney disease (CKD), stage III (moderate) 08/13/2013  . Diabetes ([facility]) 08/13/2013  . Hypoglycemia 08/13/2013  . Diabetes mellitus ([facility]) 08/13/2013  . Type 2 diabetes mellitus ([facility]) 08/13/2013  . Arteriosclerosis of coronary artery 07/24/2013  . Fatigue 07/24/2013  . Acid reflux 07/24/2013  . Combined fat and carbohydrate induced hyperlipemia 07/24/2013  . CAD in native artery 07/24/2013   . Gastro-esophageal reflux disease without esophagitis 07/24/2013  . Intracranial subdural hematoma ([facility]) 03/26/2013  . Subdural hematoma ([facility]) 03/26/2013  . Traumatic subdural hemorrhage ([facility]) 03/26/2013  . Abnormal prostate specific antigen 09/01/2012  . Benign prostatic hyperplasia with urinary obstruction 09/01/2012  . Elevated prostate specific antigen (PSA) 09/01/2012   Michael C Sherk, PT, DPT # [id number]   SPT 12/29/2015, 5:39 PM  [facility] [address] [place], [place], [address] Phone: [phone]   Fax:  [phone]  Name: Richard Chapman MRN: [id number] Date of Birth: 10/24/1940    

## 2016-01-04 ENCOUNTER — Ambulatory Visit: Payer: Medicare Other

## 2016-01-04 ENCOUNTER — Other Ambulatory Visit: Payer: Self-pay

## 2016-01-04 DIAGNOSIS — R262 Difficulty in walking, not elsewhere classified: Secondary | ICD-10-CM

## 2016-01-04 DIAGNOSIS — C61 Malignant neoplasm of prostate: Secondary | ICD-10-CM

## 2016-01-04 NOTE — Therapy (Signed)
[facility] [address] [place], [place], [address] Phone: [phone]   Fax:  [phone]  Patient Details  Name: Richard Chapman MRN: [id number] Date of Birth: 11/05/1940 Referring Provider:  Vladimir Crofts, MD  Encounter Date: 01/04/2016  Pt arrived and while in waiting room stated he was feeling unwell. He went to the bathroom and when he came out reported he was having nausea. PT assessed pt who reports acute onset of nausea after arriving. Denies history of chills, fevers, nausea, or vomiting recently. Reports some mid sternal chest pain which is consistent with her GERD and/or angina pain. Vitals obtained and are WNL. Pt is a diabetic and replaced his 24 hour insulin this morning. Pt does not have a glucometer and POCT not available at this location. Pt offerred hard candy but refuses at this time (no glucose tabs available). Pt is not diaphoretic or dyspneic. Reports chest pain as 3/10. Pt encouraged to check his blood sugar after arriving home. Call 911 or go to ER if chest pain worsens. Pt and wife in agreement.  Phillips Grout PT, DPT   Damika Harmon 01/04/2016, 2:43 PM  [facility] [address] [place], [place], [address] Phone: [phone]   Fax:  [phone]

## 2016-01-10 ENCOUNTER — Ambulatory Visit: Payer: Medicare Other | Admitting: Physical Therapy

## 2016-01-10 DIAGNOSIS — R262 Difficulty in walking, not elsewhere classified: Secondary | ICD-10-CM | POA: Diagnosis not present

## 2016-01-10 DIAGNOSIS — R296 Repeated falls: Secondary | ICD-10-CM

## 2016-01-10 DIAGNOSIS — M6281 Muscle weakness (generalized): Secondary | ICD-10-CM

## 2016-01-10 NOTE — Therapy (Signed)
[facility] [address] [place], [place], [address] Phone: [phone]   Fax:  [phone]  Physical Therapy Treatment  Patient Details  Name: Richard Chapman MRN: [id number] Date of Birth: 08/10/1940 Referring Provider: Vladimir Crofts MD  Encounter Date: 01/10/2016      PT End of Session - 01/10/16 1438    Visit Number 11   Number of Visits 16   Date for PT Re-Evaluation 01/19/16   Authorization - Visit Number 11   Authorization - Number of Visits 17   PT Start Time 7373   PT Stop Time 1522   PT Time Calculation (min) 50 min   Equipment Utilized During Treatment Gait belt   Activity Tolerance Patient tolerated treatment well;Patient limited by fatigue   Behavior During Therapy [facility] for tasks assessed/performed      Past Medical History:  Diagnosis Date  . Anxiety and depression   . Depression   . Diabetes ([place])   . Elevated PSA   . GERD (gastroesophageal reflux disease)   . History of nephrolithiasis   . Hypertension   . Skin cancer   . Sleep apnea     Past Surgical History:  Procedure Laterality Date  . APPENDECTOMY    . BACK SURGERY    . KNEE SURGERY Bilateral   . NECK SURGERY    . SHOULDER SURGERY    . TONSILLECTOMY    . WRIST SURGERY      There were no vitals filed for this visit.        Subjective Assessment - 01/10/16 1446    Subjective Pt. states he is feeling better as compared to last tx. session.  No falls reported.     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 MRI c-spine: multilevel degenerative disc disease and post operative changes, some compression on spinal cord without cord signal changes   Patient Stated Goals Increase LE muscle strength/ balance/ gait.    Currently in Pain? No/denies     Objective:  Nu Step 10 minutes Level 6 with no  rest breaks (warm up/no charge).   There.ex.:  3# ankle wt. Seated ex. (LAQ/ marching/ heel raises)- 20x.  Standing hip ex./ walking in //-bars with ankle wts. And no hands.  Sit to stand 10x with min. To no UE assist (cuing for proper technique).    Neuromuscular Re-ed:  CGA:  Ambulation on even surfaces in therapy clinic without AD 6 minutes.  3'' step taps with alternating LE 3 minutes x 2 (moderate LE fatigue and increase back discomfort).  Sit<>stand with #2 weighted ball and air ex cushion to increase height of clinic chair 1 minute x3. In // bars with UE assist prn 2x 3'' step up/down x6 (pt able to perform step to and step over pattern with ascend of 3'' steps). In // bars with UE assist 3'' step up and 6'' step up x6 (pt able to perform step to pattern with no UE assist on 6'' step after repeated practice); requires cueing to maintain slow/steady pace during exercise. When pt hurries he has tendency to stumble and requires min-modA to correct. Static stance on air ex pad with weighted ball diagonals in UE 1 minute x3.  Pt response for medical necessity: Pt with good performance overall during physical therapy session. Complains of mild back pain with prolonged standing exercise; pain  resolves after <30 sec rest break. Pt demonstrates most difficulty with foot clearance during stepping tasks this session; with tendency to catch LLE on step.       PT Long Term Goals - 12/20/15 1748      PT LONG TERM GOAL #1   Title Pt will score >32/80 on LEFS to promote increase in functional mobility so he can be an active participant in daily tasks   Baseline 10/9: 25/80. 11/6: 22/80 pt/wife but state that he is doing more activity with more ease in the home   Time 4   Period Weeks   Status Not Met     PT LONG TERM GOAL #2   Title Pt will score >44/56 on BERG balance assessment to decrease fall risk   Baseline 10/9: 39/56. 11/6: 49/56   Time 4   Period Weeks   Status Achieved     PT LONG TERM  GOAL #3   Title Pt will ambulate with least restrictive assistive device for 8 minutes with effective gait strategy emphasizing foot clearance and promoting upright posture to decrease fall risk   Baseline pt with short, shuffling gait with SPC with no reciprocal strategy. states he can walk for 2-3 minutes before requiring rest break. 11/6: pt requires seated rest break after 3 1/2 minute with onset of R hip pain and decreased RLE stability   Time 4   Period Weeks   Status Partially Met     PT LONG TERM GOAL #4   Title Pt will perform continued in clinic exercise for >20 minutes without a seated rest break to promote increase in endurance so he can participate in family activities   Baseline pt limited in standing/exercise tolerance. currently no exercise plan. 11/6: pt able to maintain standing exercise for >10 minutes   Time 4   Period Weeks   Status Partially Met     PT LONG TERM GOAL #5   Title Pt will score >53/56 on BERG balance assessment to decrease fall risk.   Baseline 11/6: 49/56   Time 4   Period Weeks   Status New           Plan - 01/10/16 1439    Clinical Impression Statement Pt. ambulates with inconsistent foot clearance/ step pattern with walking around PT clinic.  CGA to min. A for safety and verbal cuing for safety with stairs/ step ups.  Several short seated rest breaks required due to c/o low back stiffness/ fatigue/ pain.     Rehab Potential Good   PT Frequency 2x / week   PT Duration 4 weeks   PT Treatment/Interventions ADLs/Self Care Home Management;Aquatic Therapy;Biofeedback;Electrical Stimulation;Moist Heat;DME Instruction;Gait training;Stair training;Functional mobility training;Therapeutic activities;Balance training;Therapeutic exercise;Neuromuscular re-education;Patient/family education;Manual techniques;Passive range of motion;Orthotic Fit/Training   PT Next Visit Plan Endurance. Progressing balance/ LE proprioception. balance with RLE as stance limb.    PT Home Exercise Plan See pt instructions: bridge, SLR, sit<>stand, resisted DF, knee flexion. Lumbar/cervical spine mobility exercises.   Consulted and Agree with Plan of Care Patient;Family member/caregiver      Patient will benefit from skilled therapeutic intervention in order to improve the following deficits and impairments:  Abnormal gait, Decreased activity tolerance, Decreased balance, Decreased coordination, Decreased endurance, Decreased mobility, Decreased strength, Difficulty walking, Impaired sensation, Improper body mechanics, Postural dysfunction  Visit Diagnosis: Difficulty in walking, not elsewhere classified  Repeated falls  Muscle weakness (generalized)     Problem List Patient Active Problem List   Diagnosis Date Noted  .  Atypical chest pain 09/24/2015  . Elevated WBC count 05/05/2015  . 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 CVA (cerebral infarction) 04/06/2015  . Cerebral infarction ([place]) 04/06/2015  . Malignant neoplasm of prostate ([place]) 01/29/2015  . Cervical disc disease 12/03/2014  . Chest pain 11/25/2014  . Arthritis 08/20/2014  . Benign fibroma of prostate 08/20/2014  . Clinical depression 08/20/2014  . Failure of erection 08/20/2014  . Enlarged prostate 08/20/2014  . Major depressive disorder with single episode 08/20/2014  . 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  . Benign essential HTN 08/04/2014  . Acute low back pain 04/23/2014  . Alteration in bowel elimination: incontinence 04/23/2014  . Urge incontinence 04/23/2014  . 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 Benign essential tremor 12/23/2013  . B12 deficiency 08/13/2013  . Chronic kidney disease (CKD), stage III (moderate) 08/13/2013  . Diabetes ([place]) 08/13/2013  . Hypoglycemia 08/13/2013  . Diabetes mellitus ([place]) 08/13/2013  . Type 2 diabetes mellitus ([place]) 08/13/2013  . Arteriosclerosis of coronary artery 07/24/2013  . Fatigue 07/24/2013  . Acid  reflux 07/24/2013  . Combined fat and carbohydrate induced hyperlipemia 07/24/2013  . CAD in native artery 07/24/2013  . Gastro-esophageal reflux disease without esophagitis 07/24/2013  . 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 Subdural hematoma ([place]) 03/26/2013  . Traumatic subdural hemorrhage ([place]) 03/26/2013  . Abnormal prostate specific antigen 09/01/2012  . Benign prostatic hyperplasia with urinary obstruction 09/01/2012  . Elevated prostate specific antigen (PSA) 09/01/2012   Pura Spice, PT, DPT # [phone] 01/10/2016, 3:00 PM  [facility] [address] [place], [place], [address] Phone: [phone]   Fax:  [phone]  Name: Richard Chapman MRN: [id number] Date of Birth: 25-Nov-1940

## 2016-01-12 ENCOUNTER — Ambulatory Visit: Payer: Medicare Other | Admitting: Physical Therapy

## 2016-01-12 DIAGNOSIS — R262 Difficulty in walking, not elsewhere classified: Secondary | ICD-10-CM

## 2016-01-12 DIAGNOSIS — R296 Repeated falls: Secondary | ICD-10-CM

## 2016-01-12 DIAGNOSIS — M6281 Muscle weakness (generalized): Secondary | ICD-10-CM

## 2016-01-12 NOTE — Therapy (Signed)
[facility] [address] [place], [place], [address] Phone: [phone]   Fax:  [phone]  Physical Therapy Treatment  Patient Details  Name: Richard Chapman MRN: [id number] Date of Birth: February 16, 1940 Referring Provider: Vladimir Crofts MD  Encounter Date: 01/12/2016      PT End of Session - 01/12/16 1423    Visit Number 12   Number of Visits 16   Date for PT Re-Evaluation 01/19/16   Authorization - Visit Number 12   Authorization - Number of Visits 17   PT Start Time 0539   PT Stop Time 1521   PT Time Calculation (min) 57 min   Equipment Utilized During Treatment Gait belt   Activity Tolerance Patient tolerated treatment well;Patient limited by fatigue   Behavior During Therapy [facility] for tasks assessed/performed      Past Medical History:  Diagnosis Date  . Anxiety and depression   . Depression   . Diabetes ([place])   . Elevated PSA   . GERD (gastroesophageal reflux disease)   . History of nephrolithiasis   . Hypertension   . Skin cancer   . Sleep apnea     Past Surgical History:  Procedure Laterality Date  . APPENDECTOMY    . BACK SURGERY    . KNEE SURGERY Bilateral   . NECK SURGERY    . SHOULDER SURGERY    . TONSILLECTOMY    . WRIST SURGERY      There were no vitals filed for this visit.      Subjective Assessment - 01/12/16 1423    Subjective Pt. reports he is tired but no new complaints.  Pt. reports a LOB yesterday (able to self-correct) but no falls reported.  Pt. states that if he is discharged today he will stay active and complete HEP with wife's assist.    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 MRI c-spine: multilevel degenerative disc disease and post operative changes, some compression on spinal cord without cord signal changes   Patient Stated Goals  Increase LE muscle strength/ balance/ gait.    Currently in Pain? No/denies      Objective:  Nu Step 10 minutes Level 7 with no rest breaks (warm up/no charge).   There.ex.: Seated/standing ex. (LAQ/ marching/ heel raises)- 20x.  Sit to stand 10x with min. To no UE assist (cuing for proper technique).  Reviewed/ discussed HEP in depth with pt. And wife.    Neuromuscular Re-ed:  CGA:  Ambulation on even surfaces in therapy clinic without AD 6 minutes.  3'' step taps with alternating LE 3 minutes x 2 (moderate LE fatigue reported).  Sit<>stand 7x2; requires cueing to maintain slow/steady pace during exercise. When pt hurries he has tendency to stumble and requires min-modA to correct. Static stance on air ex pad with weighted ball diagonals in UE 1 minute x3.  Step ups/overs on Airex in //-bars with no UE assist (CGA for safety/cuing).  Gait: amb. In hallway/ clinic with and without use of [place] working on recip. Step pattern/ consistent cadence with hip/knee flexion and heel strike.  Occasional cuing to increase L hip flexion during swing through phase of gait.   Pt response for medical necessity: Pt with good performance overall during physical therapy session. Pt. And wife understand HEP and importance of maintaining an active lifestyle.  Discharge from PT at  this time.        PT Long Term Goals - January 18, 2016 1524      PT LONG TERM GOAL #1   Title Pt will score >32/80 on LEFS to promote increase in functional mobility so he can be an active participant in daily tasks   Baseline 10/9: 25/80. 11/6: 22/80 pt/wife but state that he is doing more activity with more ease in the home.  28 out of 80 on 01/18/2023   Time 4   Period Weeks   Status Partially Met     PT LONG TERM GOAL #2   Title Pt will score >44/56 on BERG balance assessment to decrease fall risk   Baseline 10/9: 39/56. 11/6: 49/56   Time 4   Period Weeks   Status Achieved     PT LONG TERM GOAL #3   Title Pt will ambulate with  least restrictive assistive device for 8 minutes with effective gait strategy emphasizing foot clearance and promoting upright posture to decrease fall risk   Baseline Ambulates with SPC on all surfaces safety.  Pt. able to amb. short distances with no assistive device but occasional cuing to increase L hip/knee flexion to prevent scuffing foot.    Time 4   Period Weeks   Status Partially Met     PT LONG TERM GOAL #4   Title Pt will perform continued in clinic exercise for >20 minutes without a seated rest break to promote increase in endurance so he can participate in family activities   Time 4   Period Weeks   Status Achieved     PT LONG TERM GOAL #5   Title Pt will score >53/56 on BERG balance assessment to decrease fall risk.   Baseline 11/6: 49/56.  2016/01/18 49/56 on Merrilee Jansky   Time 4   Period Weeks   Status Partially Met               Plan - 18-Jan-2016 1424    Clinical Impression Statement Pt. has worked hard with PT over past month and has shown slow but consistent gains in B LE muscle strengthening/ balance tasks.  Marked increase in [place] since initial evaluation.  B LE muscle strength grossly 5/5 MMT except hip flexion 4/5 MMT (fatigue noted).  Pt. will be discharged at this time with focus on a daily walking/ independent walking program with wife assist.  Pt. instructed to contact PT if any further problems in future.     Rehab Potential Good   PT Frequency 2x / week   PT Duration 4 weeks   PT Treatment/Interventions ADLs/Self Care Home Management;Aquatic Therapy;Biofeedback;Electrical Stimulation;Moist Heat;DME Instruction;Gait training;Stair training;Functional mobility training;Therapeutic activities;Balance training;Therapeutic exercise;Neuromuscular re-education;Patient/family education;Manual techniques;Passive range of motion;Orthotic Fit/Training   PT Next Visit Plan Discharge from PT at this time.    PT Home Exercise Plan See pt instructions: bridge, SLR, sit<>stand,  resisted DF, knee flexion. Lumbar/cervical spine mobility exercises.   Consulted and Agree with Plan of Care Patient;Family member/caregiver      Patient will benefit from skilled therapeutic intervention in order to improve the following deficits and impairments:  Abnormal gait, Decreased activity tolerance, Decreased balance, Decreased coordination, Decreased endurance, Decreased mobility, Decreased strength, Difficulty walking, Impaired sensation, Improper body mechanics, Postural dysfunction  Visit Diagnosis: Difficulty in walking, not elsewhere classified  Repeated falls  Muscle weakness (generalized)       G-Codes - 01-18-16 1526    Functional Assessment Tool Used Clinical impression/ LEFS/ [place]/ muscle weakness  Functional Limitation Mobility: Walking and moving around   Mobility: Walking and Moving Around Current Status [phone]) At least 20 percent but less than 40 percent impaired, limited or restricted   Mobility: Walking and Moving Around Goal Status [phone]) At least 20 percent but less than 40 percent impaired, limited or restricted   Mobility: Walking and Moving Around Discharge Status [phone]) At least 20 percent but less than 40 percent impaired, limited or restricted      Problem List Patient Active Problem List   Diagnosis Date Noted  . Atypical chest pain 09/24/2015  . Elevated WBC count 05/05/2015  . 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 CVA (cerebral infarction) 04/06/2015  . Cerebral infarction ([place]) 04/06/2015  . Malignant neoplasm of prostate ([place]) 01/29/2015  . Cervical disc disease 12/03/2014  . Chest pain 11/25/2014  . Arthritis 08/20/2014  . Benign fibroma of prostate 08/20/2014  . Clinical depression 08/20/2014  . Failure of erection 08/20/2014  . Enlarged prostate 08/20/2014  . Major depressive disorder with single episode 08/20/2014  . 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  . Benign essential HTN 08/04/2014  .  Acute low back pain 04/23/2014  . Alteration in bowel elimination: incontinence 04/23/2014  . Urge incontinence 04/23/2014  . 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 Benign essential tremor 12/23/2013  . B12 deficiency 08/13/2013  . Chronic kidney disease (CKD), stage III (moderate) 08/13/2013  . Diabetes ([place]) 08/13/2013  . Hypoglycemia 08/13/2013  . Diabetes mellitus ([place]) 08/13/2013  . Type 2 diabetes mellitus ([place]) 08/13/2013  . Arteriosclerosis of coronary artery 07/24/2013  . Fatigue 07/24/2013  . Acid reflux 07/24/2013  . Combined fat and carbohydrate induced hyperlipemia 07/24/2013  . CAD in native artery 07/24/2013  . Gastro-esophageal reflux disease without esophagitis 07/24/2013  . 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 Subdural hematoma ([place]) 03/26/2013  . Traumatic subdural hemorrhage ([place]) 03/26/2013  . Abnormal prostate specific antigen 09/01/2012  . Benign prostatic hyperplasia with urinary obstruction 09/01/2012  . Elevated prostate specific antigen (PSA) 09/01/2012   Pura Spice, PT, DPT # [phone]  01/12/2016, 3:28 PM  [facility] [address] [place], [place], [address] Phone: [phone]   Fax:  [phone]  Name: Richard Chapman MRN: [id number] Date of Birth: 01-22-1941

## 2016-01-26 ENCOUNTER — Ambulatory Visit (HOSPITAL_COMMUNITY)
Admission: RE | Admit: 2016-01-26 | Discharge: 2016-01-26 | Disposition: A | Discharge status: Home / Self Care | Payer: Medicare Other | Source: Ambulatory Visit | Attending: Urology | Admitting: Urology

## 2016-01-26 DIAGNOSIS — C61 Malignant neoplasm of prostate: Secondary | ICD-10-CM

## 2016-01-29 ENCOUNTER — Ambulatory Visit (HOSPITAL_COMMUNITY)
Admission: RE | Admit: 2016-01-29 | Discharge: 2016-01-29 | Disposition: A | Discharge status: Home / Self Care | Payer: Medicare Other | Source: Ambulatory Visit | Attending: Urology | Admitting: Urology

## 2016-01-29 MED ORDER — LIDOCAINE HCL 2 % EX GEL
CUTANEOUS | Status: AC
Start: 2016-01-29 — End: 2016-01-29
  Filled 2016-01-29: qty 30

## 2016-01-29 MED ORDER — LIDOCAINE HCL 2 % EX GEL: 1.0000 "application " | Freq: Once | CUTANEOUS | Status: DC

## 2016-01-29 MED ORDER — GADOBENATE DIMEGLUMINE 529 MG/ML IV SOLN: 17.0000 mL | Freq: Once | INTRAVENOUS | Status: DC | PRN

## 2016-02-15 ENCOUNTER — Telehealth: Payer: Self-pay | Admitting: Urology

## 2016-02-15 ENCOUNTER — Telehealth: Payer: Self-pay

## 2016-02-15 DIAGNOSIS — C61 Malignant neoplasm of prostate: Secondary | ICD-10-CM

## 2016-02-15 MED ORDER — DIAZEPAM 10 MG PO TABS: 10.0000 mg | ORAL_TABLET | Freq: Once | ORAL | 0 refills | Status: AC

## 2016-02-15 NOTE — Telephone Encounter (Signed)
Pt wife called requesting valium for MRI of prostate. Wife also stated that [facility] never gave them a definite answer as to if pt should stop blood thinners. Please advise.

## 2016-02-15 NOTE — Telephone Encounter (Signed)
Spoke with pt wife in reference to valium and blood thinners. Pt voiced understanding.

## 2016-02-15 NOTE — Telephone Encounter (Signed)
I'm on vacation today.  Please have shannon write for valium.  No reason to stop blood thinners.  Hollice Espy, MD

## 2016-02-15 NOTE — Telephone Encounter (Signed)
It looks like they have tried to call him several times to schedule this but he has not returned the calls. I will try to call him and find out why.  Sharyn Lull

## 2016-02-16 ENCOUNTER — Other Ambulatory Visit
Admission: RE | Admit: 2016-02-16 | Discharge: 2016-02-16 | Disposition: A | Discharge status: Home / Self Care | Payer: Medicare Other | Source: Ambulatory Visit | Attending: Urology | Admitting: Urology

## 2016-02-16 DIAGNOSIS — C61 Malignant neoplasm of prostate: Secondary | ICD-10-CM | POA: Insufficient documentation

## 2016-02-16 LAB — PSA: PSA: 13.09 ng/mL — ABNORMAL HIGH (ref 0.00–4.00)

## 2016-02-18 ENCOUNTER — Other Ambulatory Visit: Payer: Medicare Other

## 2016-02-22 ENCOUNTER — Ambulatory Visit (HOSPITAL_COMMUNITY)
Admission: RE | Admit: 2016-02-22 | Discharge: 2016-02-22 | Disposition: A | Discharge status: Home / Self Care | Payer: Medicare Other | Source: Ambulatory Visit | Attending: Urology | Admitting: Urology

## 2016-02-22 DIAGNOSIS — C61 Malignant neoplasm of prostate: Secondary | ICD-10-CM | POA: Diagnosis present

## 2016-02-22 DIAGNOSIS — Z8546 Personal history of malignant neoplasm of prostate: Secondary | ICD-10-CM | POA: Insufficient documentation

## 2016-02-22 LAB — POCT I-STAT CREATININE: CREATININE: 1.1 mg/dL (ref 0.61–1.24)

## 2016-02-22 MED ORDER — GADOBENATE DIMEGLUMINE 529 MG/ML IV SOLN
20.0000 mL | Freq: Once | INTRAVENOUS | Status: AC | PRN
  Administered 2016-02-22: 18 mL via INTRAVENOUS

## 2016-02-25 ENCOUNTER — Ambulatory Visit (INDEPENDENT_AMBULATORY_CARE_PROVIDER_SITE_OTHER): Payer: Medicare Other | Admitting: Urology

## 2016-02-25 ENCOUNTER — Encounter: Payer: Self-pay | Admitting: Urology

## 2016-02-25 VITALS — BP 162/77 | HR 64 | Ht 69.0 in | Wt 198.5 lb

## 2016-02-25 DIAGNOSIS — C61 Malignant neoplasm of prostate: Secondary | ICD-10-CM | POA: Diagnosis not present

## 2016-02-25 DIAGNOSIS — N3941 Urge incontinence: Secondary | ICD-10-CM

## 2016-02-25 NOTE — Progress Notes (Signed)
2:25 PM  02/25/16   Richard Chapman 05/31/1940 [id number]  Referring provider: Leonel Ramsay, MD [address] [place], [place] [address]  Chief Complaint  Patient presents with  . Prostate Cancer    4 month follow up MRI Results  . Over Active Bladder    HPI: 76 year old male with elevated PSA to 9.1 dx 09/2014 with Gleason 3+3 in 2 cores involving the right apex, up to 23% of tissue.  Rectal exam was benign. TRUS volume 60 cc.   He is on active surveillance and returns today following prostate MRI for surveillance purposes.  MRI performed on 02/22/2016 shows no evidence of obvious high-grade lesions.  He does have nodular transition zone.  Prostatic capsule is intact.. He does have some subclinical lymph nodes in the bilateral iliac measuring 6 mm.  Gland size 66 cc.    He does have urinary frequency, urgency, and nocturia which is fairly significant.. He now wear depends for both urge incontinence as well as fecal incontinence.  He failed Mybetriq 25 mg, Vesicare 5 mg and most recently tried Mybetriq 50 mg daily which did not help enough.  He does admit to continued drinking of coffee, tea, sodas and beer on a regular basis (has not cut back).  He has also started smoking again.  He also has severe baseline ED.   PSA trend below:  Component     Latest Ref Rng & Units 01/29/2015 06/02/2015 10/15/2015 02/16/2016  PSA     0.00 - 4.00 ng/mL 11.4 (H) 7.9 (H) 10.7 (H) 13.09 (H)    PMH: Past Medical History:  Diagnosis Date  . Anxiety and depression   . Depression   . Diabetes ([place])   . Elevated PSA   . GERD (gastroesophageal reflux disease)   . History of nephrolithiasis   . Hypertension   . Skin cancer   . Sleep apnea     Surgical History: Past Surgical History:  Procedure Laterality Date  . APPENDECTOMY    . BACK SURGERY    . BURR HOLE FOR SUBDURAL HEMATOMA     2015  . KNEE SURGERY Bilateral   . NECK SURGERY      . SHOULDER SURGERY    . TONSILLECTOMY    . WRIST SURGERY      Home Medications:  Allergies as of 02/25/2016   No Known Allergies     Medication List       Accurate as of 02/25/16  2:25 PM. Always use your most recent med list.          amLODipine 10 MG tablet Commonly known as:  NORVASC Take 1 tablet (10 mg total) by mouth daily.   aspirin 81 MG tablet Take 1 tablet (81 mg total) by mouth daily.   atorvastatin 10 MG tablet Commonly known as:  LIPITOR Take 5 mg by mouth daily.   cetirizine 10 MG tablet Commonly known as:  ZYRTEC Take by mouth.   clopidogrel 75 MG tablet Commonly known as:  PLAVIX Take 1 tablet (75 mg total) by mouth daily.   docusate sodium 100 MG capsule Commonly known as:  COLACE Take 1 capsule (100 mg total) by mouth 2 (two) times daily as needed for mild constipation.   escitalopram 20 MG tablet Commonly known as:  LEXAPRO Take 1 tablet (20 mg total) by mouth daily.   esomeprazole 20 MG capsule Commonly known as:  NEXIUM Take 1 capsule (20 mg total)  by mouth AC breakfast.   furosemide 40 MG tablet Commonly known as:  LASIX Take 1 tablet (40 mg total) by mouth daily.   gabapentin 300 MG capsule Commonly known as:  NEURONTIN Take 300 mg by mouth at bedtime.   HUMALOG 100 UNIT/ML injection Generic drug:  insulin lispro Inject 0-80 Units into the skin daily. Via VGo kit   hyoscyamine 0.375 MG 12 hr tablet Commonly known as:  LEVBID Take 0.375 mg by mouth every 12 (twelve) hours.   insulin degludec 100 UNIT/ML Sopn FlexTouch Pen Commonly known as:  TRESIBA START 20 UNITS EVERY DAY AND INCREASE DOSE BY 2 UNITS WEEKLY UNTIL FASTING BLOOD SUGARS CONSISTENTLY LESS THAT 140 . Patient states taking 35 units   isosorbide mononitrate 60 MG 24 hr tablet Commonly known as:  IMDUR Take 1 tablet (60 mg total) by mouth 2 (two) times daily.   loratadine 10 MG tablet Commonly known as:  CLARITIN Take 10 mg by mouth daily.   losartan 100 MG  tablet Commonly known as:  COZAAR Take by mouth.   magnesium oxide 400 MG tablet Commonly known as:  MAG-OX Take by mouth.   metFORMIN 1000 MG tablet Commonly known as:  GLUCOPHAGE Take 1 tablet (1,000 mg total) by mouth daily with breakfast.   nitroGLYCERIN 0.4 MG SL tablet Commonly known as:  NITROSTAT Place 0.4 mg under the tongue every 5 (five) minutes as needed for chest pain.   primidone 50 MG tablet Commonly known as:  MYSOLINE Take 1 tablet by mouth 2 (two) times daily.   propranolol 20 MG tablet Commonly known as:  INDERAL Take 20 mg by mouth 2 (two) times daily.   tamsulosin 0.4 MG Caps capsule Commonly known as:  FLOMAX Take 0.4 mg by mouth daily.   triamcinolone ointment 0.5 % Commonly known as:  KENALOG APPLY TO AFFECTED AREA TWICE A DAY FOR 14 DAYS   vitamin B-12 1000 MCG tablet Commonly known as:  CYANOCOBALAMIN Take by mouth.       Allergies: No Known Allergies  Family History: Family History  Problem Relation Age of Onset  . Heart disease Mother   . Heart disease Father   . Diabetes Father   . Stroke Father   . Kidney disease Neg Hx   . Prostate cancer Neg Hx     Social History:  reports that he has been smoking Cigarettes.  He has never used smokeless tobacco. He reports that he drinks about 1.2 oz of alcohol per week . He reports that he does not use drugs.  ROS: UROLOGY Frequent Urination?: Yes Hard to postpone urination?: Yes Burning/pain with urination?: No Get up at night to urinate?: Yes Leakage of urine?: Yes Urine stream starts and stops?: No Trouble starting stream?: No Do you have to strain to urinate?: No Blood in urine?: No Urinary tract infection?: No Sexually transmitted disease?: No Injury to kidneys or bladder?: No Painful intercourse?: No Weak stream?: No Erection problems?: Yes Penile pain?: No  Gastrointestinal Nausea?: No Vomiting?: No Indigestion/heartburn?: No Diarrhea?: Yes Constipation?:  Yes  Constitutional Fever: No Night sweats?: No Weight loss?: No Fatigue?: No  Skin Skin rash/lesions?: No Itching?: Yes  Eyes Blurred vision?: No Double vision?: No  Ears/Nose/Throat Sore throat?: No Sinus problems?: No  Hematologic/Lymphatic Swollen glands?: No Easy bruising?: Yes  Cardiovascular Leg swelling?: Yes Chest pain?: No  Respiratory Cough?: No Shortness of breath?: No  Endocrine Excessive thirst?: No  Musculoskeletal Back pain?: No Joint pain?: No  Neurological  Headaches?: No Dizziness?: No  Psychologic Depression?: No Anxiety?: No  Physical Exam: BP (!) 162/77   Pulse 64   Ht _0  (1.753 m)   Wt 198 lb 8 oz (90 kg)   BMI 29.31 kg/m   Constitutional:  Alert and oriented, No acute distress.  Somewhat frail-appearing, ambulating with cane. HEENT: [place] AT, moist mucus membranes.  Trachea midline, no masses. Cardiovascular: No clubbing, cyanosis, or edema. Respiratory: Normal respiratory effort, no increased work of breathing. GI: Abdomen is soft, nontender, nondistended, no abdominal masses Rectal exam: Deferred today, last rectal exam 9/17 Neurologic: Grossly intact, no focal deficits, moving all 4 extremities. Psychiatric: Normal mood and affect.  Laboratory Data: Lab Results  Component Value Date   WBC 13.0 (H) 09/24/2015   HGB 14.1 09/24/2015   HCT 40.4 09/24/2015   MCV 87.5 09/24/2015   PLT 190 09/24/2015    Lab Results  Component Value Date   CREATININE 1.10 02/22/2016   PSA trend as above  Pertinent Imaging: CLINICAL DATA:  Biopsy-proven prostate carcinoma. Evaluate for disease progression.  EXAM: MR PROSTATE WITHOUT AND WITH CONTRAST  TECHNIQUE: Multiplanar multisequence MRI images were obtained of the pelvis centered about the prostate. Pre and post contrast images were obtained.  CONTRAST:  72m MULTIHANCE GADOBENATE DIMEGLUMINE 529 MG/ML IV SOLN  COMPARISON:  None.  FINDINGS: Prostate:  Heterogeneous T2 signal within the peripheral zone on T2 weighted imaging with some streaking low signal intensity. On T1 weighted imaging there is some residual biopsy hemorrhage within the LEFT and RIGHT lateral mid gland.  In diffusion-weighted imaging, there is no discrete diffusion abnormality to suggest high-grade carcinoma. No clear abnormal enhancement.  The transitional zone is nodular with well capsulated nodules which are heterogeneous signal intensity T2 weighted imaging.  The Seminal vesicles appear normal. The prostatic capsule is intact.  Transcapsular spread:  Absent  Seminal vesicle involvement: Absent  Neurovascular bundle involvement: Absent  Pelvic adenopathy: Small 6 mm short axis LEFT common iliac lymph node (image 14, series 4). Adjacent 5 mm short axis node on image 9.  RIGHT external iliac node measures 6 mm proximally image 12, series 4.  Bone metastasis: Absent  Other findings: Prostate gland measures 4.0 x 5.8 x 5.4 cm (volume = 66 cm^3)  IMPRESSION: 1. No evidence of high-grade carcinoma in the peripheral zone. 2. Nodular transitional zone. 3. Prostatic capsule is intact. 4. Small bilateral iliac lymph nodes.   Electronically Signed   By: SSuzy BouchardM.D.   On: 02/22/2016 16:27  Prostate MRI personally reviewed today and with the patient.  Assessment & Plan:    1. Prostate cancer ([facility] On active surveillance Most recent MRI 1/18 negative PSA stable up but history of fluctuation given multiple medical comorbidities, we'll continue to follow closely Recommend continuation close follow-up with rectal exam/PSA We'll recheck in 4 months, if PSA down or stabilized, will consider pushing out follow-up to every 6 months - PSA - Urinalysis, Complete  2. Urge incontinence/ OAB Significant baseline urinary urgency and urge incontinence.  Has not implamented any behavioral modification as discussed- continues to drink coffee,  beer, etc on regular basis including in middle of night  No role for medical/ procedure intervention unless implements behavioral modification    Return in about 4 months (around 06/24/2016) for PSA/ DRE.   AHollice Espy MD  [facility] [address] [place] [phone]

## 2016-06-23 ENCOUNTER — Other Ambulatory Visit
Admission: RE | Admit: 2016-06-23 | Discharge: 2016-06-23 | Disposition: A | Discharge status: Home / Self Care | Payer: Medicare Other | Source: Ambulatory Visit | Attending: Urology | Admitting: Urology

## 2016-06-23 ENCOUNTER — Encounter: Payer: Self-pay | Admitting: Urology

## 2016-06-23 ENCOUNTER — Ambulatory Visit (INDEPENDENT_AMBULATORY_CARE_PROVIDER_SITE_OTHER): Payer: Medicare Other | Admitting: Urology

## 2016-06-23 VITALS — BP 135/71 | HR 68 | Ht 68.0 in | Wt 185.0 lb

## 2016-06-23 DIAGNOSIS — N3281 Overactive bladder: Secondary | ICD-10-CM | POA: Diagnosis not present

## 2016-06-23 DIAGNOSIS — C61 Malignant neoplasm of prostate: Secondary | ICD-10-CM | POA: Diagnosis present

## 2016-06-23 DIAGNOSIS — N3941 Urge incontinence: Secondary | ICD-10-CM | POA: Diagnosis not present

## 2016-06-23 NOTE — Progress Notes (Signed)
2:01 PM  06/23/16   Richard Chapman [birth year] [id number]  Referring provider: Leonel Ramsay, MD [address] [place], [place] [address]  No chief complaint on file.   HPI: 76 year old male with prostate cancer on active surveillance returns today for follow-up.  Prostate cancer  Elevated PSA to 9.1 dx 09/2014 with Gleason 3+3 in 2 cores involving the right apex, up to 23% of tissue.  Rectal exam was benign. TRUS volume 60 cc.     MRI performed on 02/22/2016 shows no evidence of obvious high-grade lesions.  He does have nodular transition zone.  Prostatic capsule is intact.. He does have some subclinical lymph nodes in the bilateral iliac measuring 6 mm.  Gland size 66 cc.     OAB/ urge incontinence He does have urinary frequency, urgency, and nocturia which is fairly significant.. He now wear depends for both urge incontinence as well as fecal incontinence.  He failed Mybetriq 25 mg, Vesicare 5 mg and most recently tried Mybetriq 50 mg daily which did not help enough.  He does admit to continued drinking of coffee, tea, sodas and beer on a regular basis (has not cut back).   He does report that he has been doing a better job of controlling his diabetes.  ED He also has severe baseline ED.   PSA trend below:  Component     Latest Ref Rng & Units 01/29/2015 06/02/2015 10/15/2015 02/16/2016  PSA     0.00 - 4.00 ng/mL 11.4 (H) 7.9 (H) 10.7 (H) 13.09 (H)    PMH: Past Medical History:  Diagnosis Date  . Anxiety and depression   . Depression   . Diabetes ([place])   . Elevated PSA   . GERD (gastroesophageal reflux disease)   . History of nephrolithiasis   . Hypertension   . Skin cancer   . Sleep apnea     Surgical History: Past Surgical History:  Procedure Laterality Date  . APPENDECTOMY    . BACK SURGERY    . BURR HOLE FOR SUBDURAL HEMATOMA     2015  . KNEE SURGERY Bilateral   . NECK SURGERY    . SHOULDER SURGERY    . TONSILLECTOMY    . WRIST SURGERY       Home Medications:  Allergies as of 06/23/2016   No Known Allergies     Medication List       Accurate as of 06/23/16  2:01 PM. Always use your most recent med list.          amLODipine 10 MG tablet Commonly known as:  NORVASC Take 1 tablet (10 mg total) by mouth daily.   atorvastatin 10 MG tablet Commonly known as:  LIPITOR Take 5 mg by mouth daily.   cetirizine 10 MG tablet Commonly known as:  ZYRTEC Take by mouth.   clopidogrel 75 MG tablet Commonly known as:  PLAVIX Take 1 tablet (75 mg total) by mouth daily.   docusate sodium 100 MG capsule Commonly known as:  COLACE Take 1 capsule (100 mg total) by mouth 2 (two) times daily as needed for mild constipation.   escitalopram 20 MG tablet Commonly known as:  LEXAPRO Take 1 tablet (20 mg total) by mouth daily.   esomeprazole 20 MG capsule Commonly known as:  NEXIUM Take 1 capsule (20 mg total) by mouth AC breakfast.   furosemide 40 MG tablet Commonly known as:  LASIX Take 1 tablet (40 mg total) by mouth daily.   gabapentin  300 MG capsule Commonly known as:  NEURONTIN Take 300 mg by mouth at bedtime.   hyoscyamine 0.375 MG 12 hr tablet Commonly known as:  LEVBID Take 0.375 mg by mouth every 12 (twelve) hours.   insulin degludec 100 UNIT/ML Sopn FlexTouch Pen Commonly known as:  TRESIBA START 20 UNITS EVERY DAY AND INCREASE DOSE BY 2 UNITS WEEKLY UNTIL FASTING BLOOD SUGARS CONSISTENTLY LESS THAT 140 . Patient states taking 35 units   isosorbide mononitrate 60 MG 24 hr tablet Commonly known as:  IMDUR Take 1 tablet (60 mg total) by mouth 2 (two) times daily.   losartan 100 MG tablet Commonly known as:  COZAAR Take by mouth.   magnesium oxide 400 MG tablet Commonly known as:  MAG-OX Take by mouth.   metFORMIN 1000 MG tablet Commonly known as:  GLUCOPHAGE Take 1 tablet (1,000 mg total) by mouth daily with breakfast.   nitroGLYCERIN 0.4 MG SL tablet Commonly known as:  NITROSTAT Place 0.4 mg  under the tongue every 5 (five) minutes as needed for chest pain.   primidone 50 MG tablet Commonly known as:  MYSOLINE Take 1 tablet by mouth 2 (two) times daily.   propranolol 20 MG tablet Commonly known as:  INDERAL Take 20 mg by mouth 2 (two) times daily.   tamsulosin 0.4 MG Caps capsule Commonly known as:  FLOMAX Take 0.4 mg by mouth daily.   triamcinolone ointment 0.5 % Commonly known as:  KENALOG APPLY TO AFFECTED AREA TWICE A DAY FOR 14 DAYS   vitamin B-12 1000 MCG tablet Commonly known as:  CYANOCOBALAMIN Take by mouth.       Allergies: No Known Allergies  Family History: Family History  Problem Relation Age of Onset  . Heart disease Mother   . Heart disease Father   . Diabetes Father   . Stroke Father   . Kidney disease Neg Hx   . Prostate cancer Neg Hx     Social History:  reports that he has been smoking Cigarettes.  He has never used smokeless tobacco. He reports that he drinks about 1.2 oz of alcohol per week . He reports that he does not use drugs.  ROS: UROLOGY Frequent Urination?: Yes Hard to postpone urination?: Yes Burning/pain with urination?: No Get up at night to urinate?: Yes Leakage of urine?: No Urine stream starts and stops?: No Trouble starting stream?: No Do you have to strain to urinate?: No Blood in urine?: No Urinary tract infection?: No Sexually transmitted disease?: No Injury to kidneys or bladder?: No Painful intercourse?: No Weak stream?: No Erection problems?: Yes Penile pain?: No  Gastrointestinal Nausea?: No Vomiting?: No Indigestion/heartburn?: Yes Diarrhea?: No Constipation?: No  Constitutional Fever: No Night sweats?: No Weight loss?: No Fatigue?: No  Skin Skin rash/lesions?: No Itching?: Yes  Eyes Blurred vision?: No Double vision?: No  Ears/Nose/Throat Sore throat?: No Sinus problems?: No  Hematologic/Lymphatic Swollen glands?: No Easy bruising?: Yes  Cardiovascular Leg swelling?:  No Chest pain?: No  Respiratory Cough?: Yes Shortness of breath?: No  Endocrine Excessive thirst?: No  Musculoskeletal Back pain?: No Joint pain?: No  Neurological Headaches?: No Dizziness?: No  Psychologic Depression?: No Anxiety?: No  Physical Exam: BP 135/71   Pulse 68   Ht 5\' 8"  (1.727 m)   Wt 185 lb (83.9 kg)   BMI 28.13 kg/m   Constitutional:  Alert and oriented, No acute distress.  Ambulating with cane.  Accompanied by wife today. HEENT: [place] AT, moist mucus membranes.  Trachea  midline, no masses. Cardiovascular: No clubbing, cyanosis, or edema. Respiratory: Normal respiratory effort, no increased work of breathing. GI: Abdomen is soft, nontender, nondistended, no abdominal masses Rectal exam: Decrease sphincter tone. Enlarged 60 cc prostate, unable to palpate base due to size, no obvious nodules. Neurologic: Grossly intact, no focal deficits, moving all 4 extremities. Psychiatric: Normal mood and affect.  Laboratory Data: Lab Results  Component Value Date   WBC 13.0 (H) 09/24/2015   HGB 14.1 09/24/2015   HCT 40.4 09/24/2015   MCV 87.5 09/24/2015   PLT 190 09/24/2015    Lab Results  Component Value Date   CREATININE 1.10 02/22/2016   PSA trend as above  Pertinent Imaging: n/a  Prostate MRI personally reviewed today and with the patient.  Assessment & Plan:    1. Prostate cancer [facility]) On active surveillance Most recent MRI 1/18 negative- subclinical nodes, no area of obvious high risk diease PSA stable up but history of fluctuation given multiple medical comorbidities, we'll continue to follow closely Consider following q6 months with serial PSA/ DRE pending PSA today- will call with results - PSA - Urinalysis, Complete  2. Urge incontinence/ OAB Significant baseline urinary urgency and urge incontinence.  Has not implamented any behavioral modification as discussed- continues to drink coffee, beer, etc on regular basis including in middle of  night  No role for medical/ procedure intervention unless implements behavioral modification    Return in about 6 months (around 12/24/2016) for PSA.   Hollice Espy, MD  [facility] [address], [place], [place] [address] [phone]

## 2016-06-24 LAB — PSA: PSA: 11.93 ng/mL — ABNORMAL HIGH (ref 0.00–4.00)

## 2016-06-27 ENCOUNTER — Telehealth: Payer: Self-pay

## 2016-06-27 NOTE — Telephone Encounter (Signed)
-----   Message from Hollice Espy, MD sent at 06/26/2016  8:53 PM EDT ----- PSA stable.  See you in 6 months.  Hollice Espy, MD

## 2016-06-27 NOTE — Telephone Encounter (Signed)
Spoke with pt wife in reference to PSA results. Made aware will see in 30mo. Wife voiced understanding.

## 2016-12-14 ENCOUNTER — Inpatient Hospital Stay
Admission: EM | Admit: 2016-12-14 | Discharge: 2016-12-18 | DRG: 919 | Disposition: A | Discharge status: Home Health | Payer: Medicare Other | Attending: Internal Medicine | Admitting: Internal Medicine

## 2016-12-14 ENCOUNTER — Emergency Department: Payer: Medicare Other

## 2016-12-14 ENCOUNTER — Encounter: Payer: Self-pay | Admitting: Emergency Medicine

## 2016-12-14 DIAGNOSIS — E871 Hypo-osmolality and hyponatremia: Secondary | ICD-10-CM | POA: Diagnosis present

## 2016-12-14 DIAGNOSIS — F17210 Nicotine dependence, cigarettes, uncomplicated: Secondary | ICD-10-CM | POA: Diagnosis present

## 2016-12-14 DIAGNOSIS — R7881 Bacteremia: Secondary | ICD-10-CM | POA: Diagnosis present

## 2016-12-14 DIAGNOSIS — A400 Sepsis due to streptococcus, group A: Secondary | ICD-10-CM | POA: Diagnosis present

## 2016-12-14 DIAGNOSIS — Y838 Other surgical procedures as the cause of abnormal reaction of the patient, or of later complication, without mention of misadventure at the time of the procedure: Secondary | ICD-10-CM | POA: Diagnosis present

## 2016-12-14 DIAGNOSIS — L7682 Other postprocedural complications of skin and subcutaneous tissue: Secondary | ICD-10-CM | POA: Diagnosis not present

## 2016-12-14 DIAGNOSIS — Z7902 Long term (current) use of antithrombotics/antiplatelets: Secondary | ICD-10-CM

## 2016-12-14 DIAGNOSIS — F329 Major depressive disorder, single episode, unspecified: Secondary | ICD-10-CM | POA: Diagnosis present

## 2016-12-14 DIAGNOSIS — G4730 Sleep apnea, unspecified: Secondary | ICD-10-CM | POA: Diagnosis present

## 2016-12-14 DIAGNOSIS — R54 Age-related physical debility: Secondary | ICD-10-CM | POA: Diagnosis present

## 2016-12-14 DIAGNOSIS — L03221 Cellulitis of neck: Secondary | ICD-10-CM | POA: Diagnosis present

## 2016-12-14 DIAGNOSIS — E119 Type 2 diabetes mellitus without complications: Secondary | ICD-10-CM | POA: Diagnosis present

## 2016-12-14 DIAGNOSIS — Z7983 Long term (current) use of bisphosphonates: Secondary | ICD-10-CM

## 2016-12-14 DIAGNOSIS — R509 Fever, unspecified: Secondary | ICD-10-CM

## 2016-12-14 DIAGNOSIS — Z8249 Family history of ischemic heart disease and other diseases of the circulatory system: Secondary | ICD-10-CM

## 2016-12-14 DIAGNOSIS — I10 Essential (primary) hypertension: Secondary | ICD-10-CM | POA: Diagnosis present

## 2016-12-14 DIAGNOSIS — E860 Dehydration: Secondary | ICD-10-CM | POA: Diagnosis present

## 2016-12-14 DIAGNOSIS — E876 Hypokalemia: Secondary | ICD-10-CM | POA: Diagnosis present

## 2016-12-14 DIAGNOSIS — K5900 Constipation, unspecified: Secondary | ICD-10-CM | POA: Diagnosis present

## 2016-12-14 DIAGNOSIS — R531 Weakness: Secondary | ICD-10-CM

## 2016-12-14 DIAGNOSIS — Z794 Long term (current) use of insulin: Secondary | ICD-10-CM

## 2016-12-14 DIAGNOSIS — F419 Anxiety disorder, unspecified: Secondary | ICD-10-CM | POA: Diagnosis present

## 2016-12-14 DIAGNOSIS — N179 Acute kidney failure, unspecified: Secondary | ICD-10-CM | POA: Diagnosis present

## 2016-12-14 DIAGNOSIS — Z66 Do not resuscitate: Secondary | ICD-10-CM | POA: Diagnosis present

## 2016-12-14 DIAGNOSIS — A419 Sepsis, unspecified organism: Secondary | ICD-10-CM | POA: Diagnosis present

## 2016-12-14 DIAGNOSIS — Z79899 Other long term (current) drug therapy: Secondary | ICD-10-CM

## 2016-12-14 DIAGNOSIS — Z85828 Personal history of other malignant neoplasm of skin: Secondary | ICD-10-CM

## 2016-12-14 DIAGNOSIS — Z823 Family history of stroke: Secondary | ICD-10-CM

## 2016-12-14 DIAGNOSIS — K219 Gastro-esophageal reflux disease without esophagitis: Secondary | ICD-10-CM | POA: Diagnosis present

## 2016-12-14 DIAGNOSIS — I2510 Atherosclerotic heart disease of native coronary artery without angina pectoris: Secondary | ICD-10-CM | POA: Diagnosis present

## 2016-12-14 LAB — COMPREHENSIVE METABOLIC PANEL
ALBUMIN: 3.5 g/dL (ref 3.5–5.0)
ALT: 17 U/L (ref 17–63)
AST: 25 U/L (ref 15–41)
Alkaline Phosphatase: 50 U/L (ref 38–126)
Anion gap: 10 (ref 5–15)
BILIRUBIN TOTAL: 1.1 mg/dL (ref 0.3–1.2)
BUN: 14 mg/dL (ref 6–20)
CHLORIDE: 93 mmol/L — AB (ref 101–111)
CO2: 26 mmol/L (ref 22–32)
CREATININE: 1.08 mg/dL (ref 0.61–1.24)
Calcium: 8.9 mg/dL (ref 8.9–10.3)
GFR calc Af Amer: >60 mL/min (ref >60)
GFR calc non Af Amer: >60 mL/min (ref >60)
GLUCOSE: 189 mg/dL — AB (ref 65–99)
POTASSIUM: 3.1 mmol/L — AB (ref 3.5–5.1)
Sodium: 129 mmol/L — ABNORMAL LOW (ref 135–145)
TOTAL PROTEIN: 6.6 g/dL (ref 6.5–8.1)

## 2016-12-14 LAB — URINALYSIS, COMPLETE (UACMP) WITH MICROSCOPIC
BACTERIA UA: NONE SEEN
Bilirubin Urine: NEGATIVE
GLUCOSE, UA: 100 mg/dL — AB
KETONES UR: NEGATIVE mg/dL
Leukocytes, UA: NEGATIVE
Nitrite: NEGATIVE
PROTEIN: 100 mg/dL — AB
Specific Gravity, Urine: 1.015 (ref 1.005–1.030)
pH: 7 (ref 5.0–8.0)

## 2016-12-14 LAB — CBC WITH DIFFERENTIAL/PLATELET
BASOS ABS: 0 10*3/uL (ref 0–0.1)
BASOS PCT: 0 %
Eosinophils Absolute: 0 10*3/uL (ref 0–0.7)
Eosinophils Relative: 0 %
HCT: 41.8 % (ref 40.0–52.0)
HEMOGLOBIN: 14.6 g/dL (ref 13.0–18.0)
LYMPHS PCT: 11 %
Lymphs Abs: 1.5 10*3/uL (ref 1.0–3.6)
MCH: 32.1 pg (ref 26.0–34.0)
MCHC: 35 g/dL (ref 32.0–36.0)
MCV: 91.9 fL (ref 80.0–100.0)
MONO ABS: 0.8 10*3/uL (ref 0.2–1.0)
MONOS PCT: 6 %
NEUTROS ABS: 11.1 10*3/uL — AB (ref 1.4–6.5)
Neutrophils Relative %: 83 %
Platelets: 143 10*3/uL — ABNORMAL LOW (ref 150–440)
RBC: 4.55 MIL/uL (ref 4.40–5.90)
RDW: 14.7 % — ABNORMAL HIGH (ref 11.5–14.5)
WBC: 13.5 10*3/uL — ABNORMAL HIGH (ref 3.8–10.6)

## 2016-12-14 LAB — URINALYSIS, ROUTINE W REFLEX MICROSCOPIC
BACTERIA UA: NONE SEEN
BILIRUBIN URINE: NEGATIVE
Glucose, UA: 150 mg/dL — AB
HGB URINE DIPSTICK: NEGATIVE
KETONES UR: NEGATIVE mg/dL
LEUKOCYTES UA: NEGATIVE
NITRITE: NEGATIVE
PH: 7 (ref 5.0–8.0)
Protein, ur: 100 mg/dL — AB
Specific Gravity, Urine: 1.012 (ref 1.005–1.030)
Squamous Epithelial / LPF: NONE SEEN

## 2016-12-14 LAB — INFLUENZA PANEL BY PCR (TYPE A & B)
INFLBPCR: NEGATIVE
Influenza A By PCR: NEGATIVE

## 2016-12-14 LAB — TROPONIN I: Troponin I: 0.03 ng/mL (ref <0.03)

## 2016-12-14 LAB — LACTIC ACID, PLASMA: Lactic Acid, Venous: 2.8 mmol/L (ref 0.5–1.9)

## 2016-12-14 LAB — CK: Total CK: 39 U/L — ABNORMAL LOW (ref 49–397)

## 2016-12-14 MED ORDER — KETOROLAC TROMETHAMINE 30 MG/ML IJ SOLN
15.0000 mg | Freq: Once | INTRAMUSCULAR | Status: AC
  Administered 2016-12-14: 15 mg via INTRAVENOUS

## 2016-12-14 MED ORDER — KETOROLAC TROMETHAMINE 30 MG/ML IJ SOLN
15.0000 mg | Freq: Once | INTRAMUSCULAR | Status: AC
  Administered 2016-12-14: 15 mg via INTRAVENOUS
  Filled 2016-12-14: qty 1

## 2016-12-14 MED ORDER — SODIUM CHLORIDE 0.9 % IV BOLUS (SEPSIS)
500.0000 mL | Freq: Once | INTRAVENOUS | Status: AC
  Administered 2016-12-14: 500 mL via INTRAVENOUS

## 2016-12-14 MED ORDER — OXYCODONE HCL 5 MG PO TABS: 5.0000 mg | ORAL_TABLET | Freq: Four times a day (QID) | ORAL | Status: DC | PRN

## 2016-12-14 MED ORDER — KETOROLAC TROMETHAMINE 30 MG/ML IJ SOLN
INTRAMUSCULAR | Status: AC
  Filled 2016-12-14: qty 1

## 2016-12-14 MED ORDER — MORPHINE SULFATE (PF) 2 MG/ML IV SOLN
2.0000 mg | Freq: Once | INTRAVENOUS | Status: AC
  Administered 2016-12-14: 2 mg via INTRAVENOUS
  Filled 2016-12-14: qty 1

## 2016-12-14 NOTE — ED Triage Notes (Signed)
Pt in via [facility] from home with complaints of generalized weakness and body aches beginning this morning.  Pt A/Ox4, NAD noted at this time.

## 2016-12-14 NOTE — ED Notes (Signed)
Family at bedside. 

## 2016-12-14 NOTE — ED Provider Notes (Addendum)
[facility] Emergency Department Provider Note ____________________________________________   First MD Initiated Contact with Patient 12/14/16 1839     (approximate)  I have reviewed the triage vital signs and the nursing notes.   HISTORY  Chief Complaint Weakness    HPI Richard Chapman is a 76 y.o. male with past medical history as noted below who presents with generalized weakness for approximately last 2-3 days, gradual onset, worsening, and associated with fever today.  Per EMS patient was in bed all day and was found in a pool of sweat.  Patient reports recent urinary frequency and a bad odor to the urine.  He also reports generalized body aches and bilateral frontal headache.  He denies cough, difficulty breathing, or gastrointestinal symptoms.   Past Medical History:  Diagnosis Date  . Anxiety and depression   . Depression   . Diabetes ([place])   . Elevated PSA   . GERD (gastroesophageal reflux disease)   . History of nephrolithiasis   . Hypertension   . Skin cancer   . Sleep apnea     Patient Active Problem List   Diagnosis Date Noted  . Atypical chest pain 09/24/2015  . Elevated WBC count 05/05/2015  . 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 CVA (cerebral infarction) 04/06/2015  . Cerebral infarction ([place]) 04/06/2015  . Malignant neoplasm of prostate ([place]) 01/29/2015  . Cervical disc disease 12/03/2014  . Chest pain 11/25/2014  . Arthritis 08/20/2014  . Benign fibroma of prostate 08/20/2014  . Clinical depression 08/20/2014  . Failure of erection 08/20/2014  . Enlarged prostate 08/20/2014  . Major depressive disorder with single episode 08/20/2014  . 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  . Benign essential HTN 08/04/2014  . Acute low back pain 04/23/2014  . Alteration in bowel elimination: incontinence 04/23/2014  . Urge incontinence 04/23/2014  . 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 Benign essential  tremor 12/23/2013  . B12 deficiency 08/13/2013  . Chronic kidney disease (CKD), stage III (moderate) ([place]) 08/13/2013  . Diabetes ([place]) 08/13/2013  . Hypoglycemia 08/13/2013  . Diabetes mellitus ([place]) 08/13/2013  . Type 2 diabetes mellitus ([place]) 08/13/2013  . Arteriosclerosis of coronary artery 07/24/2013  . Fatigue 07/24/2013  . Acid reflux 07/24/2013  . Combined fat and carbohydrate induced hyperlipemia 07/24/2013  . CAD in native artery 07/24/2013  . Gastro-esophageal reflux disease without esophagitis 07/24/2013  . 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 Subdural hematoma ([place]) 03/26/2013  . Traumatic subdural hemorrhage ([place]) 03/26/2013  . Abnormal prostate specific antigen 09/01/2012  . Benign prostatic hyperplasia with urinary obstruction 09/01/2012  . Elevated prostate specific antigen (PSA) 09/01/2012    Past Surgical History:  Procedure Laterality Date  . APPENDECTOMY    . BACK SURGERY    . BURR HOLE FOR SUBDURAL HEMATOMA     2015  . KNEE SURGERY Bilateral   . NECK SURGERY    . SHOULDER SURGERY    . TONSILLECTOMY    . WRIST SURGERY      Prior to Admission medications   Medication Sig Start Date End Date Taking? Authorizing Provider  amLODipine (NORVASC) 10 MG tablet Take 1 tablet (10 mg total) by mouth daily. 04/07/15   Fritzi Mandes, MD  atorvastatin (LIPITOR) 10 MG tablet Take 5 mg by mouth daily.    [provider]  cetirizine (ZYRTEC) 10 MG tablet Take by mouth.    [provider]  clopidogrel (PLAVIX) 75 MG tablet Take 1 tablet (75 mg total)  by mouth daily. 04/07/15   Fritzi Mandes, MD  docusate sodium (COLACE) 100 MG capsule Take 1 capsule (100 mg total) by mouth 2 (two) times daily as needed for mild constipation. 11/26/14   Gouru, Illene Silver, MD  escitalopram (LEXAPRO) 20 MG tablet Take 1 tablet (20 mg total) by mouth daily. 11/26/14   Nicholes Mango, MD  esomeprazole (NEXIUM) 20 MG capsule Take 1 capsule (20 mg total) by mouth AC breakfast.  09/24/15   Fritzi Mandes, MD  furosemide (LASIX) 40 MG tablet Take 1 tablet (40 mg total) by mouth daily. 11/26/14   Gouru, Illene Silver, MD  gabapentin (NEURONTIN) 300 MG capsule Take 300 mg by mouth at bedtime.     [provider]  hyoscyamine (LEVBID) 0.375 MG 12 hr tablet Take 0.375 mg by mouth every 12 (twelve) hours.    [provider]  insulin degludec (TRESIBA) 100 UNIT/ML SOPN FlexTouch Pen START 20 UNITS EVERY DAY AND INCREASE DOSE BY 2 UNITS WEEKLY UNTIL FASTING BLOOD SUGARS CONSISTENTLY LESS THAT 140 . Patient states taking 35 units 12/22/15   [provider]  isosorbide mononitrate (IMDUR) 60 MG 24 hr tablet Take 1 tablet (60 mg total) by mouth 2 (two) times daily. Patient taking differently: Take 60 mg by mouth daily.  11/26/14   Gouru, Illene Silver, MD  losartan (COZAAR) 100 MG tablet Take by mouth. 01/13/16 01/12/17  [provider]  magnesium oxide (MAG-OX) 400 MG tablet Take by mouth.    [provider]  metFORMIN (GLUCOPHAGE) 1000 MG tablet Take 1 tablet (1,000 mg total) by mouth daily with breakfast. Patient taking differently: Take 500 mg by mouth 2 (two) times daily with a meal.  11/28/14   Gouru, Aruna, MD  nitroGLYCERIN (NITROSTAT) 0.4 MG SL tablet Place 0.4 mg under the tongue every 5 (five) minutes as needed for chest pain.     [provider]  primidone (MYSOLINE) 50 MG tablet Take 1 tablet by mouth 2 (two) times daily. 11/04/14   [provider]  propranolol (INDERAL) 20 MG tablet Take 20 mg by mouth 2 (two) times daily.     [provider]  tamsulosin (FLOMAX) 0.4 MG CAPS capsule Take 0.4 mg by mouth daily.     [provider]  triamcinolone ointment (KENALOG) 0.5 % APPLY TO AFFECTED AREA TWICE A DAY FOR 14 DAYS 01/21/16   [provider]  vitamin B-12 (CYANOCOBALAMIN) 1000 MCG tablet Take by mouth.    [provider]    Allergies Patient has no known allergies.  Family History  Problem  Relation Age of Onset  . Heart disease Mother   . Heart disease Father   . Diabetes Father   . Stroke Father   . Kidney disease Neg Hx   . Prostate cancer Neg Hx     Social History Social History  Substance Use Topics  . Smoking status: Current Every Day Smoker    Packs/day: 1.00    Types: Cigarettes  . Smokeless tobacco: Never Used  . Alcohol use 1.2 oz/week    2 Standard drinks or equivalent per week    Review of Systems  Constitutional: Positive for fever Eyes: No redness. ENT: No sore throat. Cardiovascular: Denies chest pain. Respiratory: Denies shortness of breath. Gastrointestinal: No nausea, no vomiting.  No diarrhea.  Genitourinary: Positive for urinary frequency.  Musculoskeletal: Positive for myalgias. Skin: Negative for rash. Neurological: Positive for headache.   ____________________________________________   PHYSICAL EXAM:  VITAL SIGNS: ED Triage Vitals  Enc  Vitals Group     BP --      Pulse --      Resp --      Temp 12/14/16 1841 100.2 F (37.9 C)     Temp Source 12/14/16 1841 Oral     SpO2 --      Weight 12/14/16 1843 185 lb (83.9 kg)     Height 12/14/16 1843 5\' 9"  (0.865 m)     Head Circumference --      Peak Flow --      Pain Score 12/14/16 1841 9     Pain Loc --      Pain Edu? --      Excl. in [place]? --     Constitutional: Alert and oriented.  Weak appearing, but in no acute distress.  Eyes: Conjunctivae are normal.  EOMI.  PERRLA.  No photophobia. Head: Atraumatic. Nose: No congestion/rhinnorhea. Mouth/Throat: Mucous membranes are dry.   Neck: Normal range of motion.  No meningeal signs. Cardiovascular: Normal rate, regular rhythm. Grossly normal heart sounds.  Good peripheral circulation. Respiratory: Normal respiratory effort.  No retractions.  Faint rales to bilateral bases.. Gastrointestinal: Soft and nontender. No distention.  Genitourinary: Mild bilateral CVA tenderness. Musculoskeletal: No lower extremity edema.  Extremities  warm and well perfused.  Neurologic:  Normal speech and language. No gross focal neurologic deficits are appreciated.  Skin:  Skin is warm and dry. No rash noted. Psychiatric: Mood and affect are normal. Speech and behavior are normal.  ____________________________________________   LABS (all labs ordered are listed, but only abnormal results are displayed)  Labs Reviewed  LACTIC ACID, PLASMA - Abnormal; Notable for the following:       Result Value   Lactic Acid, Venous 2.8 (*)    All other components within normal limits  COMPREHENSIVE METABOLIC PANEL - Abnormal; Notable for the following:    Sodium 129 (*)    Potassium 3.1 (*)    Chloride 93 (*)    Glucose, Bld 189 (*)    All other components within normal limits  TROPONIN I - Abnormal; Notable for the following:    Troponin I 0.03 (*)    All other components within normal limits  CBC WITH DIFFERENTIAL/PLATELET - Abnormal; Notable for the following:    WBC 13.5 (*)    RDW 14.7 (*)    Platelets 143 (*)    Neutro Abs 11.1 (*)    All other components within normal limits  URINALYSIS, ROUTINE W REFLEX MICROSCOPIC - Abnormal; Notable for the following:    Color, Urine YELLOW (*)    APPearance CLEAR (*)    Glucose, UA 150 (*)    Protein, ur 100 (*)    All other components within normal limits  URINALYSIS, COMPLETE (UACMP) WITH MICROSCOPIC - Abnormal; Notable for the following:    Glucose, UA 100 (*)    Hgb urine dipstick TRACE (*)    Protein, ur 100 (*)    Squamous Epithelial / LPF 0-5 (*)    All other components within normal limits  CK - Abnormal; Notable for the following:    Total CK 39 (*)    All other components within normal limits  CULTURE, BLOOD (ROUTINE X 2)  CULTURE, BLOOD (ROUTINE X 2)  INFLUENZA PANEL BY PCR (TYPE A & B)  LACTIC ACID, PLASMA   ____________________________________________  EKG  ED ECG REPORT I, Arta Silence, the attending physician, personally viewed and interpreted this  ECG.  Date: 12/14/2016 EKG Time: 1945 Rate:  90 Rhythm: normal sinus rhythm QRS Axis: normal Intervals: normal ST/T Wave abnormalities: normal Narrative Interpretation: no evidence of acute ischemia; no significant change when compared to EKG of 09/23/2015  ____________________________________________  RADIOLOGY  CXR: No focal infiltrate or other acute findings ____________________________________________   PROCEDURES  Procedure(s) performed: No    Critical Care performed: No ____________________________________________   INITIAL IMPRESSION / ASSESSMENT AND PLAN / ED COURSE  Pertinent labs & imaging results that were available during my care of the patient were reviewed by me and considered in my medical decision making (see chart for details).  76 year old male with extensive past medical history as noted above presents with generalized weakness for the last several days, associated with fever, generalized body aches, and urinary frequency.  On exam, vital signs are normal except for low-grade temperature, and otherwise exam as noted above.  On review of past medical records in [place], the patient had an admission approximately 1 year ago for chest pain but no significant recent infectious history.  No cultures of any kind available in the last 2 years.  Past records are otherwise noncontributory.  Differential includes most likely infection; suspect urinary tract infection versus less likely pneumonia, flu or other viral infection.  No evidence for meningitis.  Plan: labs, infection workup, fluids, analgesia and reassess.     ----------------------------------------- 8:49 PM on 12/14/2016 -----------------------------------------  The workup so far is generally non-diagnostic.  Patient has elevated lactate, chem significant only for mild hyponatremia and hypokalemia, CBC significant for mild WBC elevation, and the UA is negative.  CXR is also negative, as is the flu test.   I suspect most likely viral etiology with associated dehydration and electrolyte abnormalities.  There is still no evidence to support meningitis.  Given that VS are stable and lactate is only minimally elevated, there is no indication for empiric abx for now.  At this time we will obtain some additional workup - given patient's urinary symptoms, I am still concerned for UTI despite the negative initial UA so we will repeat this to verify it is not a lab error.  I will also obtain a CK due to patient's generalized bodyaches and subjective weakness.  I discussed dispo plans with patient and wife; patient states he has had difficulty walking in the last 1-2 days due to generalized weakness, and also due to some pain in his right thigh.  He has 5/5 strength on my repeat neuro exam.  He has no visible or palpable abnormalities or tenderness to the RLE, and FROM at all joints.    Overall suspect patient may be having muscle weakness due to dehydration and the electrolyte abnormalities - will plan for admission once the remainder of the workup is resulted.    ----------------------------------------- 9:49 PM on 12/14/2016 -----------------------------------------  UA and additional workup are negative.  Patient symptoms remain similar.  I will proceed with admission.  Signed out to hospitalist, Dr. Jannifer Franklin.  ____________________________________________   FINAL CLINICAL IMPRESSION(S) / ED DIAGNOSES  Final diagnoses:  Febrile illness  Generalized weakness  Hyponatremia  Dehydration      NEW MEDICATIONS STARTED DURING THIS VISIT:  New Prescriptions   No medications on file     Note:  This document was prepared using Dragon voice recognition software and may include unintentional dictation errors.    Arta Silence, MD 12/14/16 2149    Arta Silence, MD 12/15/16 0021

## 2016-12-14 NOTE — H&P (Signed)
[facility] [place] NAME: Richard Chapman    MR#:  [id number]  DATE OF BIRTH:  Dec 18, 1940  DATE OF ADMISSION:  12/14/2016  PRIMARY CARE PHYSICIAN: Leonel Ramsay, MD   REQUESTING/REFERRING PHYSICIAN: Cherylann Banas, MD  CHIEF COMPLAINT:   Chief Complaint  Patient presents with  . Weakness    HISTORY OF PRESENT ILLNESS:  Richard Chapman  is a 76 y.o. male who presents with weakness, myalgias.  Patient states that this started earlier today, and that he has been aching all over.  He has had a little bit of a cough.  He also had some sweats earlier today.  Initial workup here in the ED shows patient meets sepsis criteria, but does not elicit a clear source of infection.  Chest x-ray did not show pneumonia, UA was not indicative of UTI.  Cultures have been sent and are pending.  Hospitalist were called for admission.  PAST MEDICAL HISTORY:   Past Medical History:  Diagnosis Date  . Anxiety and depression   . Depression   . Diabetes ([place])   . Elevated PSA   . GERD (gastroesophageal reflux disease)   . History of nephrolithiasis   . Hypertension   . Skin cancer   . Sleep apnea     PAST SURGICAL HISTORY:   Past Surgical History:  Procedure Laterality Date  . APPENDECTOMY    . BACK SURGERY    . BURR HOLE FOR SUBDURAL HEMATOMA     2015  . KNEE SURGERY Bilateral   . NECK SURGERY    . SHOULDER SURGERY    . TONSILLECTOMY    . WRIST SURGERY      SOCIAL HISTORY:   Social History  Substance Use Topics  . Smoking status: Current Every Day Smoker    Packs/day: 1.00    Types: Cigarettes  . Smokeless tobacco: Never Used  . Alcohol use 1.2 oz/week    2 Standard drinks or equivalent per week    FAMILY HISTORY:   Family History  Problem Relation Age of Onset  . Heart disease Mother   . Heart disease Father   . Diabetes Father   . Stroke Father   . Kidney disease Neg Hx   . Prostate cancer Neg Hx     DRUG ALLERGIES:  No  Known Allergies  MEDICATIONS AT HOME:   Prior to Admission medications   Medication Sig Start Date End Date Taking? Authorizing Provider  amLODipine (NORVASC) 10 MG tablet Take 1 tablet (10 mg total) by mouth daily. 04/07/15  Yes Fritzi Mandes, MD  atorvastatin (LIPITOR) 10 MG tablet Take 5 mg by mouth daily.   Yes [provider]  clopidogrel (PLAVIX) 75 MG tablet Take 1 tablet (75 mg total) by mouth daily. 04/07/15  Yes Fritzi Mandes, MD  docusate sodium (COLACE) 100 MG capsule Take 1 capsule (100 mg total) by mouth 2 (two) times daily as needed for mild constipation. 11/26/14  Yes Gouru, Aruna, MD  escitalopram (LEXAPRO) 20 MG tablet Take 1 tablet (20 mg total) by mouth daily. 11/26/14  Yes Gouru, Illene Silver, MD  esomeprazole (NEXIUM) 20 MG capsule Take 1 capsule (20 mg total) by mouth AC breakfast. 09/24/15  Yes Fritzi Mandes, MD  furosemide (LASIX) 40 MG tablet Take 1 tablet (40 mg total) by mouth daily. 11/26/14  Yes Gouru, Illene Silver, MD  gabapentin (NEURONTIN) 300 MG capsule Take 300 mg by mouth at bedtime.    Yes [provider]  hyoscyamine (LEVBID) 0.375 MG 12 hr tablet Take 0.375 mg by mouth every 12 (twelve) hours.   Yes [provider]  insulin degludec (TRESIBA FLEXTOUCH) 100 UNIT/ML SOPN FlexTouch Pen Inject 28-30 Units into the skin daily at 10 pm. 28 units on the weekdays and 30 units on the weekends   Yes [provider]  isosorbide mononitrate (IMDUR) 60 MG 24 hr tablet Take 1 tablet (60 mg total) by mouth 2 (two) times daily. Patient taking differently: Take 60 mg by mouth daily.  11/26/14  Yes Gouru, Illene Silver, MD  losartan (COZAAR) 100 MG tablet Take 100 mg by mouth daily.    Yes [provider]  magnesium oxide (MAG-OX) 400 MG tablet Take 400 mg by mouth daily.    Yes [provider]  metFORMIN (GLUCOPHAGE) 1000 MG tablet Take 1 tablet (1,000 mg total) by mouth daily with breakfast. Patient taking differently: Take 500 mg by mouth 2 (two)  times daily with a meal.  11/28/14  Yes Gouru, Aruna, MD  nitroGLYCERIN (NITROSTAT) 0.4 MG SL tablet Place 0.4 mg under the tongue every 5 (five) minutes as needed for chest pain.    Yes [provider]  primidone (MYSOLINE) 50 MG tablet Take 1 tablet by mouth 2 (two) times daily. 11/04/14  Yes [provider]  propranolol (INDERAL) 20 MG tablet Take 20 mg by mouth 2 (two) times daily.    Yes [provider]  tamsulosin (FLOMAX) 0.4 MG CAPS capsule Take 0.4 mg by mouth daily.    Yes [provider]  vitamin B-12 (CYANOCOBALAMIN) 1000 MCG tablet Take 1,000 mcg by mouth daily.    Yes [provider]    REVIEW OF SYSTEMS:  Review of Systems  Constitutional: Positive for fever. Negative for chills, malaise/fatigue and weight loss.  HENT: Negative for ear pain, hearing loss and tinnitus.   Eyes: Negative for blurred vision, double vision, pain and redness.  Respiratory: Positive for cough. Negative for hemoptysis and shortness of breath.   Cardiovascular: Negative for chest pain, palpitations, orthopnea and leg swelling.  Gastrointestinal: Negative for abdominal pain, constipation, diarrhea, nausea and vomiting.  Genitourinary: Negative for dysuria, frequency and hematuria.  Musculoskeletal: Positive for myalgias. Negative for back pain, joint pain and neck pain.  Skin:       No acne, rash, or lesions  Neurological: Positive for weakness. Negative for dizziness, tremors and focal weakness.  Endo/Heme/Allergies: Negative for polydipsia. Does not bruise/bleed easily.  Psychiatric/Behavioral: Negative for depression. The patient is not nervous/anxious and does not have insomnia.      VITAL SIGNS:   Vitals:   12/14/16 2100 12/14/16 2130 12/14/16 2200 12/14/16 2206  BP: (!) 159/90 (!) 183/83 (!) 193/93   Pulse:   67 67  Resp: 19 19 (!) 23 20  Temp:      TempSrc:      SpO2:   97% 94%  Weight:      Height:       Wt Readings from Last 3 Encounters:   12/14/16 83.9 kg (185 lb)  06/23/16 83.9 kg (185 lb)  02/25/16 90 kg (198 lb 8 oz)    PHYSICAL EXAMINATION:  Physical Exam  Vitals reviewed. Constitutional: He is oriented to person, place, and time. He appears well-developed and well-nourished. No distress.  HENT:  Head: Normocephalic and atraumatic.  Mouth/Throat: Oropharynx is clear and moist.  Eyes: Pupils are equal, round, and reactive to light. Conjunctivae and EOM are normal. No scleral icterus.  Neck: Normal  range of motion. Neck supple. No JVD present. No thyromegaly present.  Cardiovascular: Normal rate, regular rhythm and intact distal pulses.  Exam reveals no gallop and no friction rub.   No murmur heard. Respiratory: Effort normal and breath sounds normal. No respiratory distress. He has no wheezes. He has no rales.  GI: Soft. Bowel sounds are normal. He exhibits no distension. There is no tenderness.  Musculoskeletal: Normal range of motion. He exhibits no edema.  No arthritis, no gout  Lymphadenopathy:    He has no cervical adenopathy.  Neurological: He is alert and oriented to person, place, and time. No cranial nerve deficit.  No dysarthria, no aphasia  Skin: Skin is warm and dry. No rash noted. No erythema.  Psychiatric: He has a normal mood and affect. His behavior is normal. Judgment and thought content normal.    LABORATORY PANEL:   CBC  Recent Labs Lab 12/14/16 1844  WBC 13.5*  HGB 14.6  HCT 41.8  PLT 143*   ------------------------------------------------------------------------------------------------------------------  Chemistries   Recent Labs Lab 12/14/16 1844  NA 129*  K 3.1*  CL 93*  CO2 26  GLUCOSE 189*  BUN 14  CREATININE 1.08  CALCIUM 8.9  AST 25  ALT 17  ALKPHOS 50  BILITOT 1.1   ------------------------------------------------------------------------------------------------------------------  Cardiac Enzymes  Recent Labs Lab 12/14/16 1844  TROPONINI 0.03*    ------------------------------------------------------------------------------------------------------------------  RADIOLOGY:  Dg Chest Port 1 View  Result Date: 12/14/2016 CLINICAL DATA:  Generalized weakness, body aches and fever today. EXAM: PORTABLE CHEST 1 VIEW COMPARISON:  09/23/2015 and prior radiograph FINDINGS: The cardiomediastinal silhouette is unremarkable. There is no evidence of focal airspace disease, pulmonary edema, suspicious pulmonary nodule/mass, pleural effusion, or pneumothorax. No acute bony abnormalities are identified. Cervical fusion changes again noted. IMPRESSION: No active disease. Electronically Signed   By: Margarette Canada M.D.   On: 12/14/2016 19:10    EKG:   Orders placed or performed during the hospital encounter of 12/14/16  . ED EKG  . ED EKG    IMPRESSION AND PLAN:  Principal Problem:   Sepsis ([place]) -unclear etiology at this time, possibly even viral rather than bacterial, but we will give 1 dose of IV antibiotics upfront and follow-up on cultures, lactic acid is elevated will use IV fluids and trend until within normal limits, patient is hemodynamically stable. Active Problems:   Benign essential HTN -stable, continue home meds   CAD in native artery -home medications   Diabetes mellitus ([place]) -sliding scale insulin with corresponding glucose checks   Gastro-esophageal reflux disease without esophagitis -home dose PPI  All the records are reviewed and case discussed with ED provider. Management plans discussed with the patient and/or family.  DVT PROPHYLAXIS: Subq Lovenox  GI PROPHYLAXIS: PPI  ADMISSION STATUS: Inpatient  CODE STATUS: Full Code Status History    Date Active Date Inactive Code Status Order ID Comments User Context   09/24/2015  5:40 AM 09/24/2015  4:48 PM Full Code [id number]  Harrie Foreman, MD Inpatient   04/05/2015 10:36 AM 04/07/2015  4:54 PM Full Code [id number]  Fritzi Mandes, MD Inpatient   11/25/2014  7:23 AM 11/26/2014   9:01 PM Full Code [id number]  Hillary Bow, MD ED    Advance Directive Documentation     Most Recent Value  Type of Advance Directive  Living will  Pre-existing out of facility DNR order (yellow form or pink MOST form)  -  "MOST" Form in Place?  -  TOTAL TIME TAKING CARE OF THIS PATIENT: 45 minutes.   Jannifer Franklin, Vandell Kun Webster 12/14/2016, 10:45 PM  [employer]  [phone]  CC: Primary care physician; Leonel Ramsay, MD  Note:  This document was prepared using Dragon voice recognition software and may include unintentional dictation errors.

## 2016-12-14 NOTE — ED Notes (Addendum)
Pt transport to [facility] 

## 2016-12-15 ENCOUNTER — Observation Stay: Payer: Medicare Other

## 2016-12-15 DIAGNOSIS — Z8249 Family history of ischemic heart disease and other diseases of the circulatory system: Secondary | ICD-10-CM | POA: Diagnosis not present

## 2016-12-15 DIAGNOSIS — E119 Type 2 diabetes mellitus without complications: Secondary | ICD-10-CM | POA: Diagnosis present

## 2016-12-15 DIAGNOSIS — F329 Major depressive disorder, single episode, unspecified: Secondary | ICD-10-CM | POA: Diagnosis present

## 2016-12-15 DIAGNOSIS — Z85828 Personal history of other malignant neoplasm of skin: Secondary | ICD-10-CM | POA: Diagnosis not present

## 2016-12-15 DIAGNOSIS — I10 Essential (primary) hypertension: Secondary | ICD-10-CM | POA: Diagnosis present

## 2016-12-15 DIAGNOSIS — Z7902 Long term (current) use of antithrombotics/antiplatelets: Secondary | ICD-10-CM | POA: Diagnosis not present

## 2016-12-15 DIAGNOSIS — F17210 Nicotine dependence, cigarettes, uncomplicated: Secondary | ICD-10-CM | POA: Diagnosis present

## 2016-12-15 DIAGNOSIS — F419 Anxiety disorder, unspecified: Secondary | ICD-10-CM | POA: Diagnosis present

## 2016-12-15 DIAGNOSIS — E876 Hypokalemia: Secondary | ICD-10-CM | POA: Diagnosis present

## 2016-12-15 DIAGNOSIS — K5900 Constipation, unspecified: Secondary | ICD-10-CM | POA: Diagnosis present

## 2016-12-15 DIAGNOSIS — I2510 Atherosclerotic heart disease of native coronary artery without angina pectoris: Secondary | ICD-10-CM | POA: Diagnosis present

## 2016-12-15 DIAGNOSIS — Y838 Other surgical procedures as the cause of abnormal reaction of the patient, or of later complication, without mention of misadventure at the time of the procedure: Secondary | ICD-10-CM | POA: Diagnosis present

## 2016-12-15 DIAGNOSIS — N179 Acute kidney failure, unspecified: Secondary | ICD-10-CM | POA: Diagnosis present

## 2016-12-15 DIAGNOSIS — Z7983 Long term (current) use of bisphosphonates: Secondary | ICD-10-CM | POA: Diagnosis not present

## 2016-12-15 DIAGNOSIS — L03221 Cellulitis of neck: Secondary | ICD-10-CM | POA: Diagnosis present

## 2016-12-15 DIAGNOSIS — A400 Sepsis due to streptococcus, group A: Secondary | ICD-10-CM | POA: Diagnosis present

## 2016-12-15 DIAGNOSIS — E871 Hypo-osmolality and hyponatremia: Secondary | ICD-10-CM | POA: Diagnosis present

## 2016-12-15 DIAGNOSIS — Z823 Family history of stroke: Secondary | ICD-10-CM | POA: Diagnosis not present

## 2016-12-15 DIAGNOSIS — G4730 Sleep apnea, unspecified: Secondary | ICD-10-CM | POA: Diagnosis present

## 2016-12-15 DIAGNOSIS — E860 Dehydration: Secondary | ICD-10-CM | POA: Diagnosis present

## 2016-12-15 DIAGNOSIS — R54 Age-related physical debility: Secondary | ICD-10-CM | POA: Diagnosis present

## 2016-12-15 DIAGNOSIS — Z794 Long term (current) use of insulin: Secondary | ICD-10-CM | POA: Diagnosis not present

## 2016-12-15 DIAGNOSIS — L7682 Other postprocedural complications of skin and subcutaneous tissue: Secondary | ICD-10-CM | POA: Diagnosis present

## 2016-12-15 DIAGNOSIS — K219 Gastro-esophageal reflux disease without esophagitis: Secondary | ICD-10-CM | POA: Diagnosis present

## 2016-12-15 DIAGNOSIS — Z66 Do not resuscitate: Secondary | ICD-10-CM | POA: Diagnosis present

## 2016-12-15 DIAGNOSIS — R7881 Bacteremia: Secondary | ICD-10-CM | POA: Diagnosis present

## 2016-12-15 LAB — BLOOD CULTURE ID PANEL (REFLEXED)
ACINETOBACTER BAUMANNII: NOT DETECTED
CANDIDA ALBICANS: NOT DETECTED
CANDIDA GLABRATA: NOT DETECTED
CANDIDA KRUSEI: NOT DETECTED
Candida parapsilosis: NOT DETECTED
Candida tropicalis: NOT DETECTED
ENTEROBACTER CLOACAE COMPLEX: NOT DETECTED
ENTEROCOCCUS SPECIES: NOT DETECTED
ESCHERICHIA COLI: NOT DETECTED
Enterobacteriaceae species: NOT DETECTED
Haemophilus influenzae: NOT DETECTED
Klebsiella oxytoca: NOT DETECTED
Klebsiella pneumoniae: NOT DETECTED
LISTERIA MONOCYTOGENES: NOT DETECTED
Neisseria meningitidis: NOT DETECTED
PSEUDOMONAS AERUGINOSA: NOT DETECTED
Proteus species: NOT DETECTED
STREPTOCOCCUS PNEUMONIAE: NOT DETECTED
STREPTOCOCCUS PYOGENES: DETECTED — AB
Serratia marcescens: NOT DETECTED
Staphylococcus aureus (BCID): NOT DETECTED
Staphylococcus species: NOT DETECTED
Streptococcus agalactiae: NOT DETECTED
Streptococcus species: DETECTED — AB

## 2016-12-15 LAB — RESPIRATORY PANEL BY PCR
ADENOVIRUS-RVPPCR: NOT DETECTED
BORDETELLA PERTUSSIS-RVPCR: NOT DETECTED
CORONAVIRUS 229E-RVPPCR: NOT DETECTED
CORONAVIRUS HKU1-RVPPCR: NOT DETECTED
CORONAVIRUS NL63-RVPPCR: NOT DETECTED
CORONAVIRUS OC43-RVPPCR: NOT DETECTED
Chlamydophila pneumoniae: NOT DETECTED
Influenza A: NOT DETECTED
Influenza B: NOT DETECTED
METAPNEUMOVIRUS-RVPPCR: NOT DETECTED
Mycoplasma pneumoniae: NOT DETECTED
PARAINFLUENZA VIRUS 1-RVPPCR: NOT DETECTED
PARAINFLUENZA VIRUS 2-RVPPCR: NOT DETECTED
Parainfluenza Virus 3: NOT DETECTED
Parainfluenza Virus 4: NOT DETECTED
Respiratory Syncytial Virus: NOT DETECTED
Rhinovirus / Enterovirus: NOT DETECTED

## 2016-12-15 LAB — CBC
HCT: 41.1 % (ref 40.0–52.0)
Hemoglobin: 14.2 g/dL (ref 13.0–18.0)
MCH: 31.8 pg (ref 26.0–34.0)
MCHC: 34.7 g/dL (ref 32.0–36.0)
MCV: 91.8 fL (ref 80.0–100.0)
PLATELETS: 139 10*3/uL — AB (ref 150–440)
RBC: 4.47 MIL/uL (ref 4.40–5.90)
RDW: 15.1 % — AB (ref 11.5–14.5)
WBC: 15.8 10*3/uL — AB (ref 3.8–10.6)

## 2016-12-15 LAB — BASIC METABOLIC PANEL
Anion gap: 10 (ref 5–15)
BUN: 14 mg/dL (ref 6–20)
CO2: 25 mmol/L (ref 22–32)
CREATININE: 1.05 mg/dL (ref 0.61–1.24)
Calcium: 8.5 mg/dL — ABNORMAL LOW (ref 8.9–10.3)
Chloride: 96 mmol/L — ABNORMAL LOW (ref 101–111)
Glucose, Bld: 143 mg/dL — ABNORMAL HIGH (ref 65–99)
Potassium: 3.2 mmol/L — ABNORMAL LOW (ref 3.5–5.1)
Sodium: 131 mmol/L — ABNORMAL LOW (ref 135–145)

## 2016-12-15 LAB — GLUCOSE, CAPILLARY
GLUCOSE-CAPILLARY: 131 mg/dL — AB (ref 65–99)
GLUCOSE-CAPILLARY: 189 mg/dL — AB (ref 65–99)
GLUCOSE-CAPILLARY: 124 mg/dL — AB (ref 65–99)
GLUCOSE-CAPILLARY: 130 mg/dL — AB (ref 65–99)
Glucose-Capillary: 111 mg/dL — ABNORMAL HIGH (ref 65–99)

## 2016-12-15 LAB — TROPONIN I
TROPONIN I: 0.04 ng/mL — AB (ref <0.03)
TROPONIN I: 0.07 ng/mL — AB (ref <0.03)

## 2016-12-15 LAB — LACTIC ACID, PLASMA: LACTIC ACID, VENOUS: 1.2 mmol/L (ref 0.5–1.9)

## 2016-12-15 MED ORDER — ONDANSETRON HCL 4 MG/2ML IJ SOLN
4.0000 mg | Freq: Four times a day (QID) | INTRAMUSCULAR | Status: DC | PRN
  Administered 2016-12-15: 18:00:00 4 mg via INTRAVENOUS
  Filled 2016-12-15: qty 2

## 2016-12-15 MED ORDER — GABAPENTIN 300 MG PO CAPS
300.0000 mg | ORAL_CAPSULE | Freq: Every day | ORAL | Status: DC
  Administered 2016-12-15 – 2016-12-17 (×3): 300 mg via ORAL
  Filled 2016-12-15 (×3): qty 1

## 2016-12-15 MED ORDER — INSULIN ASPART 100 UNIT/ML ~~LOC~~ SOLN
0.0000 [IU] | Freq: Every day | SUBCUTANEOUS | Status: DC
  Administered 2016-12-16: 21:00:00 4 [IU] via SUBCUTANEOUS
  Filled 2016-12-15: qty 1

## 2016-12-15 MED ORDER — PANTOPRAZOLE SODIUM 40 MG PO TBEC
40.0000 mg | DELAYED_RELEASE_TABLET | Freq: Every day | ORAL | Status: DC
  Administered 2016-12-15 – 2016-12-18 (×4): 40 mg via ORAL
  Filled 2016-12-15 (×4): qty 1

## 2016-12-15 MED ORDER — ATORVASTATIN CALCIUM 10 MG PO TABS
5.0000 mg | ORAL_TABLET | Freq: Every day | ORAL | Status: DC
  Administered 2016-12-15 – 2016-12-18 (×4): 5 mg via ORAL
  Filled 2016-12-15 (×4): qty 0.5

## 2016-12-15 MED ORDER — ACETAMINOPHEN 650 MG RE SUPP: 650.0000 mg | Freq: Four times a day (QID) | RECTAL | Status: DC | PRN

## 2016-12-15 MED ORDER — PRIMIDONE 50 MG PO TABS
50.0000 mg | ORAL_TABLET | Freq: Two times a day (BID) | ORAL | Status: DC
  Administered 2016-12-15 – 2016-12-18 (×7): 50 mg via ORAL
  Filled 2016-12-15 (×9): qty 1

## 2016-12-15 MED ORDER — IOPAMIDOL (ISOVUE-300) INJECTION 61%
75.0000 mL | Freq: Once | INTRAVENOUS | Status: AC | PRN
  Administered 2016-12-15: 13:00:00 75 mL via INTRAVENOUS

## 2016-12-15 MED ORDER — ACETAMINOPHEN 325 MG PO TABS
650.0000 mg | ORAL_TABLET | Freq: Four times a day (QID) | ORAL | Status: DC | PRN
  Administered 2016-12-16 – 2016-12-17 (×2): 650 mg via ORAL
  Filled 2016-12-15 (×3): qty 2

## 2016-12-15 MED ORDER — OXYCODONE HCL 5 MG PO TABS
5.0000 mg | ORAL_TABLET | ORAL | Status: DC | PRN
  Administered 2016-12-15 – 2016-12-18 (×12): 5 mg via ORAL
  Filled 2016-12-15 (×13): qty 1

## 2016-12-15 MED ORDER — VANCOMYCIN HCL IN DEXTROSE 1-5 GM/200ML-% IV SOLN
1000.0000 mg | Freq: Once | INTRAVENOUS | Status: AC
  Administered 2016-12-15: 1000 mg via INTRAVENOUS
  Filled 2016-12-15: qty 200

## 2016-12-15 MED ORDER — INSULIN ASPART 100 UNIT/ML ~~LOC~~ SOLN
0.0000 [IU] | Freq: Three times a day (TID) | SUBCUTANEOUS | Status: DC
  Administered 2016-12-15 (×2): 1 [IU] via SUBCUTANEOUS
  Administered 2016-12-15: 2 [IU] via SUBCUTANEOUS
  Administered 2016-12-16: 7 [IU] via SUBCUTANEOUS
  Administered 2016-12-16: 13:00:00 2 [IU] via SUBCUTANEOUS
  Administered 2016-12-17: 5 [IU] via SUBCUTANEOUS
  Administered 2016-12-17: 7 [IU] via SUBCUTANEOUS
  Administered 2016-12-17: 2 [IU] via SUBCUTANEOUS
  Administered 2016-12-18: 9 [IU] via SUBCUTANEOUS
  Administered 2016-12-18: 1 [IU] via SUBCUTANEOUS
  Filled 2016-12-15 (×10): qty 1

## 2016-12-15 MED ORDER — FUROSEMIDE 40 MG PO TABS
40.0000 mg | ORAL_TABLET | Freq: Every day | ORAL | Status: DC
  Administered 2016-12-15 – 2016-12-18 (×4): 40 mg via ORAL
  Filled 2016-12-15 (×4): qty 1

## 2016-12-15 MED ORDER — ISOSORBIDE MONONITRATE ER 30 MG PO TB24
60.0000 mg | ORAL_TABLET | Freq: Two times a day (BID) | ORAL | Status: DC
  Administered 2016-12-15 – 2016-12-18 (×7): 60 mg via ORAL
  Filled 2016-12-15 (×7): qty 2

## 2016-12-15 MED ORDER — HYDRALAZINE HCL 20 MG/ML IJ SOLN
10.0000 mg | Freq: Four times a day (QID) | INTRAMUSCULAR | Status: DC | PRN
  Administered 2016-12-17 – 2016-12-18 (×2): 10 mg via INTRAVENOUS
  Filled 2016-12-15 (×2): qty 1

## 2016-12-15 MED ORDER — AMLODIPINE BESYLATE 10 MG PO TABS
10.0000 mg | ORAL_TABLET | Freq: Every day | ORAL | Status: DC
  Administered 2016-12-15 – 2016-12-18 (×4): 10 mg via ORAL
  Filled 2016-12-15 (×4): qty 1

## 2016-12-15 MED ORDER — SENNOSIDES-DOCUSATE SODIUM 8.6-50 MG PO TABS
1.0000 | ORAL_TABLET | Freq: Every day | ORAL | Status: DC
  Administered 2016-12-15 – 2016-12-17 (×3): 1 via ORAL
  Filled 2016-12-15 (×3): qty 1

## 2016-12-15 MED ORDER — PROPRANOLOL HCL 20 MG PO TABS
20.0000 mg | ORAL_TABLET | Freq: Two times a day (BID) | ORAL | Status: DC
  Administered 2016-12-15 – 2016-12-18 (×7): 20 mg via ORAL
  Filled 2016-12-15 (×8): qty 1

## 2016-12-15 MED ORDER — TAMSULOSIN HCL 0.4 MG PO CAPS
0.4000 mg | ORAL_CAPSULE | Freq: Every day | ORAL | Status: DC
  Administered 2016-12-15 – 2016-12-18 (×4): 0.4 mg via ORAL
  Filled 2016-12-15 (×4): qty 1

## 2016-12-15 MED ORDER — ENOXAPARIN SODIUM 40 MG/0.4ML ~~LOC~~ SOLN
40.0000 mg | SUBCUTANEOUS | Status: DC
  Administered 2016-12-15 – 2016-12-17 (×3): 40 mg via SUBCUTANEOUS
  Filled 2016-12-15 (×3): qty 0.4

## 2016-12-15 MED ORDER — DEXTROSE 5 % IV SOLN
1.0000 g | Freq: Once | INTRAVENOUS | Status: AC
  Administered 2016-12-15: 02:00:00 1 g via INTRAVENOUS
  Filled 2016-12-15: qty 1

## 2016-12-15 MED ORDER — LOSARTAN POTASSIUM 50 MG PO TABS
100.0000 mg | ORAL_TABLET | Freq: Every day | ORAL | Status: DC
  Administered 2016-12-15 – 2016-12-18 (×4): 100 mg via ORAL
  Filled 2016-12-15 (×4): qty 2

## 2016-12-15 MED ORDER — POTASSIUM CHLORIDE 20 MEQ PO PACK
40.0000 meq | PACK | Freq: Once | ORAL | Status: AC
  Administered 2016-12-15: 40 meq via ORAL
  Filled 2016-12-15: qty 2

## 2016-12-15 MED ORDER — ONDANSETRON HCL 4 MG PO TABS: 4.0000 mg | ORAL_TABLET | Freq: Four times a day (QID) | ORAL | Status: DC | PRN

## 2016-12-15 MED ORDER — SODIUM CHLORIDE 0.9 % IV SOLN
3.0000 g | Freq: Four times a day (QID) | INTRAVENOUS | Status: DC
  Administered 2016-12-15 – 2016-12-18 (×13): 3 g via INTRAVENOUS
  Filled 2016-12-15 (×17): qty 3

## 2016-12-15 MED ORDER — SODIUM CHLORIDE 0.9 % IV SOLN
INTRAVENOUS | Status: AC
  Administered 2016-12-15: 02:00:00 via INTRAVENOUS

## 2016-12-15 MED ORDER — ESCITALOPRAM OXALATE 10 MG PO TABS
20.0000 mg | ORAL_TABLET | Freq: Every day | ORAL | Status: DC
  Administered 2016-12-15 – 2016-12-18 (×4): 20 mg via ORAL
  Filled 2016-12-15 (×4): qty 2

## 2016-12-15 MED ORDER — CLOPIDOGREL BISULFATE 75 MG PO TABS
75.0000 mg | ORAL_TABLET | Freq: Every day | ORAL | Status: DC
  Administered 2016-12-15 – 2016-12-18 (×4): 75 mg via ORAL
  Filled 2016-12-15 (×4): qty 1

## 2016-12-15 NOTE — Consult Note (Signed)
[facility]ious [facility]     Reason for Consult: Dolores Frame  Referring Physician: Bacteremia, Cellulitis Date of Admission:  12/14/2016   Principal Problem:   Sepsis ([place]) Active Problems:   Benign essential HTN   CAD in native artery   Diabetes mellitus ([place])   Gastro-esophageal reflux disease without esophagitis   HPI: Kazuki Gene Gomillion is a 76 y.o. male admitted with weakness, fevers, chills and some pain in L face where he had a recent bxp done for skin cancer. TWBC was 15 temp 100.2.  The area was noted to be red and painful. Started IV Abx.  BCX positive strep pyogenes. CT done and shows no abscess just cellulitis changes in L neck and [place] fossa. Clinically he is improving.   Past Medical History:  Diagnosis Date  . Anxiety and depression   . Depression   . Diabetes ([place])   . Elevated PSA   . GERD (gastroesophageal reflux disease)   . History of nephrolithiasis   . Hypertension   . Skin cancer   . Sleep apnea    Past Surgical History:  Procedure Laterality Date  . APPENDECTOMY    . BACK SURGERY    . BURR HOLE FOR SUBDURAL HEMATOMA     2015  . KNEE SURGERY Bilateral   . NECK SURGERY    . SHOULDER SURGERY    . TONSILLECTOMY    . WRIST SURGERY     Social History  Substance Use Topics  . Smoking status: Current Every Day Smoker    Packs/day: 1.00    Types: Cigarettes  . Smokeless tobacco: Never Used  . Alcohol use 1.2 oz/week    2 Standard drinks or equivalent per week   Family History  Problem Relation Age of Onset  . Heart disease Mother   . Heart disease Father   . Diabetes Father   . Stroke Father   . Kidney disease Neg Hx   . Prostate cancer Neg Hx     Allergies: No Known Allergies  Current antibiotics: Antibiotics Given (last 72 hours)    Date/Time Action Medication Dose Rate   12/15/16 0211 New Bag/Given   vancomycin (VANCOCIN) IVPB 1000 mg/200 mL premix 1,000 mg 200 mL/hr   12/15/16 0211 New Bag/Given   ceFEPIme (MAXIPIME) 1 g in  dextrose 5 % 50 mL IVPB 1 g 100 mL/hr   12/15/16 1331 New Bag/Given   Ampicillin-Sulbactam (UNASYN) 3 g in sodium chloride 0.9 % 100 mL IVPB 3 g 200 mL/hr      MEDICATIONS: . amLODipine  10 mg Oral Daily  . atorvastatin  5 mg Oral Daily  . clopidogrel  75 mg Oral Daily  . enoxaparin (LOVENOX) injection  40 mg Subcutaneous Q24H  . escitalopram  20 mg Oral Daily  . furosemide  40 mg Oral Daily  . gabapentin  300 mg Oral QHS  . insulin aspart  0-5 Units Subcutaneous QHS  . insulin aspart  0-9 Units Subcutaneous TID WC  . isosorbide mononitrate  60 mg Oral BID  . losartan  100 mg Oral Daily  . pantoprazole  40 mg Oral Daily  . primidone  50 mg Oral BID  . propranolol  20 mg Oral BID  . senna-docusate  1 tablet Oral QHS  . tamsulosin  0.4 mg Oral Daily    Review of Systems - 11 systems reviewed and negative per HPI   OBJECTIVE: Temp:  [98.4 F (36.9 C)-100.2 F (37.9 C)] 98.9 F (37.2 C) (11/02  1425) Pulse Rate:  [67-85] 75 (11/02 1425) Resp:  [16-25] 17 (11/02 0528) BP: (136-193)/(63-93) 176/63 (11/02 1425) SpO2:  [94 %-100 %] 95 % (11/02 1425) Weight:  [82.9 kg (182 lb 12.2 oz)-83.9 kg (185 lb)] 82.9 kg (182 lb 12.2 oz) (11/02 0500) Physical Exam  Constitutional: He is oriented to person, place, and time. He appears well-developed and well-nourished. Frail  HENT: anicteric Mouth/Throat: Oropharynx is clear and moist. No oropharyngeal exudate.  L neck with swelling and induration, no open wounds Cardiovascular: Normal rate, regular rhythm and normal heart sounds. Pulmonary/Chest: Effort normal and breath sounds normal. No respiratory distress. He has no wheezes.  Abdominal: Soft. Bowel sounds are normal. He exhibits no distension. There is no tenderness.  Lymphadenopathy: He has no cervical adenopathy.  Neurological: He is alert and oriented to person, place, and time.  Skin: Skin is warm and dry. No rash noted. No erythema.  Psychiatric: He has a normal mood and affect.  His behavior is normal.     LABS: Results for orders placed or performed during the hospital encounter of 12/14/16 (from the past 48 hour(s))  Lactic acid, plasma     Status: Abnormal   Collection Time: 12/14/16  6:44 PM  Result Value Ref Range   Lactic Acid, Venous 2.8 (HH) 0.5 - 1.9 mmol/L    Comment: CRITICAL RESULT CALLED TO, READ BACK BY AND VERIFIED WITH SHERRY MAYHAN AT 1948 12/14/2016.  TFK   Comprehensive metabolic panel     Status: Abnormal   Collection Time: 12/14/16  6:44 PM  Result Value Ref Range   Sodium 129 (L) 135 - 145 mmol/L   Potassium 3.1 (L) 3.5 - 5.1 mmol/L   Chloride 93 (L) 101 - 111 mmol/L   CO2 26 22 - 32 mmol/L   Glucose, Bld 189 (H) 65 - 99 mg/dL   BUN 14 6 - 20 mg/dL   Creatinine, Ser 1.08 0.61 - 1.24 mg/dL   Calcium 8.9 8.9 - 10.3 mg/dL   Total Protein 6.6 6.5 - 8.1 g/dL   Albumin 3.5 3.5 - 5.0 g/dL   AST 25 15 - 41 U/L   ALT 17 17 - 63 U/L   Alkaline Phosphatase 50 38 - 126 U/L   Total Bilirubin 1.1 0.3 - 1.2 mg/dL   GFR calc non Af Amer >60 >60 mL/min   GFR calc Af Amer >60 >60 mL/min    Comment: (NOTE) The eGFR has been calculated using the CKD EPI equation. This calculation has not been validated in all clinical situations. eGFR's persistently <60 mL/min signify possible Chronic Kidney Disease.    Anion gap 10 5 - 15  Troponin I     Status: Abnormal   Collection Time: 12/14/16  6:44 PM  Result Value Ref Range   Troponin I 0.03 (HH) <0.03 ng/mL    Comment: CRITICAL RESULT CALLED TO, READ BACK BY AND VERIFIED WITH SHERRY MAYHAN AT 1938 12/14/2016 BY TFK.   Blood Culture (routine x 2)     Status: None (Preliminary result)   Collection Time: 12/14/16  6:44 PM  Result Value Ref Range   Specimen Description BLOOD BLOOD RIGHT HAND    Special Requests      BOTTLES DRAWN AEROBIC AND ANAEROBIC Blood Culture adequate volume   Culture  Setup Time      Organism ID to follow GRAM POSITIVE COCCI IN BOTH AEROBIC AND ANAEROBIC BOTTLES CRITICAL  RESULT CALLED TO, READ BACK BY AND VERIFIED WITH: HANK ZOMPA ON 12/15/16 AT  1054 QSD    Culture GRAM POSITIVE COCCI    Report Status PENDING   Blood Culture (routine x 2)     Status: None (Preliminary result)   Collection Time: 12/14/16  6:44 PM  Result Value Ref Range   Specimen Description BLOOD BLOOD LEFT FOREARM    Special Requests      BOTTLES DRAWN AEROBIC AND ANAEROBIC Blood Culture results may not be optimal due to an excessive volume of blood received in culture bottles   Culture NO GROWTH < 12 HOURS    Report Status PENDING   CBC WITH DIFFERENTIAL     Status: Abnormal   Collection Time: 12/14/16  6:44 PM  Result Value Ref Range   WBC 13.5 (H) 3.8 - 10.6 K/uL   RBC 4.55 4.40 - 5.90 MIL/uL   Hemoglobin 14.6 13.0 - 18.0 g/dL   HCT 41.8 40.0 - 52.0 %   MCV 91.9 80.0 - 100.0 fL   MCH 32.1 26.0 - 34.0 pg   MCHC 35.0 32.0 - 36.0 g/dL   RDW 14.7 (H) 11.5 - 14.5 %   Platelets 143 (L) 150 - 440 K/uL   Neutrophils Relative % 83 %   Neutro Abs 11.1 (H) 1.4 - 6.5 K/uL   Lymphocytes Relative 11 %   Lymphs Abs 1.5 1.0 - 3.6 K/uL   Monocytes Relative 6 %   Monocytes Absolute 0.8 0.2 - 1.0 K/uL   Eosinophils Relative 0 %   Eosinophils Absolute 0.0 0 - 0.7 K/uL   Basophils Relative 0 %   Basophils Absolute 0.0 0 - 0.1 K/uL  Urinalysis, Routine w reflex microscopic     Status: Abnormal   Collection Time: 12/14/16  6:44 PM  Result Value Ref Range   Color, Urine YELLOW (A) YELLOW   APPearance CLEAR (A) CLEAR   Specific Gravity, Urine 1.012 1.005 - 1.030   pH 7.0 5.0 - 8.0   Glucose, UA 150 (A) NEGATIVE mg/dL   Hgb urine dipstick NEGATIVE NEGATIVE   Bilirubin Urine NEGATIVE NEGATIVE   Ketones, ur NEGATIVE NEGATIVE mg/dL   Protein, ur 100 (A) NEGATIVE mg/dL   Nitrite NEGATIVE NEGATIVE   Leukocytes, UA NEGATIVE NEGATIVE   RBC / HPF 0-5 0 - 5 RBC/hpf   WBC, UA 0-5 0 - 5 WBC/hpf   Bacteria, UA NONE SEEN NONE SEEN   Squamous Epithelial / LPF NONE SEEN NONE SEEN  CK     Status:  Abnormal   Collection Time: 12/14/16  6:44 PM  Result Value Ref Range   Total CK 39 (L) 49 - 397 U/L  Blood Culture ID Panel (Reflexed)     Status: Abnormal   Collection Time: 12/14/16  6:44 PM  Result Value Ref Range   Enterococcus species NOT DETECTED NOT DETECTED   Listeria monocytogenes NOT DETECTED NOT DETECTED   Staphylococcus species NOT DETECTED NOT DETECTED   Staphylococcus aureus NOT DETECTED NOT DETECTED   Streptococcus species DETECTED (A) NOT DETECTED    Comment: CRITICAL RESULT CALLED TO, READ BACK BY AND VERIFIED WITH: HANK ZOMPA ON 12/15/16 AT 1054 QSD    Streptococcus agalactiae NOT DETECTED NOT DETECTED   Streptococcus pneumoniae NOT DETECTED NOT DETECTED   Streptococcus pyogenes DETECTED (A) NOT DETECTED    Comment: CRITICAL RESULT CALLED TO, READ BACK BY AND VERIFIED WITH: HANK ZOMPA ON 12/15/16 AT 1054 QSD    Acinetobacter baumannii NOT DETECTED NOT DETECTED   Enterobacteriaceae species NOT DETECTED NOT DETECTED   Enterobacter cloacae complex NOT DETECTED  NOT DETECTED   Escherichia coli NOT DETECTED NOT DETECTED   Klebsiella oxytoca NOT DETECTED NOT DETECTED   Klebsiella pneumoniae NOT DETECTED NOT DETECTED   Proteus species NOT DETECTED NOT DETECTED   Serratia marcescens NOT DETECTED NOT DETECTED   Haemophilus influenzae NOT DETECTED NOT DETECTED   Neisseria meningitidis NOT DETECTED NOT DETECTED   Pseudomonas aeruginosa NOT DETECTED NOT DETECTED   Candida albicans NOT DETECTED NOT DETECTED   Candida glabrata NOT DETECTED NOT DETECTED   Candida krusei NOT DETECTED NOT DETECTED   Candida parapsilosis NOT DETECTED NOT DETECTED   Candida tropicalis NOT DETECTED NOT DETECTED  Influenza panel by PCR (type A & B)     Status: None   Collection Time: 12/14/16  7:03 PM  Result Value Ref Range   Influenza A By PCR NEGATIVE NEGATIVE   Influenza B By PCR NEGATIVE NEGATIVE    Comment: (NOTE) The Xpert Xpress Flu assay is intended as an aid in the diagnosis of   influenza and should not be used as a sole basis for treatment.  This  assay is FDA approved for nasopharyngeal swab specimens only. Nasal  washings and aspirates are unacceptable for Xpert Xpress Flu testing.   Urinalysis, Complete w Microscopic     Status: Abnormal   Collection Time: 12/14/16  8:37 PM  Result Value Ref Range   Color, Urine YELLOW YELLOW   APPearance CLEAR CLEAR   Specific Gravity, Urine 1.015 1.005 - 1.030   pH 7.0 5.0 - 8.0   Glucose, UA 100 (A) NEGATIVE mg/dL   Hgb urine dipstick TRACE (A) NEGATIVE   Bilirubin Urine NEGATIVE NEGATIVE   Ketones, ur NEGATIVE NEGATIVE mg/dL   Protein, ur 100 (A) NEGATIVE mg/dL   Nitrite NEGATIVE NEGATIVE   Leukocytes, UA NEGATIVE NEGATIVE   Squamous Epithelial / LPF 0-5 (A) NONE SEEN   WBC, UA 0-5 0 - 5 WBC/hpf   RBC / HPF 0-5 0 - 5 RBC/hpf   Bacteria, UA NONE SEEN NONE SEEN  Respiratory Panel by PCR     Status: None   Collection Time: 12/14/16 10:50 PM  Result Value Ref Range   Adenovirus NOT DETECTED NOT DETECTED   Coronavirus 229E NOT DETECTED NOT DETECTED   Coronavirus HKU1 NOT DETECTED NOT DETECTED   Coronavirus NL63 NOT DETECTED NOT DETECTED   Coronavirus OC43 NOT DETECTED NOT DETECTED   Metapneumovirus NOT DETECTED NOT DETECTED   Rhinovirus / Enterovirus NOT DETECTED NOT DETECTED   Influenza A NOT DETECTED NOT DETECTED   Influenza B NOT DETECTED NOT DETECTED   Parainfluenza Virus 1 NOT DETECTED NOT DETECTED   Parainfluenza Virus 2 NOT DETECTED NOT DETECTED   Parainfluenza Virus 3 NOT DETECTED NOT DETECTED   Parainfluenza Virus 4 NOT DETECTED NOT DETECTED   Respiratory Syncytial Virus NOT DETECTED NOT DETECTED   Bordetella pertussis NOT DETECTED NOT DETECTED   Chlamydophila pneumoniae NOT DETECTED NOT DETECTED   Mycoplasma pneumoniae NOT DETECTED NOT DETECTED    Comment: Performed at [facility], [place] [address], [place], [place] [address]  Troponin I     Status: Abnormal   Collection Time: 12/15/16  1:05 AM   Result Value Ref Range   Troponin I 0.04 (HH) <0.03 ng/mL    Comment: CRITICAL VALUE NOTED. VALUE IS CONSISTENT WITH PREVIOUSLY REPORTED/CALLED VALUE ALV  Glucose, capillary     Status: Abnormal   Collection Time: 12/15/16  1:28 AM  Result Value Ref Range   Glucose-Capillary 111 (H) 65 - 99  mg/dL  Basic metabolic panel     Status: Abnormal   Collection Time: 12/15/16  2:22 AM  Result Value Ref Range   Sodium 131 (L) 135 - 145 mmol/L   Potassium 3.2 (L) 3.5 - 5.1 mmol/L   Chloride 96 (L) 101 - 111 mmol/L   CO2 25 22 - 32 mmol/L   Glucose, Bld 143 (H) 65 - 99 mg/dL   BUN 14 6 - 20 mg/dL   Creatinine, Ser 1.05 0.61 - 1.24 mg/dL   Calcium 8.5 (L) 8.9 - 10.3 mg/dL   GFR calc non Af Amer >60 >60 mL/min   GFR calc Af Amer >60 >60 mL/min    Comment: (NOTE) The eGFR has been calculated using the CKD EPI equation. This calculation has not been validated in all clinical situations. eGFR's persistently <60 mL/min signify possible Chronic Kidney Disease.    Anion gap 10 5 - 15  CBC     Status: Abnormal   Collection Time: 12/15/16  2:22 AM  Result Value Ref Range   WBC 15.8 (H) 3.8 - 10.6 K/uL   RBC 4.47 4.40 - 5.90 MIL/uL   Hemoglobin 14.2 13.0 - 18.0 g/dL   HCT 41.1 40.0 - 52.0 %   MCV 91.8 80.0 - 100.0 fL   MCH 31.8 26.0 - 34.0 pg   MCHC 34.7 32.0 - 36.0 g/dL   RDW 15.1 (H) 11.5 - 14.5 %   Platelets 139 (L) 150 - 440 K/uL  Lactic acid, plasma     Status: None   Collection Time: 12/15/16  2:22 AM  Result Value Ref Range   Lactic Acid, Venous 1.2 0.5 - 1.9 mmol/L  Troponin I     Status: Abnormal   Collection Time: 12/15/16  6:50 AM  Result Value Ref Range   Troponin I 0.07 (HH) <0.03 ng/mL    Comment: CRITICAL VALUE NOTED. VALUE IS CONSISTENT WITH PREVIOUSLY REPORTED/CALLED VALUE DAS  Glucose, capillary     Status: Abnormal   Collection Time: 12/15/16  7:40 AM  Result Value Ref Range   Glucose-Capillary 131 (H) 65 - 99 mg/dL  Glucose, capillary     Status: Abnormal    Collection Time: 12/15/16 11:45 AM  Result Value Ref Range   Glucose-Capillary 189 (H) 65 - 99 mg/dL   No components found for: ESR, C REACTIVE PROTEIN MICRO: Recent Results (from the past 720 hour(s))  Blood Culture (routine x 2)     Status: None (Preliminary result)   Collection Time: 12/14/16  6:44 PM  Result Value Ref Range Status   Specimen Description BLOOD BLOOD RIGHT HAND  Final   Special Requests   Final    BOTTLES DRAWN AEROBIC AND ANAEROBIC Blood Culture adequate volume   Culture  Setup Time   Final    Organism ID to follow GRAM POSITIVE COCCI IN BOTH AEROBIC AND ANAEROBIC BOTTLES CRITICAL RESULT CALLED TO, READ BACK BY AND VERIFIED WITH: HANK ZOMPA ON 12/15/16 AT 1054 QSD    Culture GRAM POSITIVE COCCI  Final   Report Status PENDING  Incomplete  Blood Culture (routine x 2)     Status: None (Preliminary result)   Collection Time: 12/14/16  6:44 PM  Result Value Ref Range Status   Specimen Description BLOOD BLOOD LEFT FOREARM  Final   Special Requests   Final    BOTTLES DRAWN AEROBIC AND ANAEROBIC Blood Culture results may not be optimal due to an excessive volume of blood received in culture bottles   Culture  NO GROWTH < 12 HOURS  Final   Report Status PENDING  Incomplete  Blood Culture ID Panel (Reflexed)     Status: Abnormal   Collection Time: 12/14/16  6:44 PM  Result Value Ref Range Status   Enterococcus species NOT DETECTED NOT DETECTED Final   Listeria monocytogenes NOT DETECTED NOT DETECTED Final   Staphylococcus species NOT DETECTED NOT DETECTED Final   Staphylococcus aureus NOT DETECTED NOT DETECTED Final   Streptococcus species DETECTED (A) NOT DETECTED Final    Comment: CRITICAL RESULT CALLED TO, READ BACK BY AND VERIFIED WITH: HANK ZOMPA ON 12/15/16 AT 1054 QSD    Streptococcus agalactiae NOT DETECTED NOT DETECTED Final   Streptococcus pneumoniae NOT DETECTED NOT DETECTED Final   Streptococcus pyogenes DETECTED (A) NOT DETECTED Final    Comment:  CRITICAL RESULT CALLED TO, READ BACK BY AND VERIFIED WITH: HANK ZOMPA ON 12/15/16 AT 1054 QSD    Acinetobacter baumannii NOT DETECTED NOT DETECTED Final   Enterobacteriaceae species NOT DETECTED NOT DETECTED Final   Enterobacter cloacae complex NOT DETECTED NOT DETECTED Final   Escherichia coli NOT DETECTED NOT DETECTED Final   Klebsiella oxytoca NOT DETECTED NOT DETECTED Final   Klebsiella pneumoniae NOT DETECTED NOT DETECTED Final   Proteus species NOT DETECTED NOT DETECTED Final   Serratia marcescens NOT DETECTED NOT DETECTED Final   Haemophilus influenzae NOT DETECTED NOT DETECTED Final   Neisseria meningitidis NOT DETECTED NOT DETECTED Final   Pseudomonas aeruginosa NOT DETECTED NOT DETECTED Final   Candida albicans NOT DETECTED NOT DETECTED Final   Candida glabrata NOT DETECTED NOT DETECTED Final   Candida krusei NOT DETECTED NOT DETECTED Final   Candida parapsilosis NOT DETECTED NOT DETECTED Final   Candida tropicalis NOT DETECTED NOT DETECTED Final  Respiratory Panel by PCR     Status: None   Collection Time: 12/14/16 10:50 PM  Result Value Ref Range Status   Adenovirus NOT DETECTED NOT DETECTED Final   Coronavirus 229E NOT DETECTED NOT DETECTED Final   Coronavirus HKU1 NOT DETECTED NOT DETECTED Final   Coronavirus NL63 NOT DETECTED NOT DETECTED Final   Coronavirus OC43 NOT DETECTED NOT DETECTED Final   Metapneumovirus NOT DETECTED NOT DETECTED Final   Rhinovirus / Enterovirus NOT DETECTED NOT DETECTED Final   Influenza A NOT DETECTED NOT DETECTED Final   Influenza B NOT DETECTED NOT DETECTED Final   Parainfluenza Virus 1 NOT DETECTED NOT DETECTED Final   Parainfluenza Virus 2 NOT DETECTED NOT DETECTED Final   Parainfluenza Virus 3 NOT DETECTED NOT DETECTED Final   Parainfluenza Virus 4 NOT DETECTED NOT DETECTED Final   Respiratory Syncytial Virus NOT DETECTED NOT DETECTED Final   Bordetella pertussis NOT DETECTED NOT DETECTED Final   Chlamydophila pneumoniae NOT DETECTED  NOT DETECTED Final   Mycoplasma pneumoniae NOT DETECTED NOT DETECTED Final    Comment: Performed at [facility], [address], [place], [place] [address]    IMAGING: Ct Soft Tissue Neck W Contrast  Result Date: 12/15/2016 CLINICAL DATA:  Neck hematoma, traumatic. Patient has left-sided neck biopsy month ago and is experiencing pain and tenderness. EXAM: CT NECK WITH CONTRAST TECHNIQUE: Multidetector CT imaging of the neck was performed using the standard protocol following the bolus administration of intravenous contrast. CONTRAST:  50m ISOVUE-300 IOPAMIDOL (ISOVUE-300) INJECTION 61% COMPARISON:  CTA neck 04/05/2015. FINDINGS: Pharynx and larynx: No evidence of inflammation or mass. Salivary glands: Stranding around the tail of the left parotid that does not appear primarily inflamed. There is  a lobulated high-density nodular focus in the left parotid tail which is likely enlarged lymph nodes, the larger measuring up to 13 mm long axis. Smaller nodes were same in the in the same location on comparison CTA. Thyroid: 3.3 cm right thyroid nodule.  This was biopsied in 2017. Lymph nodes: Probable reactive nodes in the left parotid tail, as above. Otherwise negative. Vascular: Atherosclerotic calcification at the left common carotid bifurcation. No acute finding. Limited intracranial: Negative Visualized orbits: Minimally covered and negative Mastoids and visualized paranasal sinuses: Under aerated mastoid on the left. Air cells are currently patent. Skeleton: Biparietal bur holes on scout images. C3-4 discectomy with cage. C5-6 and C6-7 ACDF with ventral plate. Bulky thoracic spondylosis. Upper chest: No acute finding when allowing for motion. Other: There is skin and subcutaneous fat reticulation along the left supraclavicular fossa and left neck. No abscess, soft tissue gas, opaque foreign body. Edema tracks along the superficial left sternocleidomastoid. IMPRESSION: 1. Cellulitic changes in the left  neck and supraclavicular fossa. No abscess or soft tissue gas. 2. Clustered nodular densities in the left parotid tail are likely reactive lymph nodes when compared to 2017 CTA. Suggest clinical surveillance of this area after convalescence. Electronically Signed   By: Monte Fantasia M.D.   On: 12/15/2016 13:06   Dg Chest Port 1 View  Result Date: 12/14/2016 CLINICAL DATA:  Generalized weakness, body aches and fever today. EXAM: PORTABLE CHEST 1 VIEW COMPARISON:  09/23/2015 and prior radiograph FINDINGS: The cardiomediastinal silhouette is unremarkable. There is no evidence of focal airspace disease, pulmonary edema, suspicious pulmonary nodule/mass, pleural effusion, or pneumothorax. No acute bony abnormalities are identified. Cervical fusion changes again noted. IMPRESSION: No active disease. Electronically Signed   By: Margarette Canada M.D.   On: 12/14/2016 19:10    Assessment:   [place] is a 76 y.o. male admitted with fevers, weakness and found to have L neck cellulitis related to prior skin bxp site. BCX + Grp A strep. Clinically improving. CT neg for abscess.  I would suggest not narrowing just to cover the Grp A strep as the infection may be polymicrobial in his neck.  Recommendations Cont unasyn while inpatient Can change to oral augmentin orally for a total 14 day course. I can see within 2 weeks of DC. Thank you very much for allowing me to participate in the care of this patient. Please call with questions.   Cheral Marker. Ola Spurr, MD

## 2016-12-15 NOTE — Progress Notes (Signed)
[facility] [place] NAME: Richard Chapman    MR#:  [id number]  DATE OF BIRTH:  02-Oct-1940  SUBJECTIVE:  CHIEF COMPLAINT:   Chief Complaint  Patient presents with  . Weakness     Came with sepsis symptoms. Had skin biopsy done last week on neck and ear- have some cellulitis around there.  REVIEW OF SYSTEMS:  CONSTITUTIONAL: No fever,positive for fatigue or weakness.  EYES: No blurred or double vision.  EARS, NOSE, AND THROAT: No tinnitus or ear pain.  RESPIRATORY: No cough, shortness of breath, wheezing or hemoptysis.  CARDIOVASCULAR: No chest pain, orthopnea, edema.  GASTROINTESTINAL: No nausea, vomiting, diarrhea or abdominal pain.  GENITOURINARY: No dysuria, hematuria.  ENDOCRINE: No polyuria, nocturia,  HEMATOLOGY: No anemia, easy bruising or bleeding SKIN: No rash or lesion. MUSCULOSKELETAL: No joint pain or arthritis.   NEUROLOGIC: No tingling, numbness, weakness.  PSYCHIATRY: No anxiety or depression.   ROS  DRUG ALLERGIES:  No Known Allergies  VITALS:  Blood pressure (!) 176/63, pulse 75, temperature 98.9 F (37.2 C), temperature source Oral, resp. rate 17, height 5\' 9"  (1.753 m), weight 82.9 kg (182 lb 12.2 oz), SpO2 95 %.  PHYSICAL EXAMINATION:  GENERAL:  76 y.o.-year-old patient lying in the bed with no acute distress.  EYES: Pupils equal, round, reactive to light and accommodation. No scleral icterus. Extraocular muscles intact.  HEENT: Head atraumatic, normocephalic. Oropharynx and nasopharynx clear.  NECK:  Supple, no jugular venous distention. No thyroid enlargement, on left side neck, induration and tenderness.  LUNGS: Normal breath sounds bilaterally, no wheezing, rales,rhonchi or crepitation. No use of accessory muscles of respiration.  CARDIOVASCULAR: S1, S2 normal. No murmurs, rubs, or gallops.  ABDOMEN: Soft, nontender, nondistended. Bowel sounds present. No organomegaly or mass.  EXTREMITIES: No pedal edema,  cyanosis, or clubbing.  NEUROLOGIC: Cranial nerves II through XII are intact. Muscle strength 5/5 in all extremities. Sensation intact. Gait not checked.  PSYCHIATRIC: The patient is alert and oriented x 3.  SKIN: No obvious rash, lesion, or ulcer.   Physical Exam LABORATORY PANEL:   CBC  Recent Labs Lab 12/15/16 0222  WBC 15.8*  HGB 14.2  HCT 41.1  PLT 139*   ------------------------------------------------------------------------------------------------------------------  Chemistries   Recent Labs Lab 12/14/16 1844 12/15/16 0222  NA 129* 131*  K 3.1* 3.2*  CL 93* 96*  CO2 26 25  GLUCOSE 189* 143*  BUN 14 14  CREATININE 1.08 1.05  CALCIUM 8.9 8.5*  AST 25  --   ALT 17  --   ALKPHOS 50  --   BILITOT 1.1  --    ------------------------------------------------------------------------------------------------------------------  Cardiac Enzymes  Recent Labs Lab 12/15/16 0105 12/15/16 0650  TROPONINI 0.04* 0.07*   ------------------------------------------------------------------------------------------------------------------  RADIOLOGY:  Ct Soft Tissue Neck W Contrast  Result Date: 12/15/2016 CLINICAL DATA:  Neck hematoma, traumatic. Patient has left-sided neck biopsy month ago and is experiencing pain and tenderness. EXAM: CT NECK WITH CONTRAST TECHNIQUE: Multidetector CT imaging of the neck was performed using the standard protocol following the bolus administration of intravenous contrast. CONTRAST:  56mL ISOVUE-300 IOPAMIDOL (ISOVUE-300) INJECTION 61% COMPARISON:  CTA neck 04/05/2015. FINDINGS: Pharynx and larynx: No evidence of inflammation or mass. Salivary glands: Stranding around the tail of the left parotid that does not appear primarily inflamed. There is a lobulated high-density nodular focus in the left parotid tail which is likely enlarged lymph nodes, the larger measuring up to 13 mm long axis. Smaller nodes were same in  the in the same location on  comparison CTA. Thyroid: 3.3 cm right thyroid nodule.  This was biopsied in 2017. Lymph nodes: Probable reactive nodes in the left parotid tail, as above. Otherwise negative. Vascular: Atherosclerotic calcification at the left common carotid bifurcation. No acute finding. Limited intracranial: Negative Visualized orbits: Minimally covered and negative Mastoids and visualized paranasal sinuses: Under aerated mastoid on the left. Air cells are currently patent. Skeleton: Biparietal bur holes on scout images. C3-4 discectomy with cage. C5-6 and C6-7 ACDF with ventral plate. Bulky thoracic spondylosis. Upper chest: No acute finding when allowing for motion. Other: There is skin and subcutaneous fat reticulation along the left supraclavicular fossa and left neck. No abscess, soft tissue gas, opaque foreign body. Edema tracks along the superficial left sternocleidomastoid. IMPRESSION: 1. Cellulitic changes in the left neck and supraclavicular fossa. No abscess or soft tissue gas. 2. Clustered nodular densities in the left parotid tail are likely reactive lymph nodes when compared to 2017 CTA. Suggest clinical surveillance of this area after convalescence. Electronically Signed   By: Monte Fantasia M.D.   On: 12/15/2016 13:06   Dg Chest Port 1 View  Result Date: 12/14/2016 CLINICAL DATA:  Generalized weakness, body aches and fever today. EXAM: PORTABLE CHEST 1 VIEW COMPARISON:  09/23/2015 and prior radiograph FINDINGS: The cardiomediastinal silhouette is unremarkable. There is no evidence of focal airspace disease, pulmonary edema, suspicious pulmonary nodule/mass, pleural effusion, or pneumothorax. No acute bony abnormalities are identified. Cervical fusion changes again noted. IMPRESSION: No active disease. Electronically Signed   By: Margarette Canada M.D.   On: 12/14/2016 19:10    ASSESSMENT AND PLAN:   Principal Problem:   Sepsis ([place]) Active Problems:   Benign essential HTN   CAD in native artery   Diabetes  mellitus (HCC)   Gastro-esophageal reflux disease without esophagitis   Bacteremia  *  Sepsis ([place]) - * Bacteremia * Cellulitis on neck   Have group A strep- post skin biopsy on neck last week.   Blood cx positive.   Seen by ID   Agreed on Unasyn for now, and give Augmentin on d/c - total 14 days course.   IV fluids for now.  *  Benign essential HTN -stable, continue home meds * CAD in native artery -home medications *Diabetes mellitus ([place]) -sliding scale insulin with corresponding glucose checks *Gastro-esophageal reflux disease without esophagitis -home dose PPI      All the records are reviewed and case discussed with Care Management/Social Workerr. Management plans discussed with the patient, family and they are in agreement.  CODE STATUS: Full.  TOTAL TIME TAKING CARE OF THIS PATIENT: 35 minutes.   Spoke to wife in room.  POSSIBLE D/C IN 1-2 DAYS, DEPENDING ON CLINICAL CONDITION.   Vaughan Basta M.D on 12/15/2016   Between 7am to 6pm - Pager - [phone]  After 6pm go to [email or web address] - password [email or web address] [facility]  Office  [phone]  CC: Primary care physician; Leonel Ramsay, MD  Note: This dictation was prepared with Dragon dictation along with smaller phrase technology. Any transcriptional errors that result from this process are unintentional.

## 2016-12-15 NOTE — Progress Notes (Signed)
Pharmacy Antibiotic Note  Richard Chapman is a 76 y.o. male admitted on 12/14/2016 with sepsis secondary to bacteremia.  Pharmacy has been consulted for Unasyn dosing.  Plan: Start Unasyn 3g IV every 6 hours based on current CrCl.   Height: 5\' 9"  (175.3 cm) Weight: 182 lb 12.2 oz (82.9 kg) IBW/kg (Calculated) : 70.7  Temp (24hrs), Avg:99.4 F (37.4 C), Min:98.4 F (36.9 C), Max:100.2 F (37.9 C)   Recent Labs Lab 12/14/16 1844 12/15/16 0222  WBC 13.5* 15.8*  CREATININE 1.08 1.05  LATICACIDVEN 2.8* 1.2    Estimated Creatinine Clearance: 58 mL/min (by C-G formula based on SCr of 1.05 mg/dL).    No Known Allergies  Antimicrobials this admission: 11/1 Vanc/Cefepime x 1 dose  11/2 Unasyn >>  Dose adjustments this admission:  Microbiology results: 11/1  BCx: GPC  11/1 BCID: Streptococcus pyogenes  Thank you for allowing pharmacy to be a part of this patient's care.  Pernell Dupre, PharmD, BCPS Clinical Pharmacist 12/15/2016 11:47 AM

## 2016-12-15 NOTE — Care Management Note (Signed)
Case Management Note  Patient Details  Name: Ules Marsala MRN: [id number] Date of Birth: [birth year]  Subjective/Objective:      Admitted to [facility] with the diagnosis of sepsis. Lives with wife, Curly [place] [phone]). Last seen Dr. Ola Spurr 09/15/16. Prescriptions are filled at [facility] in [place] or [facility]. No home Health. Bo skilled facility. No home oxygen. Rolling walker, wheelchair, and cane in the home. Self feed, but needs help with baths and dressing. Last fall was 1-2 years ago. Decreased appetite, unsure about weight loss. Family will transport.              Action/Plan: No follow-up needs identified at this time   Expected Discharge Date:  12/15/16               Expected Discharge Plan:     In-House Referral:     Discharge planning Services     Post Acute Care Choice:    Choice offered to:     DME Arranged:    DME Agency:     HH Arranged:    HH Agency:     Status of Service:     If discussed at [employer], dates discussed:    Additional Comments:  Shelbie Ammons, RN MSN CCM Care Management [phone] 12/15/2016, 11:19 AM

## 2016-12-15 NOTE — Care Management Obs Status (Signed)
[place] NOTIFICATION   Patient Details  Name: Sirius Woodford MRN: [id number] Date of Birth: [birth year]   Medicare Observation Status Notification Given:  Yes    Shelbie Ammons, RN 12/15/2016, 11:18 AM

## 2016-12-15 NOTE — Plan of Care (Signed)
Problem: Safety: Goal: Ability to remain free from injury will improve Wife at bedside alarm on bed in use  Problem: Pain Managment: Goal: General experience of comfort will improve Outcome: Progressing Med given with improvement

## 2016-12-15 NOTE — Progress Notes (Signed)
PHARMACY - PHYSICIAN COMMUNICATION CRITICAL VALUE ALERT - BLOOD CULTURE IDENTIFICATION (BCID)  Results for orders placed or performed during the hospital encounter of 12/14/16  Blood Culture ID Panel (Reflexed) (Collected: 12/14/2016  6:44 PM)  Result Value Ref Range   Enterococcus species NOT DETECTED NOT DETECTED   Listeria monocytogenes NOT DETECTED NOT DETECTED   Staphylococcus species NOT DETECTED NOT DETECTED   Staphylococcus aureus NOT DETECTED NOT DETECTED   Streptococcus species DETECTED (A) NOT DETECTED   Streptococcus agalactiae NOT DETECTED NOT DETECTED   Streptococcus pneumoniae NOT DETECTED NOT DETECTED   Streptococcus pyogenes DETECTED (A) NOT DETECTED   Acinetobacter baumannii NOT DETECTED NOT DETECTED   Enterobacteriaceae species NOT DETECTED NOT DETECTED   Enterobacter cloacae complex NOT DETECTED NOT DETECTED   Escherichia coli NOT DETECTED NOT DETECTED   Klebsiella oxytoca NOT DETECTED NOT DETECTED   Klebsiella pneumoniae NOT DETECTED NOT DETECTED   Proteus species NOT DETECTED NOT DETECTED   Serratia marcescens NOT DETECTED NOT DETECTED   Haemophilus influenzae NOT DETECTED NOT DETECTED   Neisseria meningitidis NOT DETECTED NOT DETECTED   Pseudomonas aeruginosa NOT DETECTED NOT DETECTED   Candida albicans NOT DETECTED NOT DETECTED   Candida glabrata NOT DETECTED NOT DETECTED   Candida krusei NOT DETECTED NOT DETECTED   Candida parapsilosis NOT DETECTED NOT DETECTED   Candida tropicalis NOT DETECTED NOT DETECTED    Name of physician (or Provider) Contacted: Dr. Anselm Jungling  Changes to prescribed antibiotics required: Start [place], PharmD, BCPS Clinical Pharmacist 12/15/2016 11:44 AM

## 2016-12-16 ENCOUNTER — Inpatient Hospital Stay
Admit: 2016-12-16 | Discharge: 2016-12-16 | Disposition: A | Discharge status: Home / Self Care | Payer: Medicare Other | Attending: Internal Medicine | Admitting: Internal Medicine

## 2016-12-16 LAB — BASIC METABOLIC PANEL
Anion gap: 8 (ref 5–15)
BUN: 16 mg/dL (ref 6–20)
CHLORIDE: 95 mmol/L — AB (ref 101–111)
CO2: 28 mmol/L (ref 22–32)
CREATININE: 1.1 mg/dL (ref 0.61–1.24)
Calcium: 8.5 mg/dL — ABNORMAL LOW (ref 8.9–10.3)
Glucose, Bld: 109 mg/dL — ABNORMAL HIGH (ref 65–99)
POTASSIUM: 2.9 mmol/L — AB (ref 3.5–5.1)
SODIUM: 131 mmol/L — AB (ref 135–145)

## 2016-12-16 LAB — CBC
HCT: 41.2 % (ref 40.0–52.0)
HEMOGLOBIN: 14 g/dL (ref 13.0–18.0)
MCH: 31.5 pg (ref 26.0–34.0)
MCHC: 33.9 g/dL (ref 32.0–36.0)
MCV: 92.9 fL (ref 80.0–100.0)
PLATELETS: 121 10*3/uL — AB (ref 150–440)
RBC: 4.44 MIL/uL (ref 4.40–5.90)
RDW: 15.2 % — ABNORMAL HIGH (ref 11.5–14.5)
WBC: 12.5 10*3/uL — AB (ref 3.8–10.6)

## 2016-12-16 LAB — GLUCOSE, CAPILLARY
GLUCOSE-CAPILLARY: 99 mg/dL (ref 65–99)
GLUCOSE-CAPILLARY: 316 mg/dL — AB (ref 65–99)
GLUCOSE-CAPILLARY: 328 mg/dL — AB (ref 65–99)
Glucose-Capillary: 157 mg/dL — ABNORMAL HIGH (ref 65–99)

## 2016-12-16 LAB — ECHOCARDIOGRAM COMPLETE
Height: 69 in
Weight: 2894.4 oz

## 2016-12-16 LAB — MAGNESIUM: MAGNESIUM: 1.5 mg/dL — AB (ref 1.7–2.4)

## 2016-12-16 MED ORDER — POTASSIUM CHLORIDE CRYS ER 20 MEQ PO TBCR
40.0000 meq | EXTENDED_RELEASE_TABLET | Freq: Two times a day (BID) | ORAL | Status: AC
Start: 2016-12-16 — End: 2016-12-16
  Administered 2016-12-16 (×2): 40 meq via ORAL
  Filled 2016-12-16 (×2): qty 2

## 2016-12-16 MED ORDER — DIPHENHYDRAMINE HCL 25 MG PO CAPS
25.0000 mg | ORAL_CAPSULE | Freq: Every evening | ORAL | Status: DC | PRN
  Administered 2016-12-16 – 2016-12-17 (×2): 25 mg via ORAL
  Filled 2016-12-16 (×2): qty 1

## 2016-12-16 MED ORDER — MAGNESIUM SULFATE 2 GM/50ML IV SOLN
2.0000 g | Freq: Once | INTRAVENOUS | Status: AC
  Administered 2016-12-16: 2 g via INTRAVENOUS
  Filled 2016-12-16: qty 50

## 2016-12-16 NOTE — Progress Notes (Signed)
[facility] [place] NAME: Richard Chapman    MR#:  [id number]  DATE OF BIRTH:  1940-06-16  SUBJECTIVE:  CHIEF COMPLAINT:   Chief Complaint  Patient presents with  . Weakness     Came with sepsis symptoms. Had skin biopsy done last week on neck and ear- have some cellulitis around there.  REVIEW OF SYSTEMS:  CONSTITUTIONAL: No fever,positive for fatigue or weakness.  EYES: No blurred or double vision.  EARS, NOSE, AND THROAT: No tinnitus or ear pain.  RESPIRATORY: No cough, shortness of breath, wheezing or hemoptysis.  CARDIOVASCULAR: No chest pain, orthopnea, edema.  GASTROINTESTINAL: No nausea, vomiting, diarrhea or abdominal pain.  GENITOURINARY: No dysuria, hematuria.  ENDOCRINE: No polyuria, nocturia,  HEMATOLOGY: No anemia, easy bruising or bleeding SKIN: No rash or lesion. MUSCULOSKELETAL: No joint pain or arthritis.   NEUROLOGIC: No tingling, numbness, weakness.  PSYCHIATRY: No anxiety or depression.   ROS  DRUG ALLERGIES:  No Known Allergies  VITALS:  Blood pressure (!) 151/69, pulse 61, temperature 98.2 F (36.8 C), temperature source Oral, resp. rate 16, height 5\' 9"  (1.753 m), weight 82.1 kg (180 lb 14.4 oz), SpO2 98 %.  PHYSICAL EXAMINATION:  GENERAL:  76 y.o.-year-old patient lying in the bed with no acute distress.  EYES: Pupils equal, round, reactive to light and accommodation. No scleral icterus. Extraocular muscles intact.  HEENT: Head atraumatic, normocephalic. Oropharynx and nasopharynx clear.  NECK:  Supple, no jugular venous distention. No thyroid enlargement, on left side neck, induration and tenderness.  LUNGS: Normal breath sounds bilaterally, no wheezing, rales,rhonchi or crepitation. No use of accessory muscles of respiration.  CARDIOVASCULAR: S1, S2 normal. No murmurs, rubs, or gallops.  ABDOMEN: Soft, nontender, nondistended. Bowel sounds present. No organomegaly or mass.  EXTREMITIES: No pedal edema,  cyanosis, or clubbing.  NEUROLOGIC: Cranial nerves II through XII are intact. Muscle strength 4/5 in all extremities. Sensation intact. Gait not checked.  PSYCHIATRIC: The patient is alert and oriented x 3.  SKIN: No obvious rash, lesion, or ulcer.   Physical Exam LABORATORY PANEL:   CBC  Recent Labs Lab 12/16/16 0433  WBC 12.5*  HGB 14.0  HCT 41.2  PLT 121*   ------------------------------------------------------------------------------------------------------------------  Chemistries   Recent Labs Lab 12/14/16 1844  12/16/16 0433  NA 129*  < > 131*  K 3.1*  < > 2.9*  CL 93*  < > 95*  CO2 26  < > 28  GLUCOSE 189*  < > 109*  BUN 14  < > 16  CREATININE 1.08  < > 1.10  CALCIUM 8.9  < > 8.5*  MG  --   --  1.5*  AST 25  --   --   ALT 17  --   --   ALKPHOS 50  --   --   BILITOT 1.1  --   --   < > = values in this interval not displayed. ------------------------------------------------------------------------------------------------------------------  Cardiac Enzymes  Recent Labs Lab 12/15/16 0105 12/15/16 0650  TROPONINI 0.04* 0.07*   ------------------------------------------------------------------------------------------------------------------  RADIOLOGY:  Ct Soft Tissue Neck W Contrast  Result Date: 12/15/2016 CLINICAL DATA:  Neck hematoma, traumatic. Patient has left-sided neck biopsy month ago and is experiencing pain and tenderness. EXAM: CT NECK WITH CONTRAST TECHNIQUE: Multidetector CT imaging of the neck was performed using the standard protocol following the bolus administration of intravenous contrast. CONTRAST:  60mL ISOVUE-300 IOPAMIDOL (ISOVUE-300) INJECTION 61% COMPARISON:  CTA neck 04/05/2015. FINDINGS: Pharynx and larynx:  No evidence of inflammation or mass. Salivary glands: Stranding around the tail of the left parotid that does not appear primarily inflamed. There is a lobulated high-density nodular focus in the left parotid tail which is likely  enlarged lymph nodes, the larger measuring up to 13 mm long axis. Smaller nodes were same in the in the same location on comparison CTA. Thyroid: 3.3 cm right thyroid nodule.  This was biopsied in 2017. Lymph nodes: Probable reactive nodes in the left parotid tail, as above. Otherwise negative. Vascular: Atherosclerotic calcification at the left common carotid bifurcation. No acute finding. Limited intracranial: Negative Visualized orbits: Minimally covered and negative Mastoids and visualized paranasal sinuses: Under aerated mastoid on the left. Air cells are currently patent. Skeleton: Biparietal bur holes on scout images. C3-4 discectomy with cage. C5-6 and C6-7 ACDF with ventral plate. Bulky thoracic spondylosis. Upper chest: No acute finding when allowing for motion. Other: There is skin and subcutaneous fat reticulation along the left supraclavicular fossa and left neck. No abscess, soft tissue gas, opaque foreign body. Edema tracks along the superficial left sternocleidomastoid. IMPRESSION: 1. Cellulitic changes in the left neck and supraclavicular fossa. No abscess or soft tissue gas. 2. Clustered nodular densities in the left parotid tail are likely reactive lymph nodes when compared to 2017 CTA. Suggest clinical surveillance of this area after convalescence. Electronically Signed   By: Monte Fantasia M.D.   On: 12/15/2016 13:06    ASSESSMENT AND PLAN:   Principal Problem:   Sepsis ([place]) Active Problems:   Benign essential HTN   CAD in native artery   Diabetes mellitus (HCC)   Gastro-esophageal reflux disease without esophagitis   Bacteremia  *  Sepsis ([place]) - * Bacteremia * Cellulitis on neck   Have group A strep- post skin biopsy on neck last week.   Blood cx positive.   Seen by ID   Agreed on Unasyn for now, and give Augmentin on d/c - total 14 days course.   IV fluids for now.  *  Benign essential HTN -stable, continue home meds * CAD in native artery -home  medications *Diabetes mellitus (HCC) -sliding scale insulin with corresponding glucose checks *Gastro-esophageal reflux disease without esophagitis -home dose PPI * hypokalemia   Check and replace magnesium. * generalized weakness   PT eval.     All the records are reviewed and case discussed with Care Management/Social Workerr. Management plans discussed with the patient, family and they are in agreement.  CODE STATUS: Full.  TOTAL TIME TAKING CARE OF THIS PATIENT: 35 minutes.   Spoke to wife in room.  POSSIBLE D/C IN 1-2 DAYS, DEPENDING ON CLINICAL CONDITION.   Vaughan Basta M.D on 12/16/2016   Between 7am to 6pm - Pager - [phone]  After 6pm go to [email or web address] - password [email or web address] [facility]  Office  [phone]  CC: Primary care physician; Leonel Ramsay, MD  Note: This dictation was prepared with Dragon dictation along with smaller phrase technology. Any transcriptional errors that result from this process are unintentional.

## 2016-12-16 NOTE — Evaluation (Signed)
Physical Therapy Evaluation Patient Details Name: Richard Chapman MRN: [id number] DOB: 27-Oct-1940 Today's Date: 12/16/2016   History of Present Illness  Bj Morlock is a 76yo white male who comes to [facility] on 11/2 c weakness and myalgias, admitted with sepsis, found to have Left Neck cellulitis. PMH: OSA, HTN, DM, GERD, orthopedic surgeries to neck, back, knee, wrist, and shoulder. At baseline, pt has difficulty with household distances with [facility], on occasion will AMB [employer] with a buggy for support. Wife Marcie Bal helps with bathing and dressing.   Clinical Impression  Pt admitted with above diagnosis. Pt currently with functional limitations due to the deficits listed below (see "PT Problem List"). Upon entry, the patient is received semirecumbent in bed, wife(caregiver) present. The pt is awake and agreeable to participate. Pt reports feeling better since admission. The pt is alert and oriented x3, pleasant, conversational, and following simple and multi-step commands consistently, somewhat HOH. Functional mobility assessment demonstrates moderate to heavy strength impairment in BLE, the pt now requiring significantly more effort to perform simple AMB distances in-room, whereas the patient performed these at a higher level of independence PTA. Empirically, the patient demonstrates increased risk of recurrent falls AEB gait speed <0.37m/s, forward reach <5", and multiple LOB demonstrated throughout session. Pt will benefit from skilled PT intervention to increase independence and safety with basic mobility in preparation for discharge to the venue listed below.       Follow Up Recommendations Home health PT;Supervision for mobility/OOB    Equipment Recommendations  None recommended by PT    Recommendations for Other Services       Precautions / Restrictions Precautions Precautions: Fall      Mobility  Bed Mobility Overal bed mobility: Needs Assistance Bed Mobility: Supine to Sit     Supine  to sit: Min guard     General bed mobility comments: moderate effort required   Transfers Overall transfer level: Needs assistance   Transfers: Sit to/from Stand;Stand Pivot Transfers Sit to Stand: Min guard Stand pivot transfers: Min guard       General transfer comment: moderate effort, and heavy BUE assistanc erequired for self stabilization.   Ambulation/Gait Ambulation/Gait assistance: Min guard Ambulation Distance (Feet): 24 Feet Assistive device:  (IV pole)     Gait velocity interpretation: <1.8 ft/sec, indicative of risk for recurrent falls General Gait Details: gait instability, increased perception of weakness in legs and overall difficulty with balance, all worse than baseline; pt with heightened falls anxiety returns to bed in a mildly impulsive way, nearly tripping over IV line.   Stairs            Wheelchair Mobility    Modified Rankin (Stroke Patients Only)       Balance Overall balance assessment: History of Falls;Needs assistance Sitting-balance support: No upper extremity supported;Feet supported Sitting balance-Leahy Scale: Good     Standing balance support: During functional activity;Single extremity supported Standing balance-Leahy Scale: Poor               High level balance activites: Turns               Pertinent Vitals/Pain Pain Assessment: Faces (head and neck pain near his infection site. ) Faces Pain Scale: Hurts little more Pain Intervention(s): Monitored during session    Home Living Family/patient expects to be discharged to:: Private residence Living Arrangements: Spouse/significant other;Children Available Help at Discharge: Family Type of Home: House Home Access: Stairs to enter Entrance Stairs-Rails: Can reach both Entrance Stairs-Number of  Steps: 1 Home Layout: One level Home Equipment: [place] - 2 wheels;Walker - 4 wheels;Cane - single point;Wheelchair - Education officer, community - power (power WC is in disrepair  at this time. )      Prior Function Level of Independence: Needs assistance   Gait / Transfers Assistance Needed: household distances with [facility]  ADL's / Homemaking Assistance Needed: Wife assists with bathing, dressing.   Comments: SOme ETOH related falls >18MA, none in past 3 months.      Hand Dominance        Extremity/Trunk Assessment   Upper Extremity Assessment Upper Extremity Assessment: Generalized weakness    Lower Extremity Assessment Lower Extremity Assessment: Generalized weakness       Communication   Communication: No difficulties  Cognition Arousal/Alertness: Awake/alert Behavior During Therapy: WFL for tasks assessed/performed Overall Cognitive Status: Within Functional Limits for tasks assessed                                        General Comments      Exercises     Assessment/Plan    PT Assessment Patient needs continued PT services  PT Problem List Decreased strength;Decreased balance;Decreased mobility;Pain       PT Treatment Interventions Gait training;Functional mobility training;Therapeutic activities;Therapeutic exercise;Balance training;Patient/family education    PT Goals (Current goals can be found in the Care Plan section)  Acute Rehab PT Goals Patient Stated Goal: reduce gait instability and knee buckling PT Goal Formulation: With patient Time For Goal Achievement: 12/30/16 Potential to Achieve Goals: Fair    Frequency Min 2X/week   Barriers to discharge        Co-evaluation               AM-PAC PT "6 Clicks" Daily Activity  Outcome Measure Difficulty turning over in bed (including adjusting bedclothes, sheets and blankets)?: A Little Difficulty moving from lying on back to sitting on the side of the bed? : A Lot Difficulty sitting down on and standing up from a chair with arms (e.g., wheelchair, bedside commode, etc,.)?: A Lot Help needed moving to and from a bed to chair (including a  wheelchair)?: Total Help needed walking in hospital room?: A Lot Help needed climbing 3-5 steps with a railing? : Total 6 Click Score: 11    End of Session   Activity Tolerance: Patient tolerated treatment well;Patient limited by fatigue Patient left: in chair;with call bell/phone within reach;with family/visitor present Nurse Communication: Mobility status PT Visit Diagnosis: Unsteadiness on feet (R26.81);Muscle weakness (generalized) (M62.81);Difficulty in walking, not elsewhere classified (R26.2)    Time: 5102-5852 PT Time Calculation (min) (ACUTE ONLY): 15 min   Charges:   PT Evaluation $PT Eval Moderate Complexity: 1 Mod     PT G Codes:        1:45 PM, 22-Dec-2016 Etta Grandchild, PT, DPT Physical Therapist - [place] [phone] (Welcome)  [phone] (mobile)   Buccola,Allan C 2016-12-22, 1:41 PM

## 2016-12-17 ENCOUNTER — Other Ambulatory Visit: Payer: Self-pay

## 2016-12-17 LAB — GLUCOSE, CAPILLARY
GLUCOSE-CAPILLARY: 163 mg/dL — AB (ref 65–99)
GLUCOSE-CAPILLARY: 78 mg/dL (ref 65–99)
Glucose-Capillary: 253 mg/dL — ABNORMAL HIGH (ref 65–99)
Glucose-Capillary: 309 mg/dL — ABNORMAL HIGH (ref 65–99)

## 2016-12-17 LAB — CULTURE, BLOOD (ROUTINE X 2): SPECIAL REQUESTS: ADEQUATE

## 2016-12-17 LAB — BASIC METABOLIC PANEL
Anion gap: 10 (ref 5–15)
BUN: 21 mg/dL — ABNORMAL HIGH (ref 6–20)
CALCIUM: 8.6 mg/dL — AB (ref 8.9–10.3)
CHLORIDE: 97 mmol/L — AB (ref 101–111)
CO2: 28 mmol/L (ref 22–32)
CREATININE: 1.46 mg/dL — AB (ref 0.61–1.24)
GFR calc Af Amer: 51 mL/min — ABNORMAL LOW (ref >60)
GFR calc non Af Amer: 44 mL/min — ABNORMAL LOW (ref >60)
GLUCOSE: 138 mg/dL — AB (ref 65–99)
Potassium: 3.7 mmol/L (ref 3.5–5.1)
Sodium: 135 mmol/L (ref 135–145)

## 2016-12-17 MED ORDER — BISACODYL 5 MG PO TBEC
5.0000 mg | DELAYED_RELEASE_TABLET | Freq: Every day | ORAL | Status: DC | PRN
Start: 2016-12-17 — End: 2016-12-18
  Administered 2016-12-17: 5 mg via ORAL
  Filled 2016-12-17: qty 1

## 2016-12-17 MED ORDER — POLYETHYLENE GLYCOL 3350 17 G PO PACK
17.0000 g | PACK | Freq: Every day | ORAL | Status: DC
  Administered 2016-12-17 – 2016-12-18 (×2): 17 g via ORAL
  Filled 2016-12-17 (×2): qty 1

## 2016-12-17 MED ORDER — SODIUM CHLORIDE 0.9 % IV SOLN: INTRAVENOUS | Status: DC

## 2016-12-17 MED ORDER — SODIUM CHLORIDE 0.9 % IV SOLN
INTRAVENOUS | Status: DC
  Administered 2016-12-17 – 2016-12-18 (×2): via INTRAVENOUS

## 2016-12-17 NOTE — Progress Notes (Signed)
Pt's blood cultures came back positive growing streptococcus pyogenes. Dr. Margaretmary Eddy verbally notified during rounds.

## 2016-12-17 NOTE — Plan of Care (Signed)
Pt alert, no complaints, resting quietly, wife at bedside, pt voices any needs

## 2016-12-17 NOTE — Progress Notes (Addendum)
[place] [facility] [place] NAME: Richard Chapman    MR#:  [id number]  DATE OF BIRTH:  [birth year]  SUBJECTIVE:  CHIEF COMPLAINT:   Chief Complaint  Patient presents with  . Weakness     Came with sepsis symptoms. Had skin biopsy done last week on neck and ear- developed infection around biopsy area Today feeling better, OOB to chair, son at bed side  REVIEW OF SYSTEMS:  CONSTITUTIONAL: No fever,positive for fatigue or weakness.  EYES: No blurred or double vision.  EARS, NOSE, AND THROAT: No tinnitus or ear pain.  RESPIRATORY: No cough, shortness of breath, wheezing or hemoptysis.  CARDIOVASCULAR: No chest pain, orthopnea, edema.  GASTROINTESTINAL: No nausea, vomiting, diarrhea or abdominal pain.  GENITOURINARY: No dysuria, hematuria.  ENDOCRINE: No polyuria, nocturia,  HEMATOLOGY: No anemia, easy bruising or bleeding SKIN: lesion on the lateral aspect of the neck MUSCULOSKELETAL: No joint pain or arthritis.   NEUROLOGIC: No tingling, numbness, weakness.  PSYCHIATRY: No anxiety or depression.   ROS  DRUG ALLERGIES:  No Known Allergies  VITALS:  Blood pressure 130/67, pulse (!) 58, temperature 98.1 F (36.7 C), temperature source Oral, resp. rate 18, height 5\' 9"  (1.753 m), weight 81.8 kg (180 lb 6.4 oz), SpO2 100 %.  PHYSICAL EXAMINATION:  GENERAL:  76 y.o.-year-old patient lying in the bed with no acute distress.  EYES: Pupils equal, round, reactive to light and accommodation. No scleral icterus. Extraocular muscles intact.  HEENT: Head atraumatic, normocephalic. Oropharynx and nasopharynx clear.  NECK:  Supple, no jugular venous distention. No thyroid enlargement, on left side neck, induration and tenderness, status post biopsy of the skin lesion LUNGS: Normal breath sounds bilaterally, no wheezing, rales,rhonchi or crepitation. No use of accessory muscles of respiration.  CARDIOVASCULAR: S1, S2 normal. No murmurs, rubs, or gallops.   ABDOMEN: Soft, nontender, nondistended. Bowel sounds present. No organomegaly or mass.  EXTREMITIES: No pedal edema, cyanosis, or clubbing.  NEUROLOGIC: Cranial nerves II through XII are intact. Muscle strength 4/5 in all extremities. Sensation intact. Gait not checked.  PSYCHIATRIC: The patient is alert and oriented x 3.  SKIN: No obvious rash, lesion, or ulcer.   Physical Exam LABORATORY PANEL:   CBC Recent Labs  Lab 12/16/16 0433  WBC 12.5*  HGB 14.0  HCT 41.2  PLT 121*   ------------------------------------------------------------------------------------------------------------------  Chemistries  Recent Labs  Lab 12/14/16 1844  12/16/16 0433 12/17/16 0454  NA 129*   < > 131* 135  K 3.1*   < > 2.9* 3.7  CL 93*   < > 95* 97*  CO2 26   < > 28 28  GLUCOSE 189*   < > 109* 138*  BUN 14   < > 16 21*  CREATININE 1.08   < > 1.10 1.46*  CALCIUM 8.9   < > 8.5* 8.6*  MG  --   --  1.5*  --   AST 25  --   --   --   ALT 17  --   --   --   ALKPHOS 50  --   --   --   BILITOT 1.1  --   --   --    < > = values in this interval not displayed.   ------------------------------------------------------------------------------------------------------------------  Cardiac Enzymes Recent Labs  Lab 12/15/16 0105 12/15/16 0650  TROPONINI 0.04* 0.07*   ------------------------------------------------------------------------------------------------------------------  RADIOLOGY:  No results found.  ASSESSMENT AND PLAN:   Principal Problem:   Sepsis ([place])  Active Problems:   Benign essential HTN   CAD in native artery   Diabetes mellitus (HCC)   Gastro-esophageal reflux disease without esophagitis   Bacteremia  *  Sepsis ([place]) -Bacteremia and cellulitis on the lateral aspect of the neck   Have group A strep- post skin biopsy on neck last week.   Blood cx has revealed Streptococcus pyogenes, pansensitive Appreciate ID recommendations, agreed on Unasyn for now, and patient  will be discharged with Augmentin on d/c - total 14 days course. Echocardiogram has revealed 65% ejection fraction and mild diastolic dysfunction   .  *Acute kidney injury We will avoid IV fluids avoid nephrotoxins and repeat Chem-8 in a.m.  *Constipation -provide laxatives  *  Benign essential HTN -stable, continue home meds  * CAD in native artery -home medications  *Diabetes mellitus (HCC) -sliding scale insulin with corresponding glucose checks  *Gastro-esophageal reflux disease without esophagitis - PPI  * hypokalemia-and hypomagnesemia repleted and repeat labs in a.m.   . * generalized weakness   PT eval-recommending home health PT, supervision for mobility     All the records are reviewed and case discussed with Care Management/Social Workerr. Management plans discussed with the patient, son at bedside and they are in agreement.  CODE STATUS: Full.  TOTAL TIME TAKING CARE OF THIS PATIENT: 35 minutes.   Spoke to wife in room.  POSSIBLE D/C IN 1 DAYS, DEPENDING ON CLINICAL CONDITION.   Nicholes Mango M.D on 12/17/2016   Between 7am to 6pm - Pager - [phone]  After 6pm go to [email or web address] - password [email or web address] [place] [facility]  Office  [phone]  CC: Primary care physician; Leonel Ramsay, MD  Note: This dictation was prepared with Dragon dictation along with smaller phrase technology. Any transcriptional errors that result from this process are unintentional.

## 2016-12-18 LAB — BASIC METABOLIC PANEL
ANION GAP: 8 (ref 5–15)
BUN: 16 mg/dL (ref 6–20)
CALCIUM: 8.1 mg/dL — AB (ref 8.9–10.3)
CO2: 28 mmol/L (ref 22–32)
CREATININE: 1.16 mg/dL (ref 0.61–1.24)
Chloride: 102 mmol/L (ref 101–111)
GFR calc Af Amer: >60 mL/min (ref >60)
GFR, EST NON AFRICAN AMERICAN: 58 mL/min — AB (ref >60)
GLUCOSE: 132 mg/dL — AB (ref 65–99)
Potassium: 3.3 mmol/L — ABNORMAL LOW (ref 3.5–5.1)
Sodium: 138 mmol/L (ref 135–145)

## 2016-12-18 LAB — CBC
HCT: 40.2 % (ref 40.0–52.0)
Hemoglobin: 13.7 g/dL (ref 13.0–18.0)
MCH: 31.5 pg (ref 26.0–34.0)
MCHC: 34 g/dL (ref 32.0–36.0)
MCV: 92.7 fL (ref 80.0–100.0)
PLATELETS: 135 10*3/uL — AB (ref 150–440)
RBC: 4.33 MIL/uL — ABNORMAL LOW (ref 4.40–5.90)
RDW: 15.8 % — AB (ref 11.5–14.5)
WBC: 7.2 10*3/uL (ref 3.8–10.6)

## 2016-12-18 LAB — GLUCOSE, CAPILLARY
GLUCOSE-CAPILLARY: 136 mg/dL — AB (ref 65–99)
Glucose-Capillary: 384 mg/dL — ABNORMAL HIGH (ref 65–99)

## 2016-12-18 LAB — MAGNESIUM: MAGNESIUM: 1.9 mg/dL (ref 1.7–2.4)

## 2016-12-18 MED ORDER — AMOXICILLIN-POT CLAVULANATE 875-125 MG PO TABS: 1.0000 | ORAL_TABLET | Freq: Two times a day (BID) | ORAL | Status: DC

## 2016-12-18 MED ORDER — AMOXICILLIN-POT CLAVULANATE 875-125 MG PO TABS: 1.0000 | ORAL_TABLET | Freq: Two times a day (BID) | ORAL | 0 refills | Status: DC

## 2016-12-18 MED ORDER — POTASSIUM CHLORIDE CRYS ER 20 MEQ PO TBCR
40.0000 meq | EXTENDED_RELEASE_TABLET | Freq: Once | ORAL | Status: AC
  Administered 2016-12-18: 08:00:00 40 meq via ORAL
  Filled 2016-12-18: qty 2

## 2016-12-18 MED ORDER — ACETAMINOPHEN 325 MG PO TABS: 650.0000 mg | ORAL_TABLET | Freq: Four times a day (QID) | ORAL | Status: AC | PRN

## 2016-12-18 NOTE — Care Management (Signed)
At present, there is no indication for long term IV antibiotics.  Physical therapy has recommended home health.  No agency preference.  Referral to [facility] for [facility]

## 2016-12-18 NOTE — Progress Notes (Signed)
Inpatient Diabetes Program Recommendations  AACE/ADA: New Consensus Statement on Inpatient Glycemic Control (2015)  Target Ranges:  Prepandial:   less than 140 mg/dL      Peak postprandial:   less than 180 mg/dL (1-2 hours)      Critically ill patients:  140 - 180 mg/dL   Results for Richard Chapman, Richard Chapman (MRN [id number]) as of 12/18/2016 14:41  Ref. Range 12/17/2016 07:19 12/17/2016 11:45 12/17/2016 16:41 12/17/2016 21:02  Glucose-Capillary Latest Ref Range: 65 - 99 mg/dL 163 (H) 253 (H) 309 (H) 78   Results for Richard Chapman, Richard Chapman (MRN [id number]) as of 12/18/2016 14:41  Ref. Range 12/18/2016 07:34 12/18/2016 11:37  Glucose-Capillary Latest Ref Range: 65 - 99 mg/dL 136 (H) 384 (H)    Home DM Meds: Metformin 500 mg BID       Tresiba 28 units QHS (on weekdays) or 30 units QHS (on weekends)  Current Insulin Orders: Novolog Sensitive Correction Scale/ SSI (0-9 units) TID AC + HS      MD- Note patient eating 100% of meals.  Having issues with post-prandial CBGs.  Please consider starting Novolog Meal Coverage: Novolog 6 units TID with meals (hold if pt eats <50% of meal)      --Will follow patient during hospitalization--  Wyn Quaker RN, MSN, CDE Diabetes Coordinator Inpatient Glycemic Control Team Team Pager: [phone] (8a-5p)

## 2016-12-18 NOTE — Progress Notes (Signed)
Patient discharged home per MD order. Prescriptions given to patient. All discharge instructions given to patient and wife and all questions answered. 

## 2016-12-18 NOTE — Progress Notes (Signed)
Pharmacy Antibiotic Note  Richard Chapman is a 76 y.o. male admitted on 12/14/2016 with sepsis secondary to streptococcus pyogenes bacteremia.  Pharmacy has been consulted for ampicillin/sulbactam dosing. ID is following.  This is day 4 of antibiotics. Plan for 14 days of therapy per ID notes.  Plan: Continue ampicillin/sulbactam 3g IV every 6 hours   Height: 5\' 9"  (175.3 cm) Weight: 180 lb 6.4 oz (81.8 kg) IBW/kg (Calculated) : 70.7  Temp (24hrs), Avg:97.7 F (36.5 C), Min:97.4 F (36.3 C), Max:98.1 F (36.7 C)  Recent Labs  Lab 12/14/16 1844 12/15/16 0222 12/16/16 0433 12/17/16 0454 12/18/16 0518  WBC 13.5* 15.8* 12.5*  --  7.2  CREATININE 1.08 1.05 1.10 1.46* 1.16  LATICACIDVEN 2.8* 1.2  --   --   --     Estimated Creatinine Clearance: 52.5 mL/min (by C-G formula based on SCr of 1.16 mg/dL).    No Known Allergies  Antimicrobials this admission: 11/1 Vanc/Cefepime x 1 dose  11/2 ampicillin/sulbactam >>  Dose adjustments this admission:  Microbiology results: 11/1 BCID: Streptococcus pyogenes, pan-sensitive  Thank you for allowing pharmacy to be a part of this patient's care.  Lenis Noon, PharmD, BCPS Clinical Pharmacist 12/18/2016 8:49 AM

## 2016-12-18 NOTE — Progress Notes (Signed)
[place] INFECTIOUS DISEASE PROGRESS NOTE Date of Admission:  12/14/2016     ID: Roxy Horseman Laforge is a 76 y.o. male with bacteremia, cellulitis Principal Problem:   Sepsis ([place]) Active Problems:   Benign essential HTN   CAD in native artery   Diabetes mellitus ([place])   Gastro-esophageal reflux disease without esophagitis   Bacteremia   Subjective: No fevers, feels better, oob to chair  ROS  Eleven systems are reviewed and negative except per hpi  Medications:  Antibiotics Given (last 72 hours)    Date/Time Action Medication Dose Rate   12/15/16 1859 New Bag/Given   Ampicillin-Sulbactam (UNASYN) 3 g in sodium chloride 0.9 % 100 mL IVPB 3 g 200 mL/hr   12/16/16 0006 New Bag/Given   Ampicillin-Sulbactam (UNASYN) 3 g in sodium chloride 0.9 % 100 mL IVPB 3 g 200 mL/hr   12/16/16 9211 New Bag/Given   Ampicillin-Sulbactam (UNASYN) 3 g in sodium chloride 0.9 % 100 mL IVPB 3 g 200 mL/hr   12/16/16 1254 New Bag/Given   Ampicillin-Sulbactam (UNASYN) 3 g in sodium chloride 0.9 % 100 mL IVPB 3 g 200 mL/hr   12/16/16 1846 New Bag/Given   Ampicillin-Sulbactam (UNASYN) 3 g in sodium chloride 0.9 % 100 mL IVPB 3 g 200 mL/hr   12/16/16 2313 New Bag/Given   Ampicillin-Sulbactam (UNASYN) 3 g in sodium chloride 0.9 % 100 mL IVPB 3 g 200 mL/hr   12/17/16 0444 New Bag/Given   Ampicillin-Sulbactam (UNASYN) 3 g in sodium chloride 0.9 % 100 mL IVPB 3 g 200 mL/hr   12/17/16 1220 New Bag/Given   Ampicillin-Sulbactam (UNASYN) 3 g in sodium chloride 0.9 % 100 mL IVPB 3 g 200 mL/hr   12/17/16 1702 New Bag/Given   Ampicillin-Sulbactam (UNASYN) 3 g in sodium chloride 0.9 % 100 mL IVPB 3 g 200 mL/hr   12/17/16 2317 New Bag/Given   Ampicillin-Sulbactam (UNASYN) 3 g in sodium chloride 0.9 % 100 mL IVPB 3 g 200 mL/hr   12/18/16 0452 New Bag/Given   Ampicillin-Sulbactam (UNASYN) 3 g in sodium chloride 0.9 % 100 mL IVPB 3 g 200 mL/hr   12/18/16 1216 New Bag/Given   Ampicillin-Sulbactam (UNASYN) 3 g in  sodium chloride 0.9 % 100 mL IVPB 3 g 200 mL/hr     . amLODipine  10 mg Oral Daily  . atorvastatin  5 mg Oral Daily  . clopidogrel  75 mg Oral Daily  . enoxaparin (LOVENOX) injection  40 mg Subcutaneous Q24H  . escitalopram  20 mg Oral Daily  . furosemide  40 mg Oral Daily  . gabapentin  300 mg Oral QHS  . insulin aspart  0-5 Units Subcutaneous QHS  . insulin aspart  0-9 Units Subcutaneous TID WC  . isosorbide mononitrate  60 mg Oral BID  . losartan  100 mg Oral Daily  . pantoprazole  40 mg Oral Daily  . polyethylene glycol  17 g Oral Daily  . primidone  50 mg Oral BID  . propranolol  20 mg Oral BID  . senna-docusate  1 tablet Oral QHS  . tamsulosin  0.4 mg Oral Daily    Objective: Vital signs in last 24 hours: Temp:  [97.4 F (36.3 C)-98.1 F (36.7 C)] 97.8 F (36.6 C) (11/05 1400) Pulse Rate:  [53-61] 61 (11/05 1400) Resp:  [18] 18 (11/05 0520) BP: (130-185)/(54-77) 151/67 (11/05 1400) SpO2:  [99 %-100 %] 100 % (11/05 0520) Constitutional: He is oriented to person, place, and time. He appears well-developed and well-nourished.  Frail  HENT: anicteric Mouth/Throat: Oropharynx is clear and moist. No oropharyngeal exudate.  L neck with swelling and induration, no open wounds Cardiovascular: Normal rate, regular rhythm and normal heart sounds. Pulmonary/Chest: Effort normal and breath sounds normal. No respiratory distress. He has no wheezes.  Abdominal: Soft. Bowel sounds are normal. He exhibits no distension. There is no tenderness.  Lymphadenopathy: He has no cervical adenopathy.  Neurological: He is alert and oriented to person, place, and time.  Skin: Skin is warm and dry. No rash noted. No erythema.  Psychiatric: He has a normal mood and affect. His behavior is normal.   Lab Results Recent Labs    12/16/16 0433 12/17/16 0454 12/18/16 0518  WBC 12.5*  --  7.2  HGB 14.0  --  13.7  HCT 41.2  --  40.2  NA 131* 135 138  K 2.9* 3.7 3.3*  CL 95* 97* 102  CO2 28 28 28    BUN 16 21* 16  CREATININE 1.10 1.46* 1.16    Microbiology: Results for orders placed or performed during the hospital encounter of 12/14/16  Blood Culture (routine x 2)     Status: Abnormal   Collection Time: 12/14/16  6:44 PM  Result Value Ref Range Status   Specimen Description BLOOD BLOOD RIGHT HAND  Final   Special Requests   Final    BOTTLES DRAWN AEROBIC AND ANAEROBIC Blood Culture adequate volume   Culture  Setup Time   Final    GRAM POSITIVE COCCI IN BOTH AEROBIC AND ANAEROBIC BOTTLES CRITICAL RESULT CALLED TO, READ BACK BY AND VERIFIED WITH: HANK ZOMPA ON 12/15/16 AT 1054 QSD    Culture STREPTOCOCCUS PYOGENES (A)  Final   Report Status 12/17/2016 FINAL  Final   Organism ID, Bacteria STREPTOCOCCUS PYOGENES  Final      Susceptibility   Streptococcus pyogenes - MIC*    PENICILLIN <=0.06 SENSITIVE Sensitive     CEFTRIAXONE <=0.12 SENSITIVE Sensitive     ERYTHROMYCIN <=0.12 SENSITIVE Sensitive     LEVOFLOXACIN 0.5 SENSITIVE Sensitive     VANCOMYCIN 0.25 SENSITIVE Sensitive     * STREPTOCOCCUS PYOGENES  Blood Culture (routine x 2)     Status: Abnormal   Collection Time: 12/14/16  6:44 PM  Result Value Ref Range Status   Specimen Description BLOOD BLOOD LEFT FOREARM  Final   Special Requests   Final    BOTTLES DRAWN AEROBIC AND ANAEROBIC Blood Culture results may not be optimal due to an excessive volume of blood received in culture bottles   Culture  Setup Time   Final    GRAM POSITIVE COCCI IN BOTH AEROBIC AND ANAEROBIC BOTTLES CRITICAL VALUE NOTED.  VALUE IS CONSISTENT WITH PREVIOUSLY REPORTED AND CALLED VALUE.    Culture (A)  Final    STREPTOCOCCUS PYOGENES SUSCEPTIBILITIES PERFORMED ON PREVIOUS CULTURE WITHIN THE LAST 5 DAYS. Performed at [facility], [place] [address], [place], [place] [address]    Report Status 12/17/2016 FINAL  Final  Blood Culture ID Panel (Reflexed)     Status: Abnormal   Collection Time: 12/14/16  6:44 PM  Result Value Ref Range Status    Enterococcus species NOT DETECTED NOT DETECTED Final   Listeria monocytogenes NOT DETECTED NOT DETECTED Final   Staphylococcus species NOT DETECTED NOT DETECTED Final   Staphylococcus aureus NOT DETECTED NOT DETECTED Final   Streptococcus species DETECTED (A) NOT DETECTED Final    Comment: CRITICAL RESULT CALLED TO, READ BACK BY AND VERIFIED WITH: HANK ZOMPA  ON 12/15/16 AT 1054 QSD    Streptococcus agalactiae NOT DETECTED NOT DETECTED Final   Streptococcus pneumoniae NOT DETECTED NOT DETECTED Final   Streptococcus pyogenes DETECTED (A) NOT DETECTED Final    Comment: CRITICAL RESULT CALLED TO, READ BACK BY AND VERIFIED WITH: HANK ZOMPA ON 12/15/16 AT 1054 QSD    Acinetobacter baumannii NOT DETECTED NOT DETECTED Final   Enterobacteriaceae species NOT DETECTED NOT DETECTED Final   Enterobacter cloacae complex NOT DETECTED NOT DETECTED Final   Escherichia coli NOT DETECTED NOT DETECTED Final   Klebsiella oxytoca NOT DETECTED NOT DETECTED Final   Klebsiella pneumoniae NOT DETECTED NOT DETECTED Final   Proteus species NOT DETECTED NOT DETECTED Final   Serratia marcescens NOT DETECTED NOT DETECTED Final   Haemophilus influenzae NOT DETECTED NOT DETECTED Final   Neisseria meningitidis NOT DETECTED NOT DETECTED Final   Pseudomonas aeruginosa NOT DETECTED NOT DETECTED Final   Candida albicans NOT DETECTED NOT DETECTED Final   Candida glabrata NOT DETECTED NOT DETECTED Final   Candida krusei NOT DETECTED NOT DETECTED Final   Candida parapsilosis NOT DETECTED NOT DETECTED Final   Candida tropicalis NOT DETECTED NOT DETECTED Final  Respiratory Panel by PCR     Status: None   Collection Time: 12/14/16 10:50 PM  Result Value Ref Range Status   Adenovirus NOT DETECTED NOT DETECTED Final   Coronavirus 229E NOT DETECTED NOT DETECTED Final   Coronavirus HKU1 NOT DETECTED NOT DETECTED Final   Coronavirus NL63 NOT DETECTED NOT DETECTED Final   Coronavirus OC43 NOT DETECTED NOT DETECTED Final    Metapneumovirus NOT DETECTED NOT DETECTED Final   Rhinovirus / Enterovirus NOT DETECTED NOT DETECTED Final   Influenza A NOT DETECTED NOT DETECTED Final   Influenza B NOT DETECTED NOT DETECTED Final   Parainfluenza Virus 1 NOT DETECTED NOT DETECTED Final   Parainfluenza Virus 2 NOT DETECTED NOT DETECTED Final   Parainfluenza Virus 3 NOT DETECTED NOT DETECTED Final   Parainfluenza Virus 4 NOT DETECTED NOT DETECTED Final   Respiratory Syncytial Virus NOT DETECTED NOT DETECTED Final   Bordetella pertussis NOT DETECTED NOT DETECTED Final   Chlamydophila pneumoniae NOT DETECTED NOT DETECTED Final   Mycoplasma pneumoniae NOT DETECTED NOT DETECTED Final    Comment: Performed at [facility], [address], [place], [place] [address]    Studies/Results: No results found.  Assessment/Plan: [place] Espina is a 76 y.o. male admitted with fevers, weakness and found to have L neck cellulitis related to prior skin bxp site. BCX + Grp A strep. Clinically improving. CT neg for abscess.  I would suggest not narrowing just to cover the Grp A strep as the infection may be polymicrobial in his neck. Doing better, no fevers. WBC down to 7.  Recommendations Cont unasyn while inpatient Can change to oral augmentin orally for a total 14 day course. I can see within 2 weeks of DC.  Thank you very much for the consult. Will follow with you.  Leonel Ramsay   12/18/2016, 2:06 PM

## 2016-12-18 NOTE — Discharge Summary (Signed)
[facility] [place] NAME: Richard Chapman    MR#:  [id number]  DATE OF BIRTH:  1940/09/01  DATE OF ADMISSION:  12/14/2016 ADMITTING PHYSICIAN: Lance Coon, MD  DATE OF DISCHARGE:  12/18/16  PRIMARY CARE PHYSICIAN: Leonel Ramsay, MD    ADMISSION DIAGNOSIS:  Dehydration [E86.0] Hyponatremia [E87.1] Generalized weakness [R53.1] Febrile illness [R50.9]  DISCHARGE DIAGNOSIS:  Principal Problem:   Sepsis ([facility] [place] [facility]) Active Problems:   Benign essential HTN   CAD in native artery   Diabetes mellitus ([place])   Gastro-esophageal reflux disease without esophagitis   Bacteremia   SECONDARY DIAGNOSIS:   Past Medical History:  Diagnosis Date  . Anxiety and depression   . Depression   . Diabetes ([place])   . Elevated PSA   . GERD (gastroesophageal reflux disease)   . History of nephrolithiasis   . Hypertension   . Skin cancer   . Sleep apnea     HOSPITAL COURSE:   hpi  Richard Chapman  is a 76 y.o. male who presents with weakness, myalgias.  Patient states that this started earlier today, and that he has been aching all over.  He has had a little bit of a cough.  He also had some sweats earlier today.  Initial workup here in the ED shows patient meets sepsis criteria, but does not elicit a clear source of infection.  Chest x-ray did not show pneumonia, UA was not indicative of UTI.  Cultures have been sent and are pending.  Hospitalist were called for admission    *Sepsis ([place]) -Bacteremia and cellulitis on the lateral aspect of the neck   Have group A strep- post skin biopsy on neck last week.   Blood cx has revealed Streptococcus pyogenes, pansensitive Appreciate ID recommendations, agreed on Unasyn for now, and patient will be discharged with Augmentin on d/c - total 14 days course. Echocardiogram has revealed 65% ejection fraction and mild diastolic dysfunction   .  *Acute kidney injury Improved with IV fluids avoid nephrotoxins  and repeat Chem-8 creatinine improved from 1.46-1.16  *Constipation -provide laxatives  *Benign essential HTN -stable, continue home meds  *CAD in native artery -home medications  *Diabetes mellitus ([facility]) -sliding scale insulin with corresponding glucose checks  *Gastro-esophageal reflux disease without esophagitis - PPI  * hypokalemia-and hypomagnesemia repleted and  magnesium at 1.9 today potassium 3.3 repleted   . * generalized weakness   PT eval-recommending home health PT, supervision for mobility    DISCHARGE CONDITIONS:   fair  CONSULTS OBTAINED:  Treatment Team:  Leonel Ramsay, MD   PROCEDURES  None   DRUG ALLERGIES:  No Known Allergies  DISCHARGE MEDICATIONS:   Current Discharge Medication List    START taking these medications   Details  acetaminophen (TYLENOL) 325 MG tablet Take 2 tablets (650 mg total) every 6 (six) hours as needed by mouth for mild pain (or Fever >/= 101).    amoxicillin-clavulanate (AUGMENTIN) 875-125 MG tablet Take 1 tablet every 12 (twelve) hours by mouth. Qty: 20 tablet, Refills: 0      CONTINUE these medications which have NOT CHANGED   Details  amLODipine (NORVASC) 10 MG tablet Take 1 tablet (10 mg total) by mouth daily. Qty: 30 tablet, Refills: 2    atorvastatin (LIPITOR) 10 MG tablet Take 5 mg by mouth daily.    clopidogrel (PLAVIX) 75 MG tablet Take 1 tablet (75 mg total) by mouth daily. Qty: 60 tablet, Refills: 1  docusate sodium (COLACE) 100 MG capsule Take 1 capsule (100 mg total) by mouth 2 (two) times daily as needed for mild constipation. Qty: 10 capsule, Refills: 0    escitalopram (LEXAPRO) 20 MG tablet Take 1 tablet (20 mg total) by mouth daily. Qty: 30 tablet, Refills: 0    esomeprazole (NEXIUM) 20 MG capsule Take 1 capsule (20 mg total) by mouth AC breakfast. Qty: 60 capsule, Refills: 1    furosemide (LASIX) 40 MG tablet Take 1 tablet (40 mg total) by mouth daily. Qty: 30 tablet,  Refills: 0    gabapentin (NEURONTIN) 300 MG capsule Take 300 mg by mouth at bedtime.     hyoscyamine (LEVBID) 0.375 MG 12 hr tablet Take 0.375 mg by mouth every 12 (twelve) hours.    insulin degludec (TRESIBA FLEXTOUCH) 100 UNIT/ML SOPN FlexTouch Pen Inject 28-30 Units into the skin daily at 10 pm. 28 units on the weekdays and 30 units on the weekends    isosorbide mononitrate (IMDUR) 60 MG 24 hr tablet Take 1 tablet (60 mg total) by mouth 2 (two) times daily. Qty: 60 tablet, Refills: 0    losartan (COZAAR) 100 MG tablet Take 100 mg by mouth daily.     magnesium oxide (MAG-OX) 400 MG tablet Take 400 mg by mouth daily.     metFORMIN (GLUCOPHAGE) 1000 MG tablet Take 1 tablet (1,000 mg total) by mouth daily with breakfast. Qty: 30 tablet, Refills: 0    nitroGLYCERIN (NITROSTAT) 0.4 MG SL tablet Place 0.4 mg under the tongue every 5 (five) minutes as needed for chest pain.     primidone (MYSOLINE) 50 MG tablet Take 1 tablet by mouth 2 (two) times daily.    propranolol (INDERAL) 20 MG tablet Take 20 mg by mouth 2 (two) times daily.     tamsulosin (FLOMAX) 0.4 MG CAPS capsule Take 0.4 mg by mouth daily.     vitamin B-12 (CYANOCOBALAMIN) 1000 MCG tablet Take 1,000 mcg by mouth daily.          DISCHARGE INSTRUCTIONS:  Follow-up with your primary care physician in 1 week Follow-up with dermatology after seeing primary care physician may be in 2 weeks Home health PT    DIET:  Cardiac diet and Diabetic diet  DISCHARGE CONDITION:  Stable  ACTIVITY:  Activity as tolerated  OXYGEN:  Home Oxygen: No.   Oxygen Delivery: room air  DISCHARGE LOCATION:  home   If you experience worsening of your admission symptoms, develop shortness of breath, life threatening emergency, suicidal or homicidal thoughts you must seek medical attention immediately by calling 911 or calling your MD immediately  if symptoms less severe.  You Must read complete instructions/literature along with all  the possible adverse reactions/side effects for all the Medicines you take and that have been prescribed to you. Take any new Medicines after you have completely understood and accpet all the possible adverse reactions/side effects.   Please note  You were cared for by a hospitalist during your hospital stay. If you have any questions about your discharge medications or the care you received while you were in the hospital after you are discharged, you can call the unit and asked to speak with the hospitalist on call if the hospitalist that took care of you is not available. Once you are discharged, your primary care physician will handle any further medical issues. Please note that NO REFILLS for any discharge medications will be authorized once you are discharged, as it is imperative that you return  to your primary care physician (or establish a relationship with a primary care physician if you do not have one) for your aftercare needs so that they can reassess your need for medications and monitor your lab values.     Today  Chief Complaint  Patient presents with  . Weakness   Patient is feeling fine.  No complaints.  Wants to go home.  ROS:  CONSTITUTIONAL: Denies fevers, chills. Denies any fatigue, weakness.  EYES: Denies blurry vision, double vision, eye pain. EARS, NOSE, THROAT: Denies tinnitus, ear pain, hearing loss. RESPIRATORY: Denies cough, wheeze, shortness of breath.  CARDIOVASCULAR: Denies chest pain, palpitations, edema.  GASTROINTESTINAL: Denies nausea, vomiting, diarrhea, abdominal pain. Denies bright red blood per rectum. GENITOURINARY: Denies dysuria, hematuria. ENDOCRINE: Denies nocturia or thyroid problems. HEMATOLOGIC AND LYMPHATIC: Denies easy bruising or bleeding. SKIN: Lesion on the lateral aspect of the neck and earlobe MUSCULOSKELETAL: Denies pain in neck, back, shoulder, knees, hips or arthritic symptoms.  NEUROLOGIC: Denies paralysis, paresthesias.   PSYCHIATRIC: Denies anxiety or depressive symptoms.   VITAL SIGNS:  Blood pressure (!) 151/67, pulse 61, temperature 97.8 F (36.6 C), temperature source Oral, resp. rate 18, height 5\' 9"  (1.753 m), weight 81.8 kg (180 lb 6.4 oz), SpO2 100 %.  I/O:    Intake/Output Summary (Last 24 hours) at 12/18/2016 1555 Last data filed at 12/18/2016 1408 Gross per 24 hour  Intake 1567.5 ml  Output 1325 ml  Net 242.5 ml    PHYSICAL EXAMINATION:  GENERAL:  76 y.o.-year-old patient lying in the bed with no acute distress.  EYES: Pupils equal, round, reactive to light and accommodation. No scleral icterus. Extraocular muscles intact.  HEENT: Head atraumatic, normocephalic. Oropharynx and nasopharynx clear.  NECK:  Supple, no jugular venous distention. No thyroid enlargement, no tenderness.  LUNGS: Normal breath sounds bilaterally, no wheezing, rales,rhonchi or crepitation. No use of accessory muscles of respiration.  CARDIOVASCULAR: S1, S2 normal. No murmurs, rubs, or gallops.  ABDOMEN: Soft, non-tender, non-distended. Bowel sounds present. No organomegaly or mass.  EXTREMITIES: No pedal edema, cyanosis, or clubbing.  NEUROLOGIC: Cranial nerves II through XII are intact. Muscle strength 5/5 in all extremities. Sensation intact. Gait not checked.  PSYCHIATRIC: The patient is alert and oriented x 3.  SKIN: Lesion on the lateral aspect of the neck and earlobe on the left side.  No erythema no discharge  DATA REVIEW:   CBC Recent Labs  Lab 12/18/16 0518  WBC 7.2  HGB 13.7  HCT 40.2  PLT 135*    Chemistries  Recent Labs  Lab 12/14/16 1844  12/18/16 0518  NA 129*   < > 138  K 3.1*   < > 3.3*  CL 93*   < > 102  CO2 26   < > 28  GLUCOSE 189*   < > 132*  BUN 14   < > 16  CREATININE 1.08   < > 1.16  CALCIUM 8.9   < > 8.1*  MG  --    < > 1.9  AST 25  --   --   ALT 17  --   --   ALKPHOS 50  --   --   BILITOT 1.1  --   --    < > = values in this interval not displayed.    Cardiac  Enzymes Recent Labs  Lab 12/15/16 0650  TROPONINI 0.07*    Microbiology Results  Results for orders placed or performed during the hospital encounter of 12/14/16  Blood  Culture (routine x 2)     Status: Abnormal   Collection Time: 12/14/16  6:44 PM  Result Value Ref Range Status   Specimen Description BLOOD BLOOD RIGHT HAND  Final   Special Requests   Final    BOTTLES DRAWN AEROBIC AND ANAEROBIC Blood Culture adequate volume   Culture  Setup Time   Final    GRAM POSITIVE COCCI IN BOTH AEROBIC AND ANAEROBIC BOTTLES CRITICAL RESULT CALLED TO, READ BACK BY AND VERIFIED WITH: HANK ZOMPA ON 12/15/16 AT 1054 QSD    Culture STREPTOCOCCUS PYOGENES (A)  Final   Report Status 12/17/2016 FINAL  Final   Organism ID, Bacteria STREPTOCOCCUS PYOGENES  Final      Susceptibility   Streptococcus pyogenes - MIC*    PENICILLIN <=0.06 SENSITIVE Sensitive     CEFTRIAXONE <=0.12 SENSITIVE Sensitive     ERYTHROMYCIN <=0.12 SENSITIVE Sensitive     LEVOFLOXACIN 0.5 SENSITIVE Sensitive     VANCOMYCIN 0.25 SENSITIVE Sensitive     * STREPTOCOCCUS PYOGENES  Blood Culture (routine x 2)     Status: Abnormal   Collection Time: 12/14/16  6:44 PM  Result Value Ref Range Status   Specimen Description BLOOD BLOOD LEFT FOREARM  Final   Special Requests   Final    BOTTLES DRAWN AEROBIC AND ANAEROBIC Blood Culture results may not be optimal due to an excessive volume of blood received in culture bottles   Culture  Setup Time   Final    GRAM POSITIVE COCCI IN BOTH AEROBIC AND ANAEROBIC BOTTLES CRITICAL VALUE NOTED.  VALUE IS CONSISTENT WITH PREVIOUSLY REPORTED AND CALLED VALUE.    Culture (A)  Final    STREPTOCOCCUS PYOGENES SUSCEPTIBILITIES PERFORMED ON PREVIOUS CULTURE WITHIN THE LAST 5 DAYS. Performed at [facility], [place] [address], [place], [place] [address]    Report Status 12/17/2016 FINAL  Final  Blood Culture ID Panel (Reflexed)     Status: Abnormal   Collection Time: 12/14/16  6:44 PM  Result  Value Ref Range Status   Enterococcus species NOT DETECTED NOT DETECTED Final   Listeria monocytogenes NOT DETECTED NOT DETECTED Final   Staphylococcus species NOT DETECTED NOT DETECTED Final   Staphylococcus aureus NOT DETECTED NOT DETECTED Final   Streptococcus species DETECTED (A) NOT DETECTED Final    Comment: CRITICAL RESULT CALLED TO, READ BACK BY AND VERIFIED WITH: HANK ZOMPA ON 12/15/16 AT 1054 QSD    Streptococcus agalactiae NOT DETECTED NOT DETECTED Final   Streptococcus pneumoniae NOT DETECTED NOT DETECTED Final   Streptococcus pyogenes DETECTED (A) NOT DETECTED Final    Comment: CRITICAL RESULT CALLED TO, READ BACK BY AND VERIFIED WITH: HANK ZOMPA ON 12/15/16 AT 1054 QSD    Acinetobacter baumannii NOT DETECTED NOT DETECTED Final   Enterobacteriaceae species NOT DETECTED NOT DETECTED Final   Enterobacter cloacae complex NOT DETECTED NOT DETECTED Final   Escherichia coli NOT DETECTED NOT DETECTED Final   Klebsiella oxytoca NOT DETECTED NOT DETECTED Final   Klebsiella pneumoniae NOT DETECTED NOT DETECTED Final   Proteus species NOT DETECTED NOT DETECTED Final   Serratia marcescens NOT DETECTED NOT DETECTED Final   Haemophilus influenzae NOT DETECTED NOT DETECTED Final   Neisseria meningitidis NOT DETECTED NOT DETECTED Final   Pseudomonas aeruginosa NOT DETECTED NOT DETECTED Final   Candida albicans NOT DETECTED NOT DETECTED Final   Candida glabrata NOT DETECTED NOT DETECTED Final   Candida krusei NOT DETECTED NOT DETECTED Final   Candida parapsilosis NOT DETECTED NOT DETECTED  Final   Candida tropicalis NOT DETECTED NOT DETECTED Final  Respiratory Panel by PCR     Status: None   Collection Time: 12/14/16 10:50 PM  Result Value Ref Range Status   Adenovirus NOT DETECTED NOT DETECTED Final   Coronavirus 229E NOT DETECTED NOT DETECTED Final   Coronavirus HKU1 NOT DETECTED NOT DETECTED Final   Coronavirus NL63 NOT DETECTED NOT DETECTED Final   Coronavirus OC43 NOT DETECTED NOT  DETECTED Final   Metapneumovirus NOT DETECTED NOT DETECTED Final   Rhinovirus / Enterovirus NOT DETECTED NOT DETECTED Final   Influenza A NOT DETECTED NOT DETECTED Final   Influenza B NOT DETECTED NOT DETECTED Final   Parainfluenza Virus 1 NOT DETECTED NOT DETECTED Final   Parainfluenza Virus 2 NOT DETECTED NOT DETECTED Final   Parainfluenza Virus 3 NOT DETECTED NOT DETECTED Final   Parainfluenza Virus 4 NOT DETECTED NOT DETECTED Final   Respiratory Syncytial Virus NOT DETECTED NOT DETECTED Final   Bordetella pertussis NOT DETECTED NOT DETECTED Final   Chlamydophila pneumoniae NOT DETECTED NOT DETECTED Final   Mycoplasma pneumoniae NOT DETECTED NOT DETECTED Final    Comment: Performed at [facility], [place] [address] [place], [place] [address]    RADIOLOGY:  Ct Soft Tissue Neck W Contrast  Result Date: 12/15/2016 CLINICAL DATA:  Neck hematoma, traumatic. Patient has left-sided neck biopsy month ago and is experiencing pain and tenderness. EXAM: CT NECK WITH CONTRAST TECHNIQUE: Multidetector CT imaging of the neck was performed using the standard protocol following the bolus administration of intravenous contrast. CONTRAST:  44mL ISOVUE-300 IOPAMIDOL (ISOVUE-300) INJECTION 61% COMPARISON:  CTA neck 04/05/2015. FINDINGS: Pharynx and larynx: No evidence of inflammation or mass. Salivary glands: Stranding around the tail of the left parotid that does not appear primarily inflamed. There is a lobulated high-density nodular focus in the left parotid tail which is likely enlarged lymph nodes, the larger measuring up to 13 mm long axis. Smaller nodes were same in the in the same location on comparison CTA. Thyroid: 3.3 cm right thyroid nodule.  This was biopsied in 2017. Lymph nodes: Probable reactive nodes in the left parotid tail, as above. Otherwise negative. Vascular: Atherosclerotic calcification at the left common carotid bifurcation. No acute finding. Limited intracranial: Negative Visualized  orbits: Minimally covered and negative Mastoids and visualized paranasal sinuses: Under aerated mastoid on the left. Air cells are currently patent. Skeleton: Biparietal bur holes on scout images. C3-4 discectomy with cage. C5-6 and C6-7 ACDF with ventral plate. Bulky thoracic spondylosis. Upper chest: No acute finding when allowing for motion. Other: There is skin and subcutaneous fat reticulation along the left supraclavicular fossa and left neck. No abscess, soft tissue gas, opaque foreign body. Edema tracks along the superficial left sternocleidomastoid. IMPRESSION: 1. Cellulitic changes in the left neck and supraclavicular fossa. No abscess or soft tissue gas. 2. Clustered nodular densities in the left parotid tail are likely reactive lymph nodes when compared to 2017 CTA. Suggest clinical surveillance of this area after convalescence. Electronically Signed   By: Monte Fantasia M.D.   On: 12/15/2016 13:06   Dg Chest Port 1 View  Result Date: 12/14/2016 CLINICAL DATA:  Generalized weakness, body aches and fever today. EXAM: PORTABLE CHEST 1 VIEW COMPARISON:  09/23/2015 and prior radiograph FINDINGS: The cardiomediastinal silhouette is unremarkable. There is no evidence of focal airspace disease, pulmonary edema, suspicious pulmonary nodule/mass, pleural effusion, or pneumothorax. No acute bony abnormalities are identified. Cervical fusion changes again noted. IMPRESSION: No active disease.  Electronically Signed   By: Margarette Canada M.D.   On: 12/14/2016 19:10    EKG:   Orders placed or performed during the hospital encounter of 12/14/16  . ED EKG  . ED EKG      Management plans discussed with the patient, family and they are in agreement.  CODE STATUS:     Code Status Orders  (From admission, onward)        Start     Ordered   12/15/16 0003  Full code  Continuous     12/15/16 0002    Code Status History    Date Active Date Inactive Code Status Order ID Comments User Context    09/24/2015 05:40 09/24/2015 16:48 Full Code [id number]  Harrie Foreman, MD Inpatient   04/05/2015 10:36 04/07/2015 16:54 Full Code [id number]  Fritzi Mandes, MD Inpatient   11/25/2014 07:23 11/26/2014 21:01 Full Code [id number]  Hillary Bow, MD ED    Advance Directive Documentation     Most Recent Value  Type of Advance Directive  Living will  Pre-existing out of facility DNR order (yellow form or pink MOST form)  No data  "MOST" Form in Place?  No data      TOTAL TIME TAKING CARE OF THIS PATIENT: 43 minutes.   Note: This dictation was prepared with Dragon dictation along with smaller phrase technology. Any transcriptional errors that result from this process are unintentional.   @[facility] @  on 12/18/2016 at 3:55 PM  Between 7am to 6pm - Pager - [phone]  After 6pm go to [email or web address] - password [email or web address] [facility]  [facility]  Office  [phone]  CC: Primary care physician; Leonel Ramsay, MD

## 2016-12-18 NOTE — Discharge Instructions (Signed)
Follow-up with your primary care physician in 1 week Follow-up with dermatology after seeing primary care physician may be in 2 weeks Home health PT

## 2016-12-18 NOTE — Progress Notes (Signed)
Physical Therapy Treatment Patient Details Name: Richard Chapman MRN: [id number] DOB: 19-Aug-1940 Today's Date: 12/18/2016    History of Present Illness Richard Chapman is a 76yo white male who comes to [facility] on 11/[facility] with weakness and myalgias, admitted with sepsis, found to have Left Neck cellulitis. PMH: OSA, HTN, DM, GERD, orthopedic surgeries to neck, back, knee, wrist, and shoulder. At baseline, pt has difficulty with household distances with [facility], on occasion will AMB [employer] with a buggy for support. Wife Marcie Bal helps with bathing and dressing.     PT Comments    Pt demonstrates excellent progress with therapy on this date. He is able to complete a full lap around RN station with therapy. Pt utilizes a rolling walker for ambulation. He reports some mild DOE but VSS throughout ambulation distance. No overt LOB with use of rolling walker. Fatigue continuously monitored througout ambulation distance. He is able to complete seated exercises as instructed. Pt will continue to benefit from PT services to address deficits in strength, balance, and mobility in order to return to full function at home.    Follow Up Recommendations  Home health PT;Supervision for mobility/OOB     Equipment Recommendations  None recommended by PT    Recommendations for Other Services       Precautions / Restrictions Precautions Precautions: Fall Restrictions Weight Bearing Restrictions: No    Mobility  Bed Mobility               General bed mobility comments: Received and left upright in recliner  Transfers Overall transfer level: Needs assistance Equipment used: Rolling walker (2 wheeled) Transfers: Sit to/from Stand Sit to Stand: Min guard         General transfer comment: Pt unable to perform trnasfer without UE support on chair. Mildly unsteady in standing without UE support. Increased time to come to standing  Ambulation/Gait Ambulation/Gait assistance: Min guard Ambulation Distance  (Feet): 220 Feet Assistive device: Rolling walker (2 wheeled) Gait Pattern/deviations: Decreased step length - left;Decreased stance time - right Gait velocity: Decreased Gait velocity interpretation: <1.8 ft/sec, indicative of risk for recurrent falls General Gait Details: Pt able to complete a full lap around RN station with therapy. He reports some mild DOE but VSS throughout ambulation distance. No overt LOB with use of rolling walker. Fatigue continuously monitored througout ambulation distance   Stairs            Wheelchair Mobility    Modified Rankin (Stroke Patients Only)       Balance Overall balance assessment: Needs assistance Sitting-balance support: No upper extremity supported Sitting balance-Leahy Scale: Good     Standing balance support: No upper extremity supported Standing balance-Leahy Scale: Fair Standing balance comment: Able to maintain feet apart balance without UE support. Requires UE support when placing feet together                            Cognition Arousal/Alertness: Awake/alert Behavior During Therapy: WFL for tasks assessed/performed Overall Cognitive Status: Within Functional Limits for tasks assessed                                        Exercises General Exercises - Lower Extremity Long Arc Quad: Strengthening;Both;15 reps;Seated Heel Slides: Strengthening;Both;15 reps;Seated Hip ABduction/ADduction: Strengthening;Both;15 reps;Seated Hip Flexion/Marching: Strengthening;Both;15 reps;Seated Heel Raises: Strengthening;Both;15 reps;Seated Other Exercises Other Exercises: Standing balance  practice with feet together x 30s, feet apart x 30s, standing marches without UE support x 10 bilateral    General Comments        Pertinent Vitals/Pain Pain Assessment: 0-10 Pain Score: 8  Pain Location: Neck pain, chronic prior to admission related to a "bulging disc." Not related to cellulitis Pain  Intervention(s): Monitored during session    Home Living                      Prior Function            PT Goals (current goals can now be found in the care plan section) Acute Rehab PT Goals Patient Stated Goal: reduce gait instability and knee buckling PT Goal Formulation: With patient Time For Goal Achievement: 12/30/16 Potential to Achieve Goals: Fair Progress towards PT goals: Progressing toward goals    Frequency    Min 2X/week      PT Plan Current plan remains appropriate    Co-evaluation              AM-PAC PT "6 Clicks" Daily Activity  Outcome Measure  Difficulty turning over in bed (including adjusting bedclothes, sheets and blankets)?: A Little Difficulty moving from lying on back to sitting on the side of the bed? : A Little Difficulty sitting down on and standing up from a chair with arms (e.g., wheelchair, bedside commode, etc,.)?: A Little Help needed moving to and from a bed to chair (including a wheelchair)?: A Little Help needed walking in hospital room?: A Little Help needed climbing 3-5 steps with a railing? : A Little 6 Click Score: 18    End of Session Equipment Utilized During Treatment: Gait belt Activity Tolerance: Patient tolerated treatment well;Patient limited by fatigue Patient left: in chair;with call bell/phone within reach;with family/visitor present;with nursing/sitter in room   PT Visit Diagnosis: Unsteadiness on feet (R26.81);Muscle weakness (generalized) (M62.81);Difficulty in walking, not elsewhere classified (R26.2)     Time: 6160-7371 PT Time Calculation (min) (ACUTE ONLY): 23 min  Charges:  $Gait Training: 8-22 mins $Therapeutic Exercise: 8-22 mins                    G Codes:       Richard Chapman PT, DPT     Richard Chapman 12/18/2016, 11:17 AM

## 2016-12-18 NOTE — Care Management Important Message (Signed)
Important Message  Patient Details  Name: Richard Chapman MRN: [id number] Date of Birth: July 20, 1940   Medicare Important Message Given:  Yes Signed IM notice given   Katrina Stack, RN 12/18/2016, 8:41 AM

## 2016-12-26 ENCOUNTER — Telehealth: Payer: Self-pay | Admitting: Urology

## 2016-12-26 NOTE — Telephone Encounter (Signed)
Patient has an app on 01-12-17 and will need orders for PSA to go to [facility] prior    [place]

## 2016-12-29 ENCOUNTER — Ambulatory Visit: Payer: Medicare Other | Admitting: Urology

## 2017-01-09 ENCOUNTER — Other Ambulatory Visit
Admission: RE | Admit: 2017-01-09 | Discharge: 2017-01-09 | Disposition: A | Discharge status: Home / Self Care | Payer: Medicare Other | Source: Ambulatory Visit | Attending: Urology | Admitting: Urology

## 2017-01-09 ENCOUNTER — Other Ambulatory Visit: Payer: Self-pay

## 2017-01-09 DIAGNOSIS — C61 Malignant neoplasm of prostate: Secondary | ICD-10-CM | POA: Insufficient documentation

## 2017-01-09 LAB — PSA: Prostatic Specific Antigen: 11.98 ng/mL — ABNORMAL HIGH (ref 0.00–4.00)

## 2017-01-09 NOTE — Addendum Note (Signed)
Addended by: Elmo Putt on: 01/09/2017 09:16 AM   Modules accepted: Orders

## 2017-01-12 ENCOUNTER — Encounter: Payer: Self-pay | Admitting: Urology

## 2017-01-12 ENCOUNTER — Ambulatory Visit (INDEPENDENT_AMBULATORY_CARE_PROVIDER_SITE_OTHER): Payer: Medicare Other | Admitting: Urology

## 2017-01-12 VITALS — BP 139/65 | HR 79 | Ht 69.0 in | Wt 178.0 lb

## 2017-01-12 DIAGNOSIS — N3941 Urge incontinence: Secondary | ICD-10-CM

## 2017-01-12 DIAGNOSIS — N3281 Overactive bladder: Secondary | ICD-10-CM

## 2017-01-12 DIAGNOSIS — C61 Malignant neoplasm of prostate: Secondary | ICD-10-CM | POA: Diagnosis not present

## 2017-01-12 NOTE — Progress Notes (Signed)
2:18 PM  01/12/17   Richard Chapman 04/09/1940 [id number]  Referring provider: Leonel Ramsay, MD [address] [place], [place] [address]  Chief Complaint  Patient presents with  . Prostate Cancer    55months    HPI: 76 year old male with prostate cancer on active surveillance returns today for follow-up.  He has recently been discharged from the hospital following febrile illness.    Prostate cancer  Elevated PSA to 9.1 dx 09/2014 with Gleason 3+3 in 2 cores involving the right apex, up to 23% of tissue.  Rectal exam was benign. TRUS volume 60 cc.     MRI performed on 02/22/2016 shows no evidence of obvious high-grade lesions.  He does have nodular transition zone.  Prostatic capsule is intact.. He does have some subclinical lymph nodes in the bilateral iliac measuring 6 mm.  Gland size 66 cc.    His PSA today is 11.98 stable from 11.93 6 months ago and down from 13.09 last year.    OAB/ urge incontinence He does have urinary frequency, urgency, and nocturia which is fairly significant. He now wear depends for both urge incontinence as well as fecal incontinence.  He failed Mybetriq 25 mg, Vesicare 5 mg and most recently tried Mybetriq 50 mg daily which did not help enough.    He does admit to continued drinking of coffee, tea, sodas and beer on a regular basis (has not cut back).   This is unchanged again today.    He does report that he has been doing a better job of controlling his diabetes.  ED He also has severe baseline ED.   PSA trend below:  Component     Latest Ref Rng & Units 01/29/2015 06/02/2015 10/15/2015 02/16/2016  PSA     0.00 - 4.00 ng/mL 11.4 (H) 7.9 (H) 10.7 (H) 13.09 (H)   Component     Latest Ref Rng & Units 01/09/2017  Prostatic Specific Antigen     0.00 - 4.00 ng/mL 11.98 (H)    PMH: Past Medical History:  Diagnosis Date  . Anxiety and depression   . Depression   . Diabetes ([place])   . Elevated PSA   . GERD (gastroesophageal  reflux disease)   . History of nephrolithiasis   . Hypertension   . Skin cancer   . Sleep apnea     Surgical History: Past Surgical History:  Procedure Laterality Date  . APPENDECTOMY    . BACK SURGERY    . BURR HOLE FOR SUBDURAL HEMATOMA     2015  . KNEE SURGERY Bilateral   . NECK SURGERY    . SHOULDER SURGERY    . TONSILLECTOMY    . WRIST SURGERY      Home Medications:  Allergies as of 01/12/2017   No Known Allergies     Medication List        Accurate as of 01/12/17  2:18 PM. Always use your most recent med list.          acetaminophen 325 MG tablet Commonly known as:  TYLENOL Take 2 tablets (650 mg total) every 6 (six) hours as needed by mouth for mild pain (or Fever >/= 101).   amLODipine 10 MG tablet Commonly known as:  NORVASC Take 1 tablet (10 mg total) by mouth daily.   atorvastatin 10 MG tablet Commonly known as:  LIPITOR Take 5 mg by mouth daily.   clopidogrel 75 MG tablet Commonly known as:  PLAVIX Take 1 tablet (  75 mg total) by mouth daily.   docusate sodium 100 MG capsule Commonly known as:  COLACE Take 1 capsule (100 mg total) by mouth 2 (two) times daily as needed for mild constipation.   escitalopram 20 MG tablet Commonly known as:  LEXAPRO Take 1 tablet (20 mg total) by mouth daily.   esomeprazole 20 MG capsule Commonly known as:  NEXIUM Take 1 capsule (20 mg total) by mouth AC breakfast.   furosemide 40 MG tablet Commonly known as:  LASIX Take 1 tablet (40 mg total) by mouth daily.   gabapentin 300 MG capsule Commonly known as:  NEURONTIN Take 300 mg by mouth at bedtime.   hyoscyamine 0.375 MG 12 hr tablet Commonly known as:  LEVBID Take 0.375 mg by mouth every 12 (twelve) hours.   isosorbide mononitrate 60 MG 24 hr tablet Commonly known as:  IMDUR Take 1 tablet (60 mg total) by mouth 2 (two) times daily.   losartan 100 MG tablet Commonly known as:  COZAAR Take 100 mg by mouth daily.   magnesium oxide 400 MG  tablet Commonly known as:  MAG-OX Take 400 mg by mouth daily.   metFORMIN 1000 MG tablet Commonly known as:  GLUCOPHAGE Take 1 tablet (1,000 mg total) by mouth daily with breakfast.   nitroGLYCERIN 0.4 MG SL tablet Commonly known as:  NITROSTAT Place 0.4 mg under the tongue every 5 (five) minutes as needed for chest pain.   primidone 50 MG tablet Commonly known as:  MYSOLINE Take 1 tablet by mouth 2 (two) times daily.   propranolol 20 MG tablet Commonly known as:  INDERAL Take 20 mg by mouth 2 (two) times daily.   tamsulosin 0.4 MG Caps capsule Commonly known as:  FLOMAX Take 0.4 mg by mouth daily.   TRESIBA FLEXTOUCH 100 UNIT/ML Sopn FlexTouch Pen Generic drug:  insulin degludec Inject 28-30 Units into the skin daily at 10 pm. 28 units on the weekdays and 30 units on the weekends   vitamin B-12 1000 MCG tablet Commonly known as:  CYANOCOBALAMIN Take 1,000 mcg by mouth daily.       Allergies: No Known Allergies  Family History: Family History  Problem Relation Age of Onset  . Heart disease Mother   . Heart disease Father   . Diabetes Father   . Stroke Father   . Kidney disease Neg Hx   . Prostate cancer Neg Hx     Social History:  reports that he has been smoking cigarettes.  He has been smoking about 1.00 pack per day. he has never used smokeless tobacco. He reports that he drinks about 1.2 oz of alcohol per week. He reports that he does not use drugs.  ROS: UROLOGY Frequent Urination?: Yes Hard to postpone urination?: Yes Burning/pain with urination?: No Get up at night to urinate?: Yes Leakage of urine?: No Urine stream starts and stops?: No Trouble starting stream?: No Do you have to strain to urinate?: No Blood in urine?: No Urinary tract infection?: No Sexually transmitted disease?: No Injury to kidneys or bladder?: No Painful intercourse?: No Weak stream?: No Erection problems?: Yes Penile pain?: No  Gastrointestinal Nausea?: No Vomiting?:  No Indigestion/heartburn?: No Diarrhea?: No Constipation?: No  Constitutional Fever: No Night sweats?: No Weight loss?: No Fatigue?: No  Skin Skin rash/lesions?: No Itching?: No  Eyes Blurred vision?: No Double vision?: No  Ears/Nose/Throat Sore throat?: No Sinus problems?: No  Hematologic/Lymphatic Swollen glands?: No Easy bruising?: Yes  Cardiovascular Leg swelling?: No Chest  pain?: No  Respiratory Cough?: No Shortness of breath?: No  Endocrine Excessive thirst?: No  Musculoskeletal Back pain?: No Joint pain?: No  Neurological Headaches?: No Dizziness?: No  Psychologic Depression?: Yes Anxiety?: No  Physical Exam: BP 139/65   Pulse 79   Ht 5\' 9"  (1.753 m)   Wt 178 lb (80.7 kg)   BMI 26.29 kg/m   Constitutional:  Alert and oriented, No acute distress.  Ambulating with cane.  Accompanied by wife today.  Pale.  Appears unwell. HEENT: [place] AT, moist mucus membranes.  Trachea midline, no masses. Cardiovascular: No clubbing, cyanosis, or edema. Respiratory: Normal respiratory effort, no increased work of breathing. GI: Abdomen is soft, nontender, nondistended, no abdominal masses Rectal exam: Deferred today, last rectal 6 months ago without nodules Neurologic: Grossly intact, no focal deficits, moving all 4 extremities. Psychiatric: Normal mood and affect.  Laboratory Data: Lab Results  Component Value Date   WBC 7.2 12/18/2016   HGB 13.7 12/18/2016   HCT 40.2 12/18/2016   MCV 92.7 12/18/2016   PLT 135 (L) 12/18/2016    Lab Results  Component Value Date   CREATININE 1.16 12/18/2016   PSA trend as above  Pertinent Imaging: n/a  Prostate MRI personally reviewed today and with the patient.  Assessment & Plan:    1. Prostate cancer [facility]) On active surveillance Most recent MRI 1/18 negative- subclinical nodes, no area of obvious high risk diease PSA stable up but history of fluctuation given multiple medical comorbidities, we'll  continue to follow closely Consider following q6 months with serial PSA/ DRE (annually) - PSA - Urinalysis, Complete  2. Urge incontinence/ OAB Significant baseline urinary urgency and urge incontinence.  Has not implamented any behavioral modification as discussed- continues to drink coffee, beer, etc on regular basis   No role for medical/ procedure intervention unless implements behavioral modification- unchanged from previous    Return in about 6 months (around 07/12/2017) for PSA/ DRE.   Hollice Espy, MD  [facility] [place], [place] [address] [phone]

## 2017-07-12 ENCOUNTER — Other Ambulatory Visit: Payer: Self-pay

## 2017-07-12 ENCOUNTER — Other Ambulatory Visit
Admission: RE | Admit: 2017-07-12 | Discharge: 2017-07-12 | Disposition: A | Discharge status: Home / Self Care | Payer: Medicare Other | Source: Ambulatory Visit | Attending: Urology | Admitting: Urology

## 2017-07-12 DIAGNOSIS — C61 Malignant neoplasm of prostate: Secondary | ICD-10-CM | POA: Insufficient documentation

## 2017-07-13 ENCOUNTER — Encounter: Payer: Self-pay | Admitting: Urology

## 2017-07-13 ENCOUNTER — Ambulatory Visit (INDEPENDENT_AMBULATORY_CARE_PROVIDER_SITE_OTHER): Payer: Medicare Other | Admitting: Urology

## 2017-07-13 VITALS — BP 148/67 | HR 58 | Resp 16 | Ht 68.0 in | Wt 178.0 lb

## 2017-07-13 DIAGNOSIS — C61 Malignant neoplasm of prostate: Secondary | ICD-10-CM | POA: Diagnosis not present

## 2017-07-13 DIAGNOSIS — N3281 Overactive bladder: Secondary | ICD-10-CM

## 2017-07-13 LAB — PSA: Prostatic Specific Antigen: 10.78 ng/mL — ABNORMAL HIGH (ref 0.00–4.00)

## 2017-07-13 NOTE — Progress Notes (Signed)
07/13/2017 3:01 PM   Richard Chapman 26-Jan-1941 [id number]  Referring provider: Leonel Ramsay, MD [address] [place], [place] [address]  Chief Complaint  Patient presents with  . Prostate Cancer    HPI: 77 year old male with prostate cancer on active surveillance returns today for follow-up.      Prostate cancer  Elevated PSA to 9.1 dx 09/2014 with Gleason 3+3 in 2 cores involving the right apex, up to 23% of tissue.  Rectal exam was benign. TRUS volume 60 cc.     MRI performed on 02/22/2016 shows no evidence of obvious high-grade lesions.  He does have nodular transition zone.  Prostatic capsule is intact.. He does have some subclinical lymph nodes in the bilateral iliac measuring 6 mm.  Gland size 66 cc.    PSA trend below.  Most recent PSA stable at 10.78 as of yesterday.  OAB/ urge incontinence He does have urinary frequency, urgency, and nocturia which is fairly significant. He now wear depends for both urge incontinence as well as fecal incontinence.  He failed Mybetriq 25 mg, Vesicare 5 mg and most recently tried Mybetriq 50 mg daily which did not help enough.    He does admit to continued drinking of coffee, tea, sodas and beer on a regular basis (has not cut back).   This is unchanged again today.     ED He also has severe baseline ED.    PMH: Past Medical History:  Diagnosis Date  . Anxiety and depression   . Depression   . Diabetes ([place])   . Elevated PSA   . GERD (gastroesophageal reflux disease)   . History of nephrolithiasis   . Hypertension   . Skin cancer   . Sleep apnea     Surgical History: Past Surgical History:  Procedure Laterality Date  . APPENDECTOMY    . BACK SURGERY    . BURR HOLE FOR SUBDURAL HEMATOMA     2015  . KNEE SURGERY Bilateral   . NECK SURGERY    . SHOULDER SURGERY    . TONSILLECTOMY    . WRIST SURGERY      Home Medications:  Allergies as of 07/13/2017   No Known Allergies     Medication List        Accurate as of 07/13/17  3:01 PM. Always use your most recent med list.          acetaminophen 325 MG tablet Commonly known as:  TYLENOL Take 2 tablets (650 mg total) every 6 (six) hours as needed by mouth for mild pain (or Fever >/= 101).   amLODipine 10 MG tablet Commonly known as:  NORVASC Take 1 tablet (10 mg total) by mouth daily.   atorvastatin 10 MG tablet Commonly known as:  LIPITOR Take 5 mg by mouth daily.   cetirizine 10 MG tablet Commonly known as:  ZYRTEC Take 10 mg by mouth daily.   cholecalciferol 400 units Tabs tablet Commonly known as:  VITAMIN D Take 2,000 Units by mouth.   clopidogrel 75 MG tablet Commonly known as:  PLAVIX Take 1 tablet (75 mg total) by mouth daily.   docusate sodium 100 MG capsule Commonly known as:  COLACE Take 1 capsule (100 mg total) by mouth 2 (two) times daily as needed for mild constipation.   escitalopram 20 MG tablet Commonly known as:  LEXAPRO Take 1 tablet (20 mg total) by mouth daily.   esomeprazole 20 MG capsule Commonly known as:  NEXIUM Take 1  capsule (20 mg total) by mouth AC breakfast.   gabapentin 300 MG capsule Commonly known as:  NEURONTIN Take 300 mg by mouth at bedtime.   hydrochlorothiazide 12.5 MG capsule Commonly known as:  MICROZIDE Take 12.5 mg by mouth daily.   hyoscyamine 0.375 MG 12 hr tablet Commonly known as:  LEVBID Take 0.375 mg by mouth every 12 (twelve) hours.   isosorbide mononitrate 60 MG 24 hr tablet Commonly known as:  IMDUR Take 1 tablet (60 mg total) by mouth 2 (two) times daily.   losartan 100 MG tablet Commonly known as:  COZAAR Take 100 mg by mouth daily.   magnesium oxide 400 MG tablet Commonly known as:  MAG-OX Take 400 mg by mouth daily.   metFORMIN 1000 MG tablet Commonly known as:  GLUCOPHAGE Take 1 tablet (1,000 mg total) by mouth daily with breakfast.   nitroGLYCERIN 0.4 MG SL tablet Commonly known as:  NITROSTAT Place 0.4 mg under the tongue every 5  (five) minutes as needed for chest pain.   primidone 50 MG tablet Commonly known as:  MYSOLINE Take 1 tablet by mouth 2 (two) times daily.   propranolol 20 MG tablet Commonly known as:  INDERAL Take 20 mg by mouth 2 (two) times daily.   ranitidine 150 MG capsule Commonly known as:  ZANTAC Take 150 mg by mouth 2 (two) times daily.   tamsulosin 0.4 MG Caps capsule Commonly known as:  FLOMAX Take 0.4 mg by mouth daily.   TRESIBA FLEXTOUCH 100 UNIT/ML Sopn FlexTouch Pen Generic drug:  insulin degludec Inject 28-30 Units into the skin daily at 10 pm. 28 units on the weekdays and 30 units on the weekends   vitamin B-12 1000 MCG tablet Commonly known as:  CYANOCOBALAMIN Take 1,000 mcg by mouth daily.       Allergies: No Known Allergies  Family History: Family History  Problem Relation Age of Onset  . Heart disease Mother   . Heart disease Father   . Diabetes Father   . Stroke Father   . Kidney disease Neg Hx   . Prostate cancer Neg Hx     Social History:  reports that he has been smoking cigarettes.  He has been smoking about 1.00 pack per day. He has never used smokeless tobacco. He reports that he drinks about 1.2 oz of alcohol per week. He reports that he does not use drugs.  ROS: UROLOGY Frequent Urination?: Yes Hard to postpone urination?: Yes Burning/pain with urination?: No Get up at night to urinate?: Yes Leakage of urine?: No Urine stream starts and stops?: Yes Trouble starting stream?: No Do you have to strain to urinate?: No Blood in urine?: No Urinary tract infection?: No Sexually transmitted disease?: No Injury to kidneys or bladder?: No Painful intercourse?: No Weak stream?: No Erection problems?: Yes Penile pain?: No  Gastrointestinal Nausea?: No Vomiting?: No Indigestion/heartburn?: No Diarrhea?: No Constipation?: No  Constitutional Fever: No Night sweats?: No Weight loss?: No Fatigue?: Yes  Skin Skin rash/lesions?: No Itching?:  No  Eyes Blurred vision?: No Double vision?: No  Ears/Nose/Throat Sore throat?: No Sinus problems?: No  Hematologic/Lymphatic Swollen glands?: No Easy bruising?: Yes  Cardiovascular Leg swelling?: No Chest pain?: No  Respiratory Cough?: No Shortness of breath?: No  Endocrine Excessive thirst?: No  Musculoskeletal Back pain?: No Joint pain?: No  Neurological Headaches?: No Dizziness?: No  Psychologic Depression?: No Anxiety?: No  Physical Exam: BP (!) 148/67   Pulse (!) 58   Resp 16  Ht 5\' 8"  (1.727 m)   Wt 178 lb (80.7 kg)   SpO2 96%   BMI 27.06 kg/m   Constitutional:  Alert and oriented, No acute distress.  Covered by wife today.  In wheelchair. HEENT: [place] AT, moist mucus membranes.  Trachea midline, no masses. Respiratory: Normal respiratory effort, no increased work of breathing.  GI: Abdomen is soft, nontender, nondistended, no abdominal masses GU: No CVA tenderness Rectal: Normal sphincter tone, stool in rectal vault, enlarged prostate, nontender no obvious nodules.  Due to positioning and habitus, unable to palpate the base of prostate. Neurologic: Grossly intact, no focal deficits, moving all 4 extremities.  Unsteady gait. Psychiatric: Normal mood and affect.  Laboratory Data: Lab Results  Component Value Date   WBC 7.2 12/18/2016   HGB 13.7 12/18/2016   HCT 40.2 12/18/2016   MCV 92.7 12/18/2016   PLT 135 (L) 12/18/2016    Lab Results  Component Value Date   CREATININE 1.16 12/18/2016    Component     Latest Ref Rng & Units 01/09/2017 07/12/2017  Prostatic Specific Antigen     0.00 - 4.00 ng/mL 11.98 (H) 10.78 (H)    Lab Results  Component Value Date   HGBA1C 7.4 (H) 09/24/2015    Urinalysis N/a  Pertinent Imaging: n/a  Assessment & Plan:    1. Prostate cancer [facility]) On active surveillance Most recent MRI 1/18 negative- subclinical nodes, no area of obvious high risk diease PSA stable up but history of fluctuation given  multiple medical comorbidities, continue to manage conservatively Will monitor PSA biannually, PSA DRE annually - PSA   Return in about 1 year (around 07/14/2018) for PSA (lab only in 6 mo), and MD visit 12 mo PSA/ DRE.  Hollice Espy, MD  [facility] [address] [place], [place] [address] [phone]

## 2018-01-12 IMAGING — MR MR HEAD W/O CM
10 series · 41 of 48 positions shown · non-contrast
Comparison: 12/09/2014

CLINICAL DATA: Cerebrum stroke

EXAM:
MRI HEAD WITHOUT CONTRAST
TECHNIQUE: Multiplanar, multiecho pulse sequences of the brain and surrounding
structures were obtained without intravenous contrast.

[Series 2: T1 · sagittal · 5.0mm · 0.45mm/px · 3 of 29 slices shown (1 of 2)]
[im 1/29]
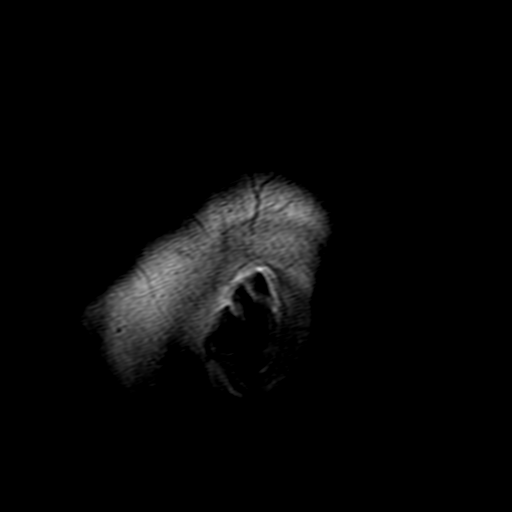
[im 15/29]
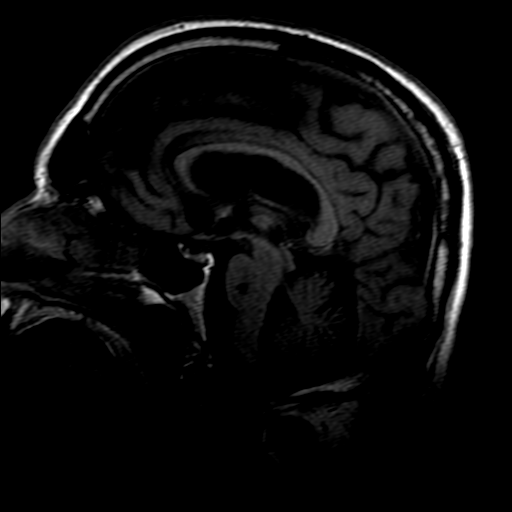
[im 29/29]
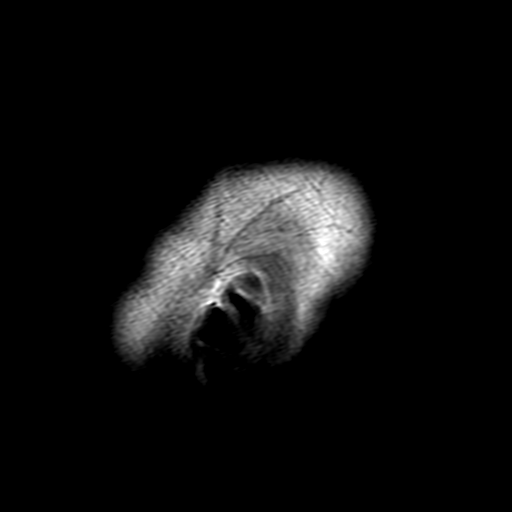

[Series 4: DWI · axial · 4.0mm · 0.94mm/px · z∈[-39,+121]mm · 5 of 45 slices shown (1 of 4)]
[im 1/45]
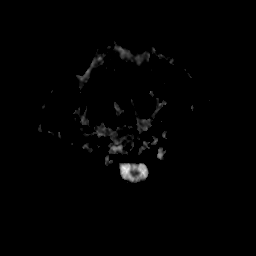
[im 12/45]
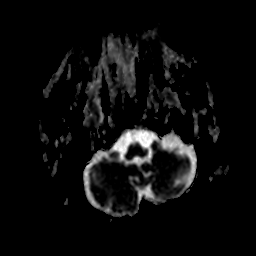
[im 23/45]
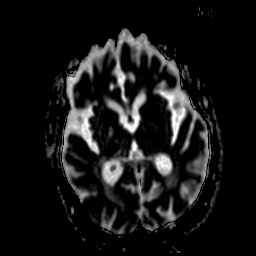
[im 34/45]
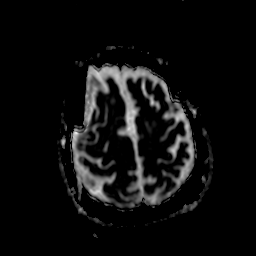
[im 45/45]
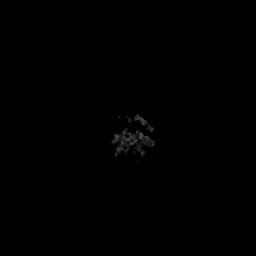

[Series 5: DWI · axial · 4.0mm · 0.94mm/px · z∈[-39,+121]mm · 6 of 45 slices shown (2 of 4)]
[im 1/45]
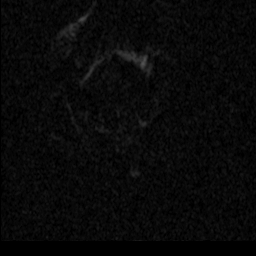
[im 9/45]
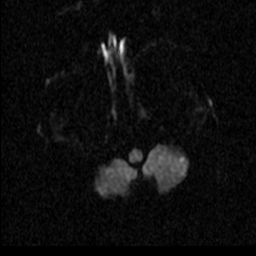
[im 18/45]
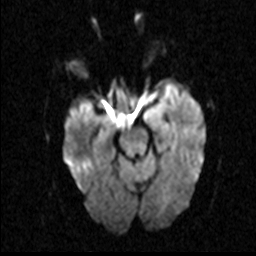
[im 27/45]
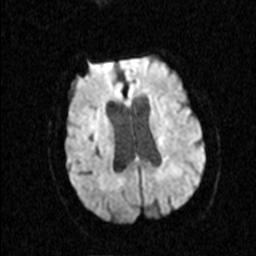
[im 36/45]
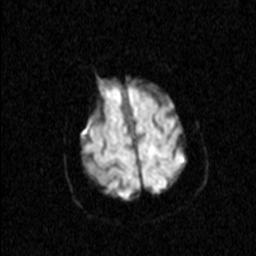
[im 45/45]
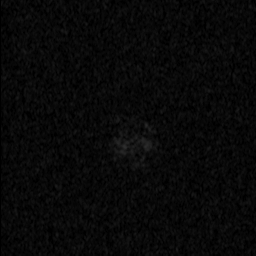

[Series 7: DWI · coronal · 5.0mm · 1.80mm/px · 5 of 41 slices shown (3 of 4)]
[im 1/41]
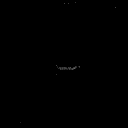
[im 11/41]
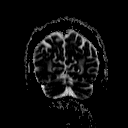
[im 21/41]
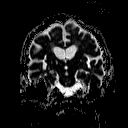
[im 31/41]
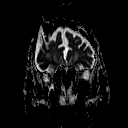
[im 41/41]
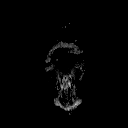

[Series 8: DWI · coronal · 5.0mm · 1.80mm/px · 5 of 38 slices shown (4 of 4)]
[im 1/38]
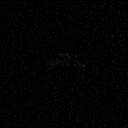
[im 10/38]
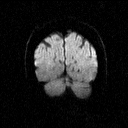
[im 19/38]
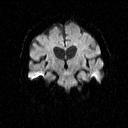
[im 28/38]
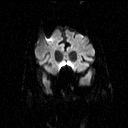
[im 38/38]
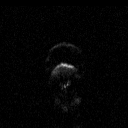

[Series 9: T2 · axial · 5.0mm · 0.45mm/px · z∈[-38,+115]mm · 4 of 27 slices shown (1 of 3)]
[im 1/27]
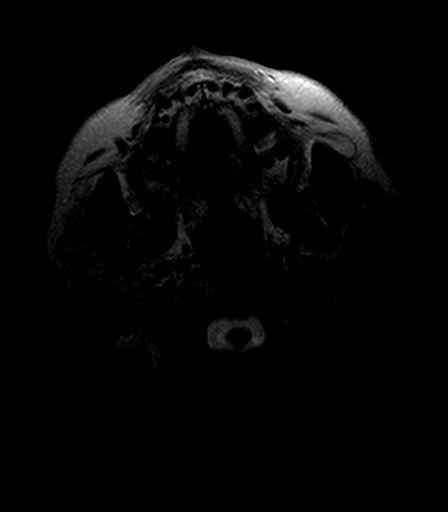
[im 9/27]
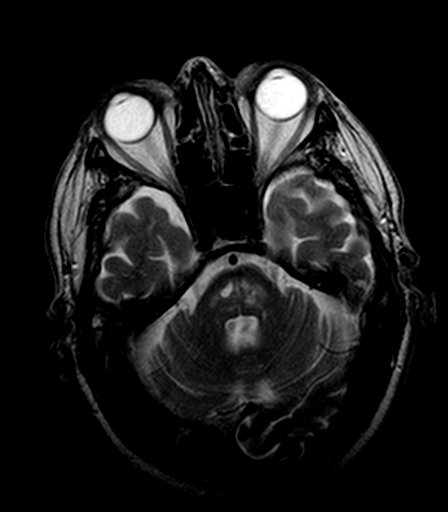
[im 18/27]
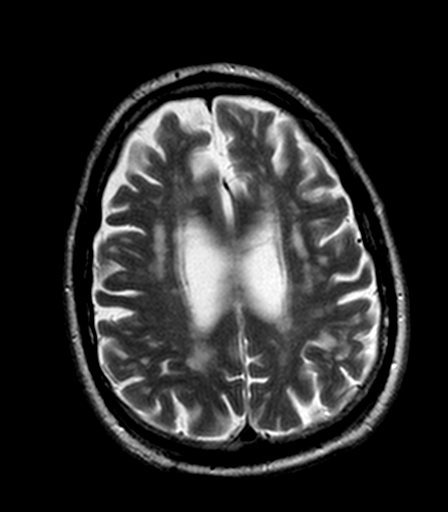
[im 27/27]
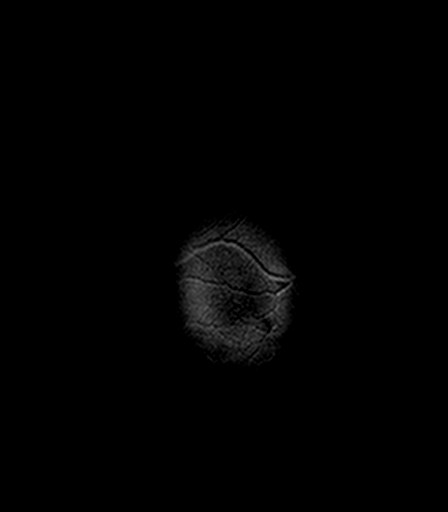

[Series 10: FLAIR · axial · 5.0mm · 0.90mm/px · z∈[-38,+115]mm · 4 of 27 slices shown]
[im 1/27]
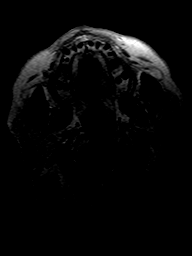
[im 9/27]
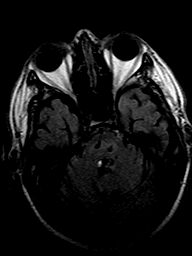
[im 18/27]
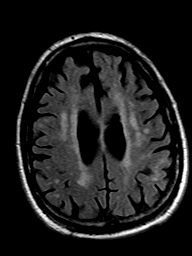
[im 27/27]
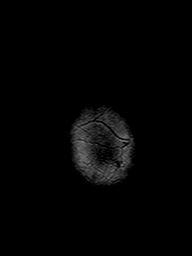

[Series 11: T2 · axial · 5.0mm · 0.45mm/px · z∈[-38,+115]mm · 4 of 27 slices shown (2 of 3)]
[im 1/27]
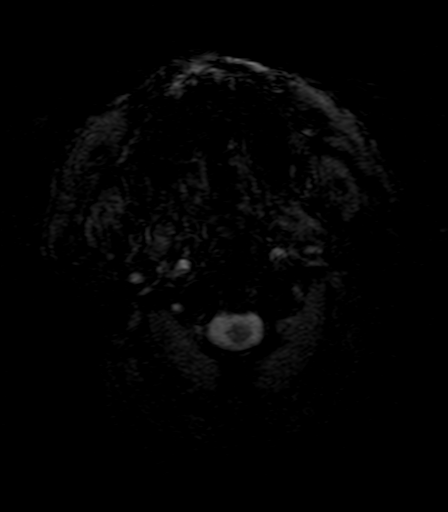
[im 9/27]
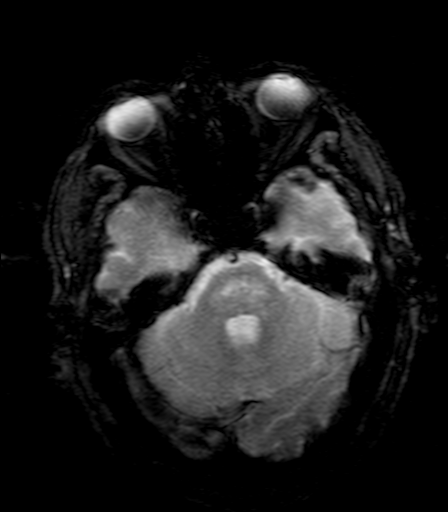
[im 18/27]
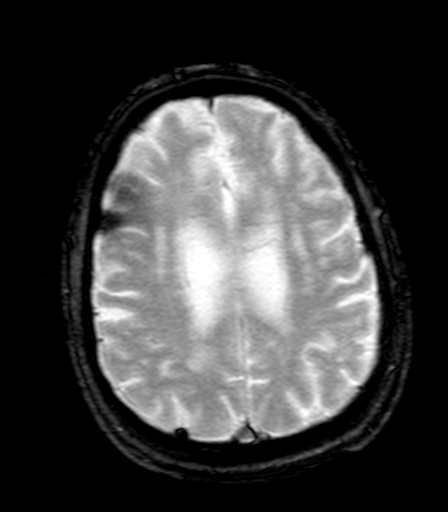
[im 27/27]
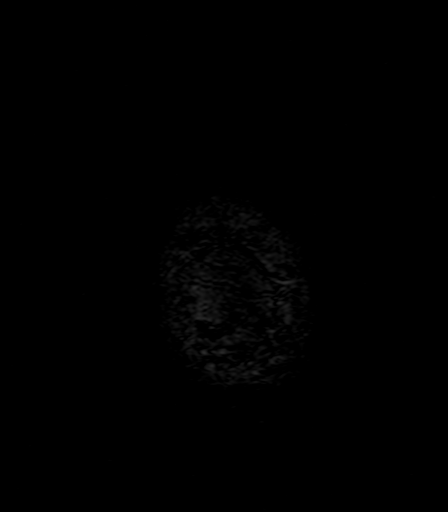

[Series 12: T1 · axial · 3.0mm · 0.45mm/px · 1 of 60 slices shown (2 of 2)]
[im 1/60]
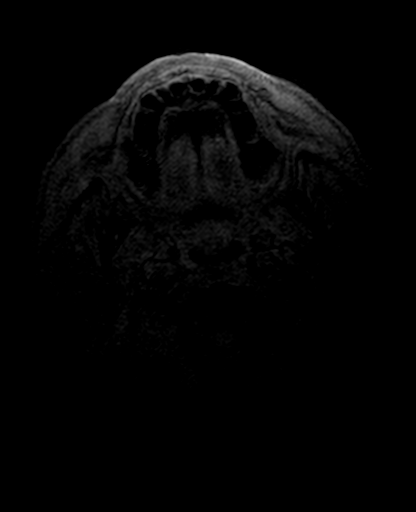

[Series 13: T2 · coronal · 5.0mm · 0.45mm/px · 4 of 31 slices shown (3 of 3)]
[im 1/31]
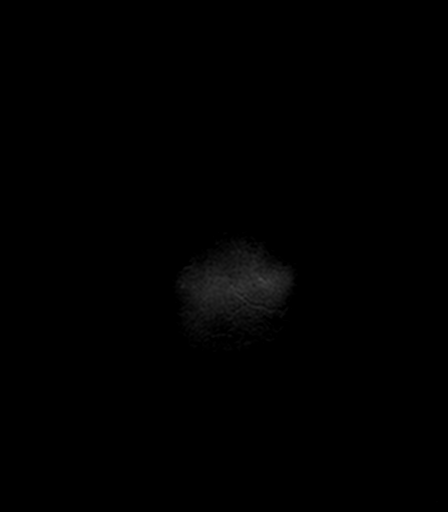
[im 11/31]
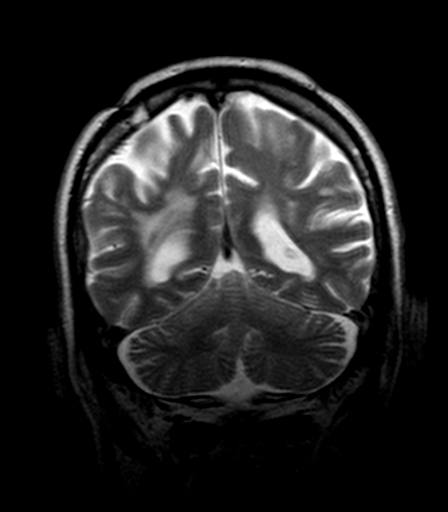
[im 21/31]
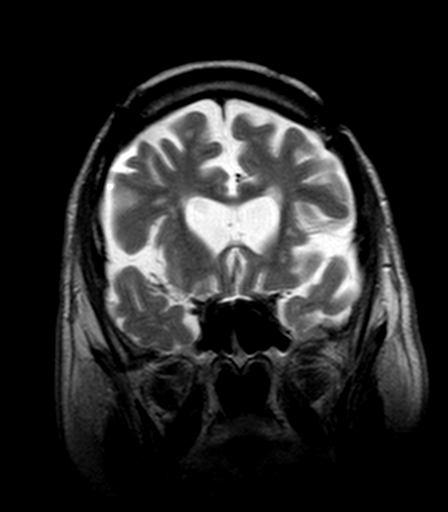
[im 31/31]
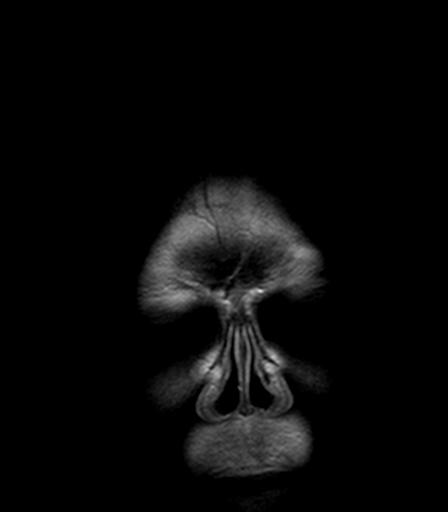

[41 of 48 positions shown; findings below may reference images not displayed]

FINDINGS: Calvarium and upper cervical spine: No focal marrow signal
abnormality. Remote bilateral burr holes.

Orbits: Negative.

Sinuses and Mastoids: Clear.

Brain: There is a sub cm area of diffusion hyperintensity
parasagittal left frontal lobe on coronal reformats, but not
convincing on axial acquisition. The restriction is somewhat weak
for acute infarct.

Chronic small vessel disease with confluent ischemic gliosis in the
cerebral white matter. Multiple remote lacunar infarcts, including
in the right centrum semiovale, left corona radiata, right posterior
limb internal capsule, and bilateral brainstem.

Normal cerebral volume. No evidence of hemorrhage, hydrocephalus, or
major vessel occlusion.
IMPRESSION: 1. Probable acute to subacute small-vessel infarct in the
parasagittal left frontal cortex.
2. Advanced chronic small vessel disease with multiple remote
lacunar infarcts.

## 2018-04-13 ENCOUNTER — Emergency Department: Payer: Medicare Other

## 2018-04-13 ENCOUNTER — Inpatient Hospital Stay
Admission: EM | Admit: 2018-04-13 | Discharge: 2018-04-19 | DRG: 682 | Disposition: A | Discharge status: Skilled Nursing Facility | Payer: Medicare Other | Attending: Internal Medicine | Admitting: Internal Medicine

## 2018-04-13 ENCOUNTER — Other Ambulatory Visit: Payer: Self-pay

## 2018-04-13 DIAGNOSIS — N179 Acute kidney failure, unspecified: Principal | ICD-10-CM | POA: Diagnosis present

## 2018-04-13 DIAGNOSIS — M79604 Pain in right leg: Secondary | ICD-10-CM | POA: Diagnosis not present

## 2018-04-13 DIAGNOSIS — E1151 Type 2 diabetes mellitus with diabetic peripheral angiopathy without gangrene: Secondary | ICD-10-CM | POA: Diagnosis present

## 2018-04-13 DIAGNOSIS — R531 Weakness: Secondary | ICD-10-CM | POA: Diagnosis present

## 2018-04-13 DIAGNOSIS — Z833 Family history of diabetes mellitus: Secondary | ICD-10-CM

## 2018-04-13 DIAGNOSIS — F17210 Nicotine dependence, cigarettes, uncomplicated: Secondary | ICD-10-CM | POA: Diagnosis present

## 2018-04-13 DIAGNOSIS — Z87442 Personal history of urinary calculi: Secondary | ICD-10-CM

## 2018-04-13 DIAGNOSIS — Z9181 History of falling: Secondary | ICD-10-CM

## 2018-04-13 DIAGNOSIS — Z794 Long term (current) use of insulin: Secondary | ICD-10-CM

## 2018-04-13 DIAGNOSIS — R6510 Systemic inflammatory response syndrome (SIRS) of non-infectious origin without acute organ dysfunction: Secondary | ICD-10-CM | POA: Diagnosis present

## 2018-04-13 DIAGNOSIS — J9601 Acute respiratory failure with hypoxia: Secondary | ICD-10-CM | POA: Diagnosis present

## 2018-04-13 DIAGNOSIS — J189 Pneumonia, unspecified organism: Secondary | ICD-10-CM | POA: Diagnosis present

## 2018-04-13 DIAGNOSIS — Z823 Family history of stroke: Secondary | ICD-10-CM

## 2018-04-13 DIAGNOSIS — I129 Hypertensive chronic kidney disease with stage 1 through stage 4 chronic kidney disease, or unspecified chronic kidney disease: Secondary | ICD-10-CM | POA: Diagnosis present

## 2018-04-13 DIAGNOSIS — N183 Chronic kidney disease, stage 3 (moderate): Secondary | ICD-10-CM | POA: Diagnosis present

## 2018-04-13 DIAGNOSIS — Z8249 Family history of ischemic heart disease and other diseases of the circulatory system: Secondary | ICD-10-CM

## 2018-04-13 DIAGNOSIS — R071 Chest pain on breathing: Secondary | ICD-10-CM | POA: Diagnosis present

## 2018-04-13 DIAGNOSIS — F05 Delirium due to known physiological condition: Secondary | ICD-10-CM | POA: Diagnosis not present

## 2018-04-13 DIAGNOSIS — Z8673 Personal history of transient ischemic attack (TIA), and cerebral infarction without residual deficits: Secondary | ICD-10-CM

## 2018-04-13 DIAGNOSIS — Z7902 Long term (current) use of antithrombotics/antiplatelets: Secondary | ICD-10-CM

## 2018-04-13 DIAGNOSIS — E785 Hyperlipidemia, unspecified: Secondary | ICD-10-CM | POA: Diagnosis present

## 2018-04-13 DIAGNOSIS — A419 Sepsis, unspecified organism: Secondary | ICD-10-CM | POA: Diagnosis present

## 2018-04-13 DIAGNOSIS — E1165 Type 2 diabetes mellitus with hyperglycemia: Secondary | ICD-10-CM | POA: Diagnosis present

## 2018-04-13 DIAGNOSIS — R52 Pain, unspecified: Secondary | ICD-10-CM

## 2018-04-13 DIAGNOSIS — I2510 Atherosclerotic heart disease of native coronary artery without angina pectoris: Secondary | ICD-10-CM | POA: Diagnosis present

## 2018-04-13 DIAGNOSIS — I248 Other forms of acute ischemic heart disease: Secondary | ICD-10-CM | POA: Diagnosis present

## 2018-04-13 DIAGNOSIS — K219 Gastro-esophageal reflux disease without esophagitis: Secondary | ICD-10-CM | POA: Diagnosis present

## 2018-04-13 DIAGNOSIS — F329 Major depressive disorder, single episode, unspecified: Secondary | ICD-10-CM | POA: Diagnosis present

## 2018-04-13 DIAGNOSIS — E1122 Type 2 diabetes mellitus with diabetic chronic kidney disease: Secondary | ICD-10-CM | POA: Diagnosis present

## 2018-04-13 DIAGNOSIS — R079 Chest pain, unspecified: Secondary | ICD-10-CM | POA: Diagnosis not present

## 2018-04-13 DIAGNOSIS — K5900 Constipation, unspecified: Secondary | ICD-10-CM | POA: Diagnosis present

## 2018-04-13 DIAGNOSIS — Z85828 Personal history of other malignant neoplasm of skin: Secondary | ICD-10-CM

## 2018-04-13 DIAGNOSIS — R443 Hallucinations, unspecified: Secondary | ICD-10-CM | POA: Diagnosis not present

## 2018-04-13 DIAGNOSIS — R0602 Shortness of breath: Secondary | ICD-10-CM

## 2018-04-13 DIAGNOSIS — R0682 Tachypnea, not elsewhere classified: Secondary | ICD-10-CM | POA: Diagnosis present

## 2018-04-13 LAB — COMPREHENSIVE METABOLIC PANEL
ALT: 15 U/L (ref 0–44)
AST: 17 U/L (ref 15–41)
Albumin: 3.7 g/dL (ref 3.5–5.0)
Alkaline Phosphatase: 62 U/L (ref 38–126)
Anion gap: 10 (ref 5–15)
BUN: 28 mg/dL — AB (ref 8–23)
CHLORIDE: 101 mmol/L (ref 98–111)
CO2: 25 mmol/L (ref 22–32)
CREATININE: 1.53 mg/dL — AB (ref 0.61–1.24)
Calcium: 9.3 mg/dL (ref 8.9–10.3)
GFR calc non Af Amer: 43 mL/min — ABNORMAL LOW (ref >60)
GFR, EST AFRICAN AMERICAN: 49 mL/min — AB (ref >60)
Glucose, Bld: 142 mg/dL — ABNORMAL HIGH (ref 70–99)
Potassium: 4.1 mmol/L (ref 3.5–5.1)
SODIUM: 136 mmol/L (ref 135–145)
Total Bilirubin: 1 mg/dL (ref 0.3–1.2)
Total Protein: 6.7 g/dL (ref 6.5–8.1)

## 2018-04-13 LAB — TROPONIN I

## 2018-04-13 LAB — CBC
HCT: 40.5 % (ref 39.0–52.0)
Hemoglobin: 13.8 g/dL (ref 13.0–17.0)
MCH: 31.1 pg (ref 26.0–34.0)
MCHC: 34.1 g/dL (ref 30.0–36.0)
MCV: 91.2 fL (ref 80.0–100.0)
NRBC: 0 % (ref 0.0–0.2)
PLATELETS: 191 10*3/uL (ref 150–400)
RBC: 4.44 MIL/uL (ref 4.22–5.81)
RDW: 13.9 % (ref 11.5–15.5)
WBC: 12 10*3/uL — ABNORMAL HIGH (ref 4.0–10.5)

## 2018-04-13 LAB — BRAIN NATRIURETIC PEPTIDE: B NATRIURETIC PEPTIDE 5: 79 pg/mL (ref 0.0–100.0)

## 2018-04-13 MED ORDER — SODIUM CHLORIDE 0.9 % IV SOLN
1.0000 g | INTRAVENOUS | Status: DC
  Filled 2018-04-13: qty 10

## 2018-04-13 MED ORDER — SODIUM CHLORIDE 0.9 % IV SOLN
500.0000 mg | INTRAVENOUS | Status: DC
  Filled 2018-04-13: qty 500

## 2018-04-13 MED ORDER — MORPHINE SULFATE (PF) 2 MG/ML IV SOLN
2.0000 mg | INTRAVENOUS | Status: DC | PRN
  Administered 2018-04-15: 2 mg via INTRAVENOUS
  Filled 2018-04-13: qty 1

## 2018-04-13 MED ORDER — MORPHINE SULFATE (PF) 4 MG/ML IV SOLN
4.0000 mg | Freq: Once | INTRAVENOUS | Status: AC
  Administered 2018-04-13: 4 mg via INTRAVENOUS
  Filled 2018-04-13: qty 1

## 2018-04-13 MED ORDER — MORPHINE SULFATE (PF) 4 MG/ML IV SOLN
4.0000 mg | INTRAVENOUS | Status: DC | PRN
  Administered 2018-04-15: 4 mg via INTRAVENOUS
  Filled 2018-04-13: qty 1

## 2018-04-13 MED ORDER — METOPROLOL TARTRATE 5 MG/5ML IV SOLN
5.0000 mg | Freq: Once | INTRAVENOUS | Status: AC
  Administered 2018-04-13: 5 mg via INTRAVENOUS
  Filled 2018-04-13: qty 5

## 2018-04-13 MED ORDER — ASPIRIN 81 MG PO CHEW
81.0000 mg | CHEWABLE_TABLET | Freq: Every day | ORAL | Status: DC
  Administered 2018-04-14 – 2018-04-19 (×6): 81 mg via ORAL
  Filled 2018-04-13 (×6): qty 1

## 2018-04-13 MED ORDER — NITROGLYCERIN 0.4 MG SL SUBL
0.4000 mg | SUBLINGUAL_TABLET | SUBLINGUAL | Status: DC | PRN
  Administered 2018-04-13: 0.4 mg via SUBLINGUAL
  Filled 2018-04-13: qty 1

## 2018-04-13 MED ORDER — NITROGLYCERIN 0.4 MG SL SUBL
0.4000 mg | SUBLINGUAL_TABLET | SUBLINGUAL | Status: DC | PRN
  Administered 2018-04-15 (×2): 0.4 mg via SUBLINGUAL
  Filled 2018-04-13: qty 1

## 2018-04-13 MED ORDER — SODIUM CHLORIDE 0.9 % IV SOLN
1.0000 g | Freq: Once | INTRAVENOUS | Status: AC
Start: 2018-04-13 — End: 2018-04-14
  Administered 2018-04-13: 1 g via INTRAVENOUS
  Filled 2018-04-13: qty 10

## 2018-04-13 MED ORDER — ONDANSETRON HCL 4 MG/2ML IJ SOLN
4.0000 mg | Freq: Once | INTRAMUSCULAR | Status: AC
  Administered 2018-04-13: 4 mg via INTRAVENOUS
  Filled 2018-04-13: qty 2

## 2018-04-13 MED ORDER — SODIUM CHLORIDE 0.9 % IV SOLN
500.0000 mg | Freq: Once | INTRAVENOUS | Status: AC
  Administered 2018-04-14: 500 mg via INTRAVENOUS
  Filled 2018-04-13: qty 500

## 2018-04-13 MED ORDER — ASPIRIN 325 MG PO TABS
325.0000 mg | ORAL_TABLET | Freq: Once | ORAL | Status: AC
  Administered 2018-04-13: 325 mg via ORAL

## 2018-04-13 NOTE — ED Provider Notes (Addendum)
[facility] Emergency Department Provider Note  Time seen: 9:52 PM  I have reviewed the triage vital signs and the nursing notes.   HISTORY  Chief Complaint Chest Pain    HPI Richard Chapman is a 78 y.o. male with a past medical history of anxiety, depression, gastric reflux, hypertension, CVA, presents to the emergency department for chest pain.  According to the patient around 7:00 this morning he developed chest pain describes as around 8/10 pressure to the center of his chest.  States he has been coughing over the past 2 weeks, denies any sputum production.  Denies any significant leg pain or swelling.  No shortness of breath nausea or diaphoresis.  No fever.  Patient states minimal relief after nitroglycerin by EMS.   Past Medical History:  Diagnosis Date  . Anxiety and depression   . Depression   . Diabetes ([place])   . Elevated PSA   . GERD (gastroesophageal reflux disease)   . History of nephrolithiasis   . Hypertension   . Skin cancer   . Sleep apnea     Patient Active Problem List   Diagnosis Date Noted  . Bacteremia 12/15/2016  . Sepsis ([place]) 12/14/2016  . Atypical chest pain 09/24/2015  . Elevated WBC count 05/05/2015  . 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 CVA (cerebral infarction) 04/06/2015  . Cerebral infarction ([place]) 04/06/2015  . Malignant neoplasm of prostate ([place]) 01/29/2015  . Cervical disc disease 12/03/2014  . Chest pain 11/25/2014  . Arthritis 08/20/2014  . Benign fibroma of prostate 08/20/2014  . Clinical depression 08/20/2014  . Failure of erection 08/20/2014  . Enlarged prostate 08/20/2014  . Major depressive disorder with single episode 08/20/2014  . 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  . Benign essential HTN 08/04/2014  . Acute low back pain 04/23/2014  . Alteration in bowel elimination: incontinence 04/23/2014  . Urge incontinence 04/23/2014  . 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  Benign essential tremor 12/23/2013  . B12 deficiency 08/13/2013  . Chronic kidney disease (CKD), stage III (moderate) ([place]) 08/13/2013  . Diabetes ([place]) 08/13/2013  . Hypoglycemia 08/13/2013  . Diabetes mellitus ([place]) 08/13/2013  . Type 2 diabetes mellitus ([place]) 08/13/2013  . Arteriosclerosis of coronary artery 07/24/2013  . Fatigue 07/24/2013  . Acid reflux 07/24/2013  . Combined fat and carbohydrate induced hyperlipemia 07/24/2013  . CAD in native artery 07/24/2013  . Gastro-esophageal reflux disease without esophagitis 07/24/2013  . 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 Subdural hematoma ([place]) 03/26/2013  . Traumatic subdural hemorrhage ([place]) 03/26/2013  . Abnormal prostate specific antigen 09/01/2012  . Benign prostatic hyperplasia with urinary obstruction 09/01/2012  . Elevated prostate specific antigen (PSA) 09/01/2012    Past Surgical History:  Procedure Laterality Date  . APPENDECTOMY    . BACK SURGERY    . BURR HOLE FOR SUBDURAL HEMATOMA     2015  . KNEE SURGERY Bilateral   . NECK SURGERY    . SHOULDER SURGERY    . TONSILLECTOMY    . WRIST SURGERY      Prior to Admission medications   Medication Sig Start Date End Date Taking? Authorizing Provider  acetaminophen (TYLENOL) 325 MG tablet Take 2 tablets (650 mg total) every 6 (six) hours as needed by mouth for mild pain (or Fever >/= 101). 12/18/16   Gouru, Illene Silver, MD  amLODipine (NORVASC) 10 MG tablet Take 1 tablet (10 mg total) by mouth daily. 04/07/15   Fritzi Mandes, MD  atorvastatin (  LIPITOR) 10 MG tablet Take 5 mg by mouth daily.    [provider]  cetirizine (ZYRTEC) 10 MG tablet Take 10 mg by mouth daily.    [provider]  cholecalciferol (VITAMIN D) 400 units TABS tablet Take 2,000 Units by mouth.    [provider]  clopidogrel (PLAVIX) 75 MG tablet Take 1 tablet (75 mg total) by mouth daily. 04/07/15   Fritzi Mandes, MD  docusate sodium (COLACE) 100 MG capsule Take 1  capsule (100 mg total) by mouth 2 (two) times daily as needed for mild constipation. 11/26/14   Gouru, Illene Silver, MD  escitalopram (LEXAPRO) 20 MG tablet Take 1 tablet (20 mg total) by mouth daily. 11/26/14   Nicholes Mango, MD  esomeprazole (NEXIUM) 20 MG capsule Take 1 capsule (20 mg total) by mouth AC breakfast. 09/24/15   Fritzi Mandes, MD  gabapentin (NEURONTIN) 300 MG capsule Take 300 mg by mouth at bedtime.     [provider]  hydrochlorothiazide (MICROZIDE) 12.5 MG capsule Take 12.5 mg by mouth daily.    [provider]  hyoscyamine (LEVBID) 0.375 MG 12 hr tablet Take 0.375 mg by mouth every 12 (twelve) hours.    [provider]  insulin degludec (TRESIBA FLEXTOUCH) 100 UNIT/ML SOPN FlexTouch Pen Inject 28-30 Units into the skin daily at 10 pm. 28 units on the weekdays and 30 units on the weekends    [provider]  isosorbide mononitrate (IMDUR) 60 MG 24 hr tablet Take 1 tablet (60 mg total) by mouth 2 (two) times daily. Patient taking differently: Take 60 mg by mouth daily.  11/26/14   Gouru, Illene Silver, MD  losartan (COZAAR) 100 MG tablet Take 100 mg by mouth daily.     [provider]  magnesium oxide (MAG-OX) 400 MG tablet Take 400 mg by mouth daily.     [provider]  metFORMIN (GLUCOPHAGE) 1000 MG tablet Take 1 tablet (1,000 mg total) by mouth daily with breakfast. Patient taking differently: Take 500 mg by mouth 2 (two) times daily with a meal.  11/28/14   Gouru, Aruna, MD  nitroGLYCERIN (NITROSTAT) 0.4 MG SL tablet Place 0.4 mg under the tongue every 5 (five) minutes as needed for chest pain.     [provider]  primidone (MYSOLINE) 50 MG tablet Take 1 tablet by mouth 2 (two) times daily. 11/04/14   [provider]  propranolol (INDERAL) 20 MG tablet Take 20 mg by mouth 2 (two) times daily.     [provider]  ranitidine (ZANTAC) 150 MG capsule Take 150 mg by mouth 2 (two) times daily.    [provider]   tamsulosin (FLOMAX) 0.4 MG CAPS capsule Take 0.4 mg by mouth daily.     [provider]  vitamin B-12 (CYANOCOBALAMIN) 1000 MCG tablet Take 1,000 mcg by mouth daily.     [provider]    No Known Allergies  Family History  Problem Relation Age of Onset  . Heart disease Mother   . Heart disease Father   . Diabetes Father   . Stroke Father   . Kidney disease Neg Hx   . Prostate cancer Neg Hx     Social History Social History   Tobacco Use  . Smoking status: Current Every Day Smoker    Packs/day: 1.00    Types: Cigarettes  . Smokeless tobacco: Never Used  Substance Use Topics  . Alcohol use: Yes    Alcohol/week: 2.0 standard drinks  Types: 2 Standard drinks or equivalent per week  . Drug use: No    Review of Systems Constitutional: Negative for fever. Cardiovascular: Positive for chest pain Respiratory: Negative for shortness of breath.  Occasional cough. Gastrointestinal: Negative for abdominal pain, vomiting Musculoskeletal: Negative for leg pain or swelling Skin: Negative for skin complaints  Neurological: Negative for headache All other ROS negative  ____________________________________________   PHYSICAL EXAM:  Constitutional: Alert and oriented. Well appearing and in no distress. Eyes: Normal exam ENT   Head: Normocephalic and atraumatic.   Mouth/Throat: Mucous membranes are moist. Cardiovascular: Normal rate, regular rhythm. Respiratory: Normal respiratory effort without tachypnea nor retractions. Breath sounds are clear Gastrointestinal: Soft and nontender. No distention.   Musculoskeletal: Nontender with normal range of motion in all extremities. No lower extremity tenderness or edema. Neurologic:  Normal speech and language. No gross focal neurologic deficits Skin:  Skin is warm, dry and intact.  Psychiatric: Mood and affect are normal.  ____________________________________________    EKG  EKG viewed and interpreted  by myself shows a normal sinus rhythm at 79 bpm with a narrow QRS, mild left axis deviation, largely normal intervals besides mild PR prolongation with nonspecific ST changes.  No ST elevation.  ____________________________________________    RADIOLOGY  Chest x-ray consistent with bilateral pneumonia left greater than right  ____________________________________________   INITIAL IMPRESSION / ASSESSMENT AND PLAN / ED COURSE  Pertinent labs & imaging results that were available during my care of the patient were reviewed by me and considered in my medical decision making (see chart for details).  Patient presents emergency department for chest discomfort since this morning.  Differential would include ACS, chest wall pain, pneumonia, pleurisy, pneumothorax.  Overall the patient appears well, no acute distress.  Patient is quite hypertensive states he is already taken his nighttime medications.  We will check labs including cardiac enzymes, chest x-ray, EKG.  We will dose nitroglycerin for pain control and continue to closely monitor.  Patient agreeable to plan of care.  Wife is here with the patient states over the past 2 days patient has been progressively more more weak, slumping over at dinner tonight.  Patient complaining of chest pain.  Troponin negative.  White blood cell count of 12.0 with mild tachypnea.  Chest x-ray shows bilateral infiltrates left greater than right.  We will cover with community-acquired antibiotics.  We will also send a flu swab.  Patient will be admitted to the hospital service.  CRITICAL CARE Performed by: Harvest Dark   Total critical care time: 30 minutes  Critical care time was exclusive of separately billable procedures and treating other patients.  Critical care was necessary to treat or prevent imminent or life-threatening deterioration.  Critical care was time spent personally by me on the following activities: development of treatment plan with  patient and/or surrogate as well as nursing, discussions with consultants, evaluation of patient's response to treatment, examination of patient, obtaining history from patient or surrogate, ordering and performing treatments and interventions, ordering and review of laboratory studies, ordering and review of radiographic studies, pulse oximetry and re-evaluation of patient's condition.   ____________________________________________   FINAL CLINICAL IMPRESSION(S) / ED DIAGNOSES  Chest pain Pneumonia   Harvest Dark, MD 04/13/18 2505    Harvest Dark, MD 04/23/18 [phone]

## 2018-04-13 NOTE — ED Triage Notes (Addendum)
Pt arrived via [facility] with CP. Pt family informed EMS that pt had been getting weaker over the past few days. Pt slid out of chair at home and family had to assist him back into the chair. Hx of subarchnoid bleed, DM, HTN, and TIA. Pt received 324 mg of ASA and 1 nitro on route.

## 2018-04-14 ENCOUNTER — Encounter: Payer: Self-pay | Admitting: Internal Medicine

## 2018-04-14 ENCOUNTER — Inpatient Hospital Stay: Payer: Medicare Other

## 2018-04-14 ENCOUNTER — Inpatient Hospital Stay
Admit: 2018-04-14 | Discharge: 2018-04-14 | Disposition: A | Discharge status: Home / Self Care | Payer: Medicare Other | Attending: Internal Medicine | Admitting: Internal Medicine

## 2018-04-14 DIAGNOSIS — I129 Hypertensive chronic kidney disease with stage 1 through stage 4 chronic kidney disease, or unspecified chronic kidney disease: Secondary | ICD-10-CM | POA: Diagnosis present

## 2018-04-14 DIAGNOSIS — R071 Chest pain on breathing: Secondary | ICD-10-CM | POA: Diagnosis present

## 2018-04-14 DIAGNOSIS — K5900 Constipation, unspecified: Secondary | ICD-10-CM | POA: Diagnosis present

## 2018-04-14 DIAGNOSIS — Z7902 Long term (current) use of antithrombotics/antiplatelets: Secondary | ICD-10-CM | POA: Diagnosis not present

## 2018-04-14 DIAGNOSIS — F05 Delirium due to known physiological condition: Secondary | ICD-10-CM | POA: Diagnosis not present

## 2018-04-14 DIAGNOSIS — R079 Chest pain, unspecified: Secondary | ICD-10-CM | POA: Diagnosis present

## 2018-04-14 DIAGNOSIS — E1165 Type 2 diabetes mellitus with hyperglycemia: Secondary | ICD-10-CM | POA: Diagnosis present

## 2018-04-14 DIAGNOSIS — I248 Other forms of acute ischemic heart disease: Secondary | ICD-10-CM | POA: Diagnosis present

## 2018-04-14 DIAGNOSIS — Z87442 Personal history of urinary calculi: Secondary | ICD-10-CM | POA: Diagnosis not present

## 2018-04-14 DIAGNOSIS — Z823 Family history of stroke: Secondary | ICD-10-CM | POA: Diagnosis not present

## 2018-04-14 DIAGNOSIS — Z833 Family history of diabetes mellitus: Secondary | ICD-10-CM | POA: Diagnosis not present

## 2018-04-14 DIAGNOSIS — I2510 Atherosclerotic heart disease of native coronary artery without angina pectoris: Secondary | ICD-10-CM | POA: Diagnosis present

## 2018-04-14 DIAGNOSIS — R443 Hallucinations, unspecified: Secondary | ICD-10-CM | POA: Diagnosis not present

## 2018-04-14 DIAGNOSIS — F17210 Nicotine dependence, cigarettes, uncomplicated: Secondary | ICD-10-CM | POA: Diagnosis present

## 2018-04-14 DIAGNOSIS — N183 Chronic kidney disease, stage 3 (moderate): Secondary | ICD-10-CM | POA: Diagnosis present

## 2018-04-14 DIAGNOSIS — Z8673 Personal history of transient ischemic attack (TIA), and cerebral infarction without residual deficits: Secondary | ICD-10-CM | POA: Diagnosis not present

## 2018-04-14 DIAGNOSIS — J189 Pneumonia, unspecified organism: Secondary | ICD-10-CM | POA: Diagnosis present

## 2018-04-14 DIAGNOSIS — A419 Sepsis, unspecified organism: Secondary | ICD-10-CM | POA: Diagnosis present

## 2018-04-14 DIAGNOSIS — J9601 Acute respiratory failure with hypoxia: Secondary | ICD-10-CM | POA: Diagnosis present

## 2018-04-14 DIAGNOSIS — N179 Acute kidney failure, unspecified: Secondary | ICD-10-CM | POA: Diagnosis present

## 2018-04-14 DIAGNOSIS — Z85828 Personal history of other malignant neoplasm of skin: Secondary | ICD-10-CM | POA: Diagnosis not present

## 2018-04-14 DIAGNOSIS — E785 Hyperlipidemia, unspecified: Secondary | ICD-10-CM | POA: Diagnosis present

## 2018-04-14 DIAGNOSIS — E1151 Type 2 diabetes mellitus with diabetic peripheral angiopathy without gangrene: Secondary | ICD-10-CM | POA: Diagnosis present

## 2018-04-14 DIAGNOSIS — E1122 Type 2 diabetes mellitus with diabetic chronic kidney disease: Secondary | ICD-10-CM | POA: Diagnosis present

## 2018-04-14 DIAGNOSIS — Z8249 Family history of ischemic heart disease and other diseases of the circulatory system: Secondary | ICD-10-CM | POA: Diagnosis not present

## 2018-04-14 DIAGNOSIS — R6510 Systemic inflammatory response syndrome (SIRS) of non-infectious origin without acute organ dysfunction: Secondary | ICD-10-CM | POA: Diagnosis present

## 2018-04-14 LAB — PHOSPHORUS: Phosphorus: 3.7 mg/dL (ref 2.5–4.6)

## 2018-04-14 LAB — LACTIC ACID, PLASMA
Lactic Acid, Venous: 1.7 mmol/L (ref 0.5–1.9)
Lactic Acid, Venous: 2.7 mmol/L (ref 0.5–1.9)

## 2018-04-14 LAB — GLUCOSE, CAPILLARY
GLUCOSE-CAPILLARY: 247 mg/dL — AB (ref 70–99)
Glucose-Capillary: 53 mg/dL — ABNORMAL LOW (ref 70–99)
Glucose-Capillary: 94 mg/dL (ref 70–99)
Glucose-Capillary: 94 mg/dL (ref 70–99)
Glucose-Capillary: 138 mg/dL — ABNORMAL HIGH (ref 70–99)

## 2018-04-14 LAB — INFLUENZA PANEL BY PCR (TYPE A & B)
INFLBPCR: NEGATIVE
Influenza A By PCR: NEGATIVE

## 2018-04-14 LAB — PROCALCITONIN: Procalcitonin: <0.1 ng/mL

## 2018-04-14 LAB — TROPONIN I
TROPONIN I: 0.03 ng/mL — AB (ref <0.03)
Troponin I: 0.03 ng/mL (ref <0.03)

## 2018-04-14 LAB — MAGNESIUM: Magnesium: 1.5 mg/dL — ABNORMAL LOW (ref 1.7–2.4)

## 2018-04-14 MED ORDER — ESCITALOPRAM OXALATE 10 MG PO TABS
20.0000 mg | ORAL_TABLET | Freq: Every day | ORAL | Status: DC
  Administered 2018-04-14 – 2018-04-19 (×6): 20 mg via ORAL
  Filled 2018-04-14 (×6): qty 2

## 2018-04-14 MED ORDER — HYDROCHLOROTHIAZIDE 12.5 MG PO CAPS
12.5000 mg | ORAL_CAPSULE | Freq: Every day | ORAL | Status: DC
  Administered 2018-04-14: 12.5 mg via ORAL
  Filled 2018-04-14: qty 1

## 2018-04-14 MED ORDER — TAMSULOSIN HCL 0.4 MG PO CAPS
0.4000 mg | ORAL_CAPSULE | Freq: Every day | ORAL | Status: DC
  Administered 2018-04-14 – 2018-04-19 (×7): 0.4 mg via ORAL
  Filled 2018-04-14 (×6): qty 1

## 2018-04-14 MED ORDER — GABAPENTIN 300 MG PO CAPS
300.0000 mg | ORAL_CAPSULE | Freq: Every day | ORAL | Status: DC
  Administered 2018-04-14 – 2018-04-17 (×4): 300 mg via ORAL
  Filled 2018-04-14 (×4): qty 1

## 2018-04-14 MED ORDER — PANTOPRAZOLE SODIUM 40 MG PO TBEC
40.0000 mg | DELAYED_RELEASE_TABLET | Freq: Every day | ORAL | Status: DC
  Administered 2018-04-14 – 2018-04-19 (×6): 40 mg via ORAL
  Filled 2018-04-14 (×6): qty 1

## 2018-04-14 MED ORDER — AMLODIPINE BESYLATE 10 MG PO TABS
10.0000 mg | ORAL_TABLET | Freq: Every day | ORAL | Status: DC
  Administered 2018-04-14 – 2018-04-19 (×6): 10 mg via ORAL
  Filled 2018-04-14 (×6): qty 1

## 2018-04-14 MED ORDER — ALUM & MAG HYDROXIDE-SIMETH 200-200-20 MG/5ML PO SUSP
30.0000 mL | Freq: Once | ORAL | Status: AC
  Administered 2018-04-14: 30 mL via ORAL
  Filled 2018-04-14: qty 30

## 2018-04-14 MED ORDER — ENOXAPARIN SODIUM 40 MG/0.4ML ~~LOC~~ SOLN
40.0000 mg | SUBCUTANEOUS | Status: DC
  Administered 2018-04-14 – 2018-04-19 (×6): 40 mg via SUBCUTANEOUS
  Filled 2018-04-14 (×6): qty 0.4

## 2018-04-14 MED ORDER — FAMOTIDINE 20 MG PO TABS
20.0000 mg | ORAL_TABLET | Freq: Every day | ORAL | Status: DC
  Administered 2018-04-14 – 2018-04-19 (×6): 20 mg via ORAL
  Filled 2018-04-14 (×6): qty 1

## 2018-04-14 MED ORDER — MAGNESIUM SULFATE 2 GM/50ML IV SOLN
2.0000 g | Freq: Once | INTRAVENOUS | Status: AC
  Administered 2018-04-14: 2 g via INTRAVENOUS
  Filled 2018-04-14: qty 50

## 2018-04-14 MED ORDER — HYDROCHLOROTHIAZIDE 25 MG PO TABS
25.0000 mg | ORAL_TABLET | Freq: Every day | ORAL | Status: DC
  Administered 2018-04-14 – 2018-04-19 (×6): 25 mg via ORAL
  Filled 2018-04-14 (×6): qty 1

## 2018-04-14 MED ORDER — LIDOCAINE VISCOUS HCL 2 % MT SOLN
15.0000 mL | Freq: Once | OROMUCOSAL | Status: AC
  Administered 2018-04-14: 15 mL via ORAL
  Filled 2018-04-14 (×4): qty 15

## 2018-04-14 MED ORDER — SENNOSIDES-DOCUSATE SODIUM 8.6-50 MG PO TABS: 1.0000 | ORAL_TABLET | Freq: Every evening | ORAL | Status: DC | PRN

## 2018-04-14 MED ORDER — VITAMIN B-12 1000 MCG PO TABS
1000.0000 ug | ORAL_TABLET | Freq: Every day | ORAL | Status: DC
  Administered 2018-04-14 – 2018-04-19 (×6): 1000 ug via ORAL
  Filled 2018-04-14 (×6): qty 1

## 2018-04-14 MED ORDER — ACETAMINOPHEN 325 MG PO TABS
650.0000 mg | ORAL_TABLET | Freq: Four times a day (QID) | ORAL | Status: DC | PRN
  Administered 2018-04-14 – 2018-04-19 (×3): 650 mg via ORAL
  Filled 2018-04-14 (×3): qty 2

## 2018-04-14 MED ORDER — IPRATROPIUM-ALBUTEROL 0.5-2.5 (3) MG/3ML IN SOLN: 3.0000 mL | Freq: Once | RESPIRATORY_TRACT | Status: DC

## 2018-04-14 MED ORDER — PRIMIDONE 50 MG PO TABS
50.0000 mg | ORAL_TABLET | Freq: Two times a day (BID) | ORAL | Status: DC
  Administered 2018-04-14 – 2018-04-19 (×11): 50 mg via ORAL
  Filled 2018-04-14 (×12): qty 1

## 2018-04-14 MED ORDER — HYOSCYAMINE SULFATE ER 0.375 MG PO TB12
0.3750 mg | ORAL_TABLET | Freq: Two times a day (BID) | ORAL | Status: DC
  Administered 2018-04-14 – 2018-04-19 (×12): 0.375 mg via ORAL
  Filled 2018-04-14 (×13): qty 1

## 2018-04-14 MED ORDER — HYDRALAZINE HCL 20 MG/ML IJ SOLN
5.0000 mg | INTRAMUSCULAR | Status: DC | PRN
  Administered 2018-04-14 – 2018-04-16 (×3): 5 mg via INTRAVENOUS
  Filled 2018-04-14 (×3): qty 1

## 2018-04-14 MED ORDER — INSULIN GLARGINE 100 UNIT/ML ~~LOC~~ SOLN
15.0000 [IU] | Freq: Every day | SUBCUTANEOUS | Status: DC
  Administered 2018-04-14 – 2018-04-17 (×4): 15 [IU] via SUBCUTANEOUS
  Filled 2018-04-14 (×5): qty 0.15

## 2018-04-14 MED ORDER — ONDANSETRON HCL 4 MG/2ML IJ SOLN
4.0000 mg | Freq: Four times a day (QID) | INTRAMUSCULAR | Status: DC | PRN
  Administered 2018-04-16: 4 mg via INTRAVENOUS
  Filled 2018-04-14: qty 2

## 2018-04-14 MED ORDER — INSULIN ASPART 100 UNIT/ML ~~LOC~~ SOLN
0.0000 [IU] | Freq: Three times a day (TID) | SUBCUTANEOUS | Status: DC
  Administered 2018-04-14: 5 [IU] via SUBCUTANEOUS
  Administered 2018-04-15: 8 [IU] via SUBCUTANEOUS
  Administered 2018-04-15: 2 [IU] via SUBCUTANEOUS
  Administered 2018-04-16 – 2018-04-17 (×2): 5 [IU] via SUBCUTANEOUS
  Administered 2018-04-17: 3 [IU] via SUBCUTANEOUS
  Administered 2018-04-18: 2 [IU] via SUBCUTANEOUS
  Administered 2018-04-19: 5 [IU] via SUBCUTANEOUS
  Administered 2018-04-19: 2 [IU] via SUBCUTANEOUS
  Filled 2018-04-14 (×9): qty 1

## 2018-04-14 MED ORDER — PROPRANOLOL HCL 10 MG PO TABS
20.0000 mg | ORAL_TABLET | Freq: Two times a day (BID) | ORAL | Status: DC
  Administered 2018-04-14 – 2018-04-19 (×9): 20 mg via ORAL
  Filled 2018-04-14 (×4): qty 1
  Filled 2018-04-14: qty 2
  Filled 2018-04-14: qty 1
  Filled 2018-04-14: qty 2
  Filled 2018-04-14: qty 1
  Filled 2018-04-14 (×4): qty 2
  Filled 2018-04-14 (×2): qty 1
  Filled 2018-04-14: qty 2

## 2018-04-14 MED ORDER — ATORVASTATIN CALCIUM 10 MG PO TABS
5.0000 mg | ORAL_TABLET | Freq: Every day | ORAL | Status: DC
  Administered 2018-04-14 – 2018-04-19 (×6): 5 mg via ORAL
  Filled 2018-04-14 (×6): qty 1

## 2018-04-14 MED ORDER — INSULIN ASPART 100 UNIT/ML ~~LOC~~ SOLN: 0.0000 [IU] | Freq: Every day | SUBCUTANEOUS | Status: DC

## 2018-04-14 MED ORDER — LOSARTAN POTASSIUM 50 MG PO TABS
100.0000 mg | ORAL_TABLET | Freq: Every day | ORAL | Status: DC
  Administered 2018-04-14 – 2018-04-19 (×6): 100 mg via ORAL
  Filled 2018-04-14 (×6): qty 2

## 2018-04-14 MED ORDER — LACTATED RINGERS IV SOLN: INTRAVENOUS | Status: AC

## 2018-04-14 MED ORDER — IPRATROPIUM-ALBUTEROL 0.5-2.5 (3) MG/3ML IN SOLN
3.0000 mL | Freq: Four times a day (QID) | RESPIRATORY_TRACT | Status: DC
  Administered 2018-04-15: 3 mL via RESPIRATORY_TRACT
  Filled 2018-04-14 (×2): qty 3

## 2018-04-14 MED ORDER — METOPROLOL TARTRATE 5 MG/5ML IV SOLN
5.0000 mg | Freq: Four times a day (QID) | INTRAVENOUS | Status: DC | PRN
  Administered 2018-04-14: 5 mg via INTRAVENOUS
  Filled 2018-04-14: qty 5

## 2018-04-14 MED ORDER — CLOPIDOGREL BISULFATE 75 MG PO TABS
75.0000 mg | ORAL_TABLET | Freq: Every day | ORAL | Status: DC
  Administered 2018-04-14 – 2018-04-19 (×6): 75 mg via ORAL
  Filled 2018-04-14 (×6): qty 1

## 2018-04-14 MED ORDER — ISOSORBIDE MONONITRATE ER 60 MG PO TB24
60.0000 mg | ORAL_TABLET | Freq: Two times a day (BID) | ORAL | Status: DC
  Administered 2018-04-14 – 2018-04-19 (×11): 60 mg via ORAL
  Filled 2018-04-14 (×11): qty 1

## 2018-04-14 MED ORDER — BISACODYL 5 MG PO TBEC
5.0000 mg | DELAYED_RELEASE_TABLET | Freq: Every day | ORAL | Status: DC | PRN
  Administered 2018-04-15 – 2018-04-16 (×2): 5 mg via ORAL
  Filled 2018-04-14 (×2): qty 1

## 2018-04-14 MED ORDER — ONDANSETRON HCL 4 MG PO TABS: 4.0000 mg | ORAL_TABLET | Freq: Four times a day (QID) | ORAL | Status: DC | PRN

## 2018-04-14 MED ORDER — ACETAMINOPHEN 650 MG RE SUPP: 650.0000 mg | Freq: Four times a day (QID) | RECTAL | Status: DC | PRN

## 2018-04-14 NOTE — H&P (Signed)
[place] [facility] [place] NAME: Richard Chapman    MR#:  [id number]  DATE OF BIRTH:  31-Mar-1940  DATE OF ADMISSION:  04/13/2018  PRIMARY CARE PHYSICIAN: Leonel Ramsay, MD   REQUESTING/REFERRING PHYSICIAN: Paulette Blanch, MD; Harvest Dark, MD  CHIEF COMPLAINT:   Chief Complaint  Patient presents with  . Chest Pain    HISTORY OF PRESENT ILLNESS:  Richard Chapman  is a 78 y.o. male with a known history of T2IDDM, HTN, HLD, CAD, PVD, CVA, CKD III p/w CP, pneumonia, sepsis. AAOx3 but hard of hearing. Also a poor historian. Hx obtained from pt and wife at bedside. Last saw Dr. Nehemiah Massed (Cardiology) 02/20/2018. 12/16/2016 Echo w/ EF 05%, grade I diastolic dysfxn. States that he started to "feel bad" on Sat (02/29) morning. Endorses mid-chest pain, initially mild, sharp, non-exertional, non-positional, non-reproducible, non-radiating, (+) pleuritic (worse w/ cough/deep inspiration). He states the discomfort became progressively worse. He took 2x ASA81 and 1x NTG, w/ improvement in pain. EMS called. Pt's wife states she thinks pt has been getting progressively weaker since Mon 02/24. She tells me he had an outpt appointment for a skin graft during the week, and had a difficult time going. SIRS (+), appears toxic/diaphoretic. Denies cough, SOB, fever, chills, night sweats, rigors. CXR (+), "Bibasilar infiltrates, left greater than right." SpO2 stable, (-) distress.  PAST MEDICAL HISTORY:   Past Medical History:  Diagnosis Date  . Anxiety and depression   . Depression   . Diabetes ([place])   . Elevated PSA   . GERD (gastroesophageal reflux disease)   . History of nephrolithiasis   . Hypertension   . Skin cancer   . Sleep apnea     PAST SURGICAL HISTORY:   Past Surgical History:  Procedure Laterality Date  . APPENDECTOMY    . BACK SURGERY    . BURR HOLE FOR SUBDURAL HEMATOMA     2015  . KNEE SURGERY Bilateral   . NECK SURGERY    . SHOULDER  SURGERY    . TONSILLECTOMY    . WRIST SURGERY      SOCIAL HISTORY:   Social History   Tobacco Use  . Smoking status: Current Every Day Smoker    Packs/day: 1.00    Types: Cigarettes  . Smokeless tobacco: Never Used  Substance Use Topics  . Alcohol use: Yes    Alcohol/week: 2.0 standard drinks    Types: 2 Standard drinks or equivalent per week    FAMILY HISTORY:   Family History  Problem Relation Age of Onset  . Heart disease Mother   . Heart disease Father   . Diabetes Father   . Stroke Father   . Kidney disease Neg Hx   . Prostate cancer Neg Hx     DRUG ALLERGIES:   Allergies  Allergen Reactions  . Lisinopril Other (See Comments)    REVIEW OF SYSTEMS:   Review of Systems  Constitutional: Positive for diaphoresis and malaise/fatigue. Negative for chills, fever and weight loss.  HENT: Positive for hearing loss. Negative for congestion, ear pain, nosebleeds, sinus pain, sore throat and tinnitus.   Eyes: Negative for blurred vision, double vision and photophobia.  Respiratory: Negative for cough, hemoptysis, sputum production, shortness of breath and wheezing.   Cardiovascular: Positive for chest pain. Negative for palpitations, orthopnea, claudication, leg swelling and PND.  Gastrointestinal: Negative for abdominal pain, blood in stool, constipation, diarrhea, heartburn, melena, nausea and vomiting.  Genitourinary: Negative for  dysuria, frequency, hematuria and urgency.  Musculoskeletal: Negative for back pain, joint pain, myalgias and neck pain.  Skin: Negative for itching and rash.  Neurological: Positive for weakness. Negative for dizziness, tingling, tremors, sensory change, speech change, focal weakness, seizures, loss of consciousness and headaches.  Psychiatric/Behavioral: Negative for depression and memory loss. The patient is not nervous/anxious and does not have insomnia.    MEDICATIONS AT HOME:   Prior to Admission medications   Medication Sig Start  Date End Date Taking? Authorizing Provider  acetaminophen (TYLENOL) 325 MG tablet Take 2 tablets (650 mg total) every 6 (six) hours as needed by mouth for mild pain (or Fever >/= 101). 12/18/16  Yes Gouru, Illene Silver, MD  amLODipine (NORVASC) 10 MG tablet Take 1 tablet (10 mg total) by mouth daily. 04/07/15  Yes Fritzi Mandes, MD  atorvastatin (LIPITOR) 10 MG tablet Take 5 mg by mouth daily.   Yes [provider]  cephALEXin (KEFLEX) 500 MG capsule Take 500 mg by mouth 2 (two) times daily.   Yes [provider]  cetirizine (ZYRTEC) 10 MG tablet Take 10 mg by mouth daily.   Yes [provider]  cholecalciferol (VITAMIN D) 400 units TABS tablet Take 4,000 Units by mouth daily.    Yes [provider]  clopidogrel (PLAVIX) 75 MG tablet Take 1 tablet (75 mg total) by mouth daily. 04/07/15  Yes Fritzi Mandes, MD  docusate sodium (COLACE) 100 MG capsule Take 1 capsule (100 mg total) by mouth 2 (two) times daily as needed for mild constipation. 11/26/14  Yes Gouru, Aruna, MD  escitalopram (LEXAPRO) 20 MG tablet Take 1 tablet (20 mg total) by mouth daily. Patient taking differently: Take 20 mg by mouth 2 (two) times daily.  11/26/14  Yes Gouru, Illene Silver, MD  esomeprazole (NEXIUM) 20 MG capsule Take 1 capsule (20 mg total) by mouth AC breakfast. 09/24/15  Yes Fritzi Mandes, MD  gabapentin (NEURONTIN) 300 MG capsule Take 300 mg by mouth at bedtime.    Yes [provider]  hydrochlorothiazide (MICROZIDE) 12.5 MG capsule Take 12.5 mg by mouth daily.   Yes [provider]  hyoscyamine (LEVBID) 0.375 MG 12 hr tablet Take 0.375 mg by mouth every 12 (twelve) hours.   Yes [provider]  insulin degludec (TRESIBA FLEXTOUCH) 100 UNIT/ML SOPN FlexTouch Pen Inject 28-30 Units into the skin daily at 10 pm. 28 units on the weekdays and 30 units on the weekends   Yes [provider]  isosorbide mononitrate (IMDUR) 60 MG 24 hr tablet Take 1 tablet (60 mg total) by mouth  2 (two) times daily. Patient taking differently: Take 60 mg by mouth daily.  11/26/14  Yes Gouru, Illene Silver, MD  losartan (COZAAR) 100 MG tablet Take 100 mg by mouth daily.    Yes [provider]  magnesium oxide (MAG-OX) 400 MG tablet Take 400 mg by mouth daily.    Yes [provider]  metFORMIN (GLUCOPHAGE) 1000 MG tablet Take 1 tablet (1,000 mg total) by mouth daily with breakfast. Patient taking differently: Take 500 mg by mouth 2 (two) times daily with a meal.  11/28/14  Yes Gouru, Aruna, MD  nitroGLYCERIN (NITROSTAT) 0.4 MG SL tablet Place 0.4 mg under the tongue every 5 (five) minutes as needed for chest pain.    Yes [provider]  primidone (MYSOLINE) 50 MG tablet Take 1 tablet by mouth 2 (two) times daily. 11/04/14  Yes [provider]  propranolol (INDERAL) 20 MG tablet Take 20 mg  by mouth 2 (two) times daily.    Yes [provider]  ranitidine (ZANTAC) 150 MG capsule Take 150 mg by mouth 2 (two) times daily.   Yes [provider]  tamsulosin (FLOMAX) 0.4 MG CAPS capsule Take 0.4 mg by mouth daily.    Yes [provider]  vitamin B-12 (CYANOCOBALAMIN) 1000 MCG tablet Take 1,000 mcg by mouth daily.    Yes [provider]      VITAL SIGNS:  Blood pressure (!) 175/84, pulse 77, temperature 98.3 F (36.8 C), temperature source Oral, resp. rate 18, height 5\' 8"  (1.727 m), weight 78.9 kg, SpO2 97 %.  PHYSICAL EXAMINATION:  Physical Exam Constitutional:      General: He is not in acute distress.    Appearance: He is ill-appearing, toxic-appearing and diaphoretic.  HENT:     Head: Atraumatic.     Right Ear: Decreased hearing noted.     Left Ear: Decreased hearing noted.     Mouth/Throat:     Pharynx: Oropharynx is clear.  Eyes:     General: No scleral icterus.    Extraocular Movements: Extraocular movements intact.     Conjunctiva/sclera: Conjunctivae normal.  Neck:     Musculoskeletal: Neck supple.   Cardiovascular:     Rate and Rhythm: Normal rate and regular rhythm.     Heart sounds: Normal heart sounds. No murmur. No friction rub. No gallop.   Pulmonary:     Effort: No respiratory distress.     Breath sounds: No stridor. No wheezing, rhonchi or rales.  Abdominal:     General: Bowel sounds are normal. There is no distension.     Palpations: Abdomen is soft.     Tenderness: There is no abdominal tenderness. There is no guarding or rebound.  Musculoskeletal: Normal range of motion.        General: No swelling.     Right lower leg: No edema.     Left lower leg: No edema.  Lymphadenopathy:     Cervical: No cervical adenopathy.  Skin:    General: Skin is warm.     Findings: No erythema or rash.  Neurological:     Mental Status: He is alert and oriented to person, place, and time. Mental status is at baseline.  Psychiatric:        Mood and Affect: Mood normal.        Behavior: Behavior normal.        Thought Content: Thought content normal.        Judgment: Judgment normal.    Appears toxic/diaphoretic. I did not appreciate adventitious breath sounds on lung exam. Bandages on L hand + L thigh (skin graft). LABORATORY PANEL:   CBC Recent Labs  Lab 04/13/18 2158  WBC 12.0*  HGB 13.8  HCT 40.5  PLT 191   ------------------------------------------------------------------------------------------------------------------  Chemistries  Recent Labs  Lab 04/13/18 2158  NA 136  K 4.1  CL 101  CO2 25  GLUCOSE 142*  BUN 28*  CREATININE 1.53*  CALCIUM 9.3  MG 1.5*  AST 17  ALT 15  ALKPHOS 62  BILITOT 1.0   ------------------------------------------------------------------------------------------------------------------  Cardiac Enzymes Recent Labs  Lab 04/13/18 2158  TROPONINI <0.03   ------------------------------------------------------------------------------------------------------------------  RADIOLOGY:  Dg Chest 2 View  Result Date:  04/13/2018 CLINICAL DATA:  78 y/o male Pt arrived via [facility] with middle CP. Pt also reports dry cough x 1 month pt family informed EMS that pt had been getting weaker over the past few  days. Pt slid out of chair at home and family had to assist him back into the chair. History of smoking, hypertension, and diabetes. EXAM: CHEST - 2 VIEW COMPARISON:  12/14/2016 FINDINGS: Heart size is normal. There is patchy infiltrate at the lung bases, LEFT greater than RIGHT and likely infectious. No pulmonary edema. Remote cervical fusion. Degenerative changes are seen in thoracic spine. IMPRESSION: Bibasilar infiltrates, LEFT greater than RIGHT. Electronically Signed   By: Nolon Nations M.D.   On: 04/13/2018 22:43   IMPRESSION AND PLAN:   A/P: 58M w/ PMHx T2IDDM, HTN, HLD, CAD, PVD, CVA, CKD III p/w CP, pneumonia, sepsis. Recent skin graft (L thigh to L hand). Hyperglycemia (w/ T2IDDM), Cr elevation, hypomagnesemia, leukocytosis. -CP, SIRS, pneumonia, sepsis, leukocytosis: CP, mixed character, pleuritic, improved w/ ASA/NTG. Numerous cardiac risk factors. Trop-I (-), EKG (-) STE. ASA, Morphine, NTG, O2, cardiac monitoring, pulse ox, Echo. CXR (+), "Bibasilar infiltrates, left greater than right." WBC 12.0, tachypnea, SIRS (+), (+) sepsis. Lactate 2.7. Flu (-). BCx, sputum Cx/gram, UStrep + ULeg Ag pending. PCT < 0.10. Ceftriaxone, Azithromycin. Nebs for mobilization of secretions. -Hyperglycemia, T2IDDM: SSI, Lantus. Hold Metformin. -Cr elevation, AKI, CKD III: Cr 1.53 on present admission. Baseline appears to be 0.9-1.1. (+) AKI (likely prerenal) superimposed on CKD III (2/2 DM, HTN, aged kidney). IVF, monitor BMP, avoid nephrotoxins. -Hypomagnesemia: Replete, monitor. -Skin grafts: Wound consult. Wife states she brought the instructions for dressing changes. -c/w other home meds/formulary subs as tolerated. -FEN/GI: Cardiac diabetic diet as tolerated. -DVT PPx: Lovenox. -Code status: Full code. -Disposition:  Admission, > 2 midnights.   All the records are reviewed and case discussed with ED provider. Management plans discussed with the patient, family and they are in agreement.  CODE STATUS: Full code.  TOTAL TIME TAKING CARE OF THIS PATIENT: 75 minutes.    Arta Silence M.D on 04/14/2018 at 2:48 AM  Between 7am to 6pm - Pager - [phone]  After 6pm go to [email or web address] - Proofreader  [facility]  Office  [phone]  CC: Primary care physician; Leonel Ramsay, MD   Note: This dictation was prepared with Dragon dictation along with smaller phrase technology. Any transcriptional errors that result from this process are unintentional.

## 2018-04-14 NOTE — ED Provider Notes (Signed)
-----------------------------------------   1:01 AM on 04/14/2018 -----------------------------------------  Patient was given 5 mg IV Lopressor and now blood pressure is 159/72.  Hospitalist Dr. Aliene Altes to evaluate in the emergency department for admission.   Paulette Blanch, MD 04/14/18 [phone]

## 2018-04-14 NOTE — Evaluation (Signed)
Physical Therapy Evaluation Patient Details Name: Richard Chapman MRN: [id number] DOB: Jun 03, 1940 Today's Date: 04/14/2018   History of Present Illness  Pt is 78 y.o male that presented to the ED due to weakness, chest pain, and falling out of his chair. EMS gave pt 1 Nitroglycerin and pt reported is minimally alleviated symptoms. Chest Xray indicated bilateral infiltrates L>R. Per RN no cardio consult at this time and chest pain likely pleuritic in origin. Pt receiving skin grafts on L hand in outpatient setting. PMHx of skin CA, HTN, DM, subarachnoid bleeding, and TIA.     Clinical Impression  Richard Chapman admitted for the above and presents with the following deficits listed below (see PT problem list). BP in supine 169/72, SPT min guard for sup>sit with HOB elevated, increased time, and increased effort. BP sitting at EOB 155/89. Mod A for first STS, BP 164/137, SPT instructed pt to sit. BP with sitting rest break 179/89, initially with second STS 146/59, and in standing following 1 min rest break 131/62. Pt denied any dizziness. SPT min A for stand pivot transfer to chair as pt demonstrated poor RW management and impulsively began to sit prior to being close enough to the chair. Unsteadiness noted with stand pivot transfer, no knee buckling noted. HR monitored throughout and responded appropriately to activity, at rest 59-68. SPT recommending SNF upon discharge to address muscle weakness, decreased activity tolerance, balance impairments and due to decreased caregiver support at home.      Follow Up Recommendations SNF    Equipment Recommendations  Other (comment)(TBD next venue )    Recommendations for Other Services       Precautions / Restrictions Precautions Precautions: Fall Precaution Comments: chest pain, BP Restrictions Weight Bearing Restrictions: No Other Position/Activity Restrictions: However, this SPT limited WB with UE as pt reports pain in R shoulder/ arm as well as pt with  swollen L third digit.      Mobility  Bed Mobility Overal bed mobility: Needs Assistance Bed Mobility: Supine to Sit     Supine to sit: Min guard     General bed mobility comments: Min guard for sup>sit pt requiring increased time and effort. BP in supine 169/72, sitting at EOB 155/89. BP following first STS 179/89. SPT instructed pt to limit WB through RUE. Pt demonstrated difficulty following cues with WB of RUE as pt utilized it for bed mobility.   Transfers Overall transfer level: Needs assistance Equipment used: Rolling walker (2 wheeled) Transfers: Sit to/from [facility] Sit to Stand: Mod assist Stand pivot transfers: Min assist       General transfer comment: Mod A for first STS, BP 164/137, SPT instructed pt to sit. BP with sitting rest break 179/89, initially with second STS 146/59, and in standing following 1 min rest break 131/62. Pt denied any dizziness. SPT min A for stand pivot transfer to chair as pt demonstrated poor RW management and impulsively began to sit prior to being close enough to the chair.   Ambulation/Gait             General Gait Details: unable to attempt this date due to fatigue and impulsivity.  Stairs            Wheelchair Mobility    Modified Rankin (Stroke Patients Only)       Balance Overall balance assessment: Needs assistance Sitting-balance support: No upper extremity supported;Feet supported Sitting balance-Leahy Scale: Fair Sitting balance - Comments: Pt able to demonstrate alternate reaching outside BOS  in sitting without LOB. If moderately challenged suspected pt would lose balance.    Standing balance support: Bilateral upper extremity supported;During functional activity Standing balance-Leahy Scale: Poor Standing balance comment: BUE support of RW required to maintain static and dynamic balance.                             Pertinent Vitals/Pain Pain Assessment: 0-10 Pain Score: 7   Pain Location: R shoulder  Pain Descriptors / Indicators: Discomfort;Guarding Pain Intervention(s): Limited activity within patient's tolerance;Monitored during session;Repositioned    Home Living Family/patient expects to be discharged to:: Private residence Living Arrangements: Spouse/significant other Available Help at Discharge: Family;Available 24 hours/day(wife 24/7, son intermittently) Type of Home: House Home Access: Stairs to enter Entrance Stairs-Rails: None Entrance Stairs-Number of Steps: 1 Home Layout: One level Home Equipment: Walker - 2 wheels;Walker - 4 wheels;Cane - quad;Shower seat;Hand held shower head;Wheelchair - manual      Prior Function Level of Independence: Needs assistance   Gait / Transfers Assistance Needed: PTA pt ambulating household distances with use of SPC, although he has RW, rollator and WC. Pt is no longer driving and reports 1 fall in the past 6 months.   ADL's / Homemaking Assistance Needed: Pt required assistance for all ADLs.         Hand Dominance   Dominant Hand: Right    Extremity/Trunk Assessment   Upper Extremity Assessment Upper Extremity Assessment: RUE deficits/detail;LUE deficits/detail RUE Deficits / Details: Strength grossly 3/5. Pt reports 7/10 pain in R shoulder/arm and formal testing deferred at this time. Pt able to flex shouler 130 degrees. SPT indicated pt to limit WB through RUE. Pt reports that R UE pain began after the fall, pts wife present for evaluation reports he has been having neck/shoulder pain prior to fall.  LUE Deficits / Details: Strength grossly 3/5. Pt L third digit swollen and wrapped with bandage due to skin graft, formal testing deferred, pt able to flex shoulder 130 degrees.     Lower Extremity Assessment Lower Extremity Assessment: RLE deficits/detail;LLE deficits/detail RLE Deficits / Details: Strength grossly 3/5, pt able to perform SLR through full motion. LLE Deficits / Details: Strength  grossly 3/5, pt able to perform SLR through full motion.    Cervical / Trunk Assessment Cervical / Trunk Assessment: Kyphotic  Communication   Communication: HOH  Cognition Arousal/Alertness: Awake/alert Behavior During Therapy: WFL for tasks assessed/performed;Impulsive Overall Cognitive Status: Impaired/Different from baseline Area of Impairment: Safety/judgement;Awareness;Problem solving;Following commands                       Following Commands: Follows one step commands with increased time Safety/Judgement: Decreased awareness of safety Awareness: Emergent Problem Solving: Slow processing;Decreased initiation;Requires verbal cues General Comments: Per pt wife present for evaluation pt seeming more confused abd requires increased time for answering questions as of ~2 weeks ago.      General Comments General comments (skin integrity, edema, etc.): HR monitored throughout and responded to activity appropriately as HR at rest 59 and up to 68 with mobility. Pt reports that he has been having 7/10 R shoulder/arm pain since he slid out of his chair. Wife present during evaluation and reports that he has been having neck/shoulder issues prior to fall.     Exercises General Exercises - Lower Extremity Ankle Circles/Pumps: AROM;Both;15 reps;Supine Other Exercises Other Exercises: alternating UE reaching in sitting x3 each arm   Assessment/Plan  PT Assessment Patient needs continued PT services  PT Problem List Decreased strength;Decreased range of motion;Decreased activity tolerance;Decreased balance;Decreased mobility;Decreased knowledge of use of DME;Decreased knowledge of precautions;Decreased safety awareness;Decreased cognition;Cardiopulmonary status limiting activity;Pain       PT Treatment Interventions DME instruction;Gait training;Functional mobility training;Stair training;Therapeutic activities;Therapeutic exercise;Neuromuscular re-education;Balance  training;Cognitive remediation;Patient/family education;Wheelchair mobility training    PT Goals (Current goals can be found in the Care Plan section)  Acute Rehab PT Goals Patient Stated Goal: to get stronger and go home  PT Goal Formulation: With patient Time For Goal Achievement: 04/28/18 Potential to Achieve Goals: Fair    Frequency Min 2X/week   Barriers to discharge Decreased caregiver support Pt lives with wife who is unable to provide amount of assist pt currently requires.    Co-evaluation               AM-PAC PT "6 Clicks" Mobility  Outcome Measure Help needed turning from your back to your side while in a flat bed without using bedrails?: A Lot Help needed moving from lying on your back to sitting on the side of a flat bed without using bedrails?: A Little Help needed moving to and from a bed to a chair (including a wheelchair)?: A Lot Help needed standing up from a chair using your arms (e.g., wheelchair or bedside chair)?: A Lot Help needed to walk in hospital room?: A Lot Help needed climbing 3-5 steps with a railing? : Total 6 Click Score: 12    End of Session Equipment Utilized During Treatment: Gait belt Activity Tolerance: Patient limited by fatigue Patient left: in chair;with call bell/phone within reach;with chair alarm set;with family/visitor present;Other (comment)(Echo tech present ) Nurse Communication: Mobility status;Other (comment)(+ortho, shoulder pain) PT Visit Diagnosis: Unsteadiness on feet (R26.81);Other abnormalities of gait and mobility (R26.89);Muscle weakness (generalized) (M62.81);Repeated falls (R29.6);History of falling (Z91.81);Pain Pain - Right/Left: Right Pain - part of body: Shoulder    Time: 2919-1660 PT Time Calculation (min) (ACUTE ONLY): 35 min   Charges:   PT Evaluation $PT Eval Low Complexity: 1 Low PT Treatments $Therapeutic Activity: 8-22 mins        Dorothy Spark, SPT  04/14/2018, 3:29 PM

## 2018-04-14 NOTE — Progress Notes (Signed)
*  PRELIMINARY RESULTS* Echocardiogram 2D Echocardiogram has been performed.  Richard Chapman 04/14/2018, 2:17 PM

## 2018-04-14 NOTE — ED Notes (Signed)
Received report from [place], [place]. Care assumed a this time

## 2018-04-14 NOTE — Progress Notes (Signed)
Patient admitted early this morning. Seen and examined by me later in the morning. States he is feeling fine. Having some central chest pain that feels like "tightness". No cough.   On exam, his lungs are CTAB. RRR. Abdomen soft and non-tender.  -BPs high- increased HCTZ to 25mg  daily and add IV hydralazine prn -Will stop antibiotics- repeat CXR this morning was negative, procalcitonin negative, and patient denies shortness of breath or cough. -Try GI cocktail to see if this helps his chest pain -Trend troponins -ECHO pending -May need cardiology consult  Hyman Bible, MD

## 2018-04-14 NOTE — ED Notes (Signed)
RN called to give report RN requested a few minutes to notify nurse.

## 2018-04-14 NOTE — ED Notes (Signed)
Provided perineal care, pt able to turn side to side, pt tolerated well, no other needs at this time.

## 2018-04-15 ENCOUNTER — Encounter: Payer: Self-pay | Admitting: Radiology

## 2018-04-15 ENCOUNTER — Inpatient Hospital Stay: Payer: Medicare Other

## 2018-04-15 LAB — CBC
HCT: 37.4 % — ABNORMAL LOW (ref 39.0–52.0)
Hemoglobin: 12.8 g/dL — ABNORMAL LOW (ref 13.0–17.0)
MCH: 30.8 pg (ref 26.0–34.0)
MCHC: 34.2 g/dL (ref 30.0–36.0)
MCV: 90.1 fL (ref 80.0–100.0)
Platelets: 177 10*3/uL (ref 150–400)
RBC: 4.15 MIL/uL — ABNORMAL LOW (ref 4.22–5.81)
RDW: 13.5 % (ref 11.5–15.5)
WBC: 9.8 10*3/uL (ref 4.0–10.5)
nRBC: 0 % (ref 0.0–0.2)

## 2018-04-15 LAB — BASIC METABOLIC PANEL
Anion gap: 7 (ref 5–15)
BUN: 28 mg/dL — ABNORMAL HIGH (ref 8–23)
CO2: 29 mmol/L (ref 22–32)
CREATININE: 1.67 mg/dL — AB (ref 0.61–1.24)
Calcium: 9 mg/dL (ref 8.9–10.3)
Chloride: 101 mmol/L (ref 98–111)
GFR calc Af Amer: 44 mL/min — ABNORMAL LOW (ref >60)
GFR, EST NON AFRICAN AMERICAN: 38 mL/min — AB (ref >60)
Glucose, Bld: 155 mg/dL — ABNORMAL HIGH (ref 70–99)
Potassium: 4.3 mmol/L (ref 3.5–5.1)
Sodium: 137 mmol/L (ref 135–145)

## 2018-04-15 LAB — GLUCOSE, CAPILLARY
Glucose-Capillary: 145 mg/dL — ABNORMAL HIGH (ref 70–99)
Glucose-Capillary: 264 mg/dL — ABNORMAL HIGH (ref 70–99)
Glucose-Capillary: 100 mg/dL — ABNORMAL HIGH (ref 70–99)
Glucose-Capillary: 145 mg/dL — ABNORMAL HIGH (ref 70–99)

## 2018-04-15 LAB — PROCALCITONIN: Procalcitonin: <0.1 ng/mL

## 2018-04-15 MED ORDER — WHITE PETROLATUM EX OINT
TOPICAL_OINTMENT | CUTANEOUS | Status: DC | PRN
  Filled 2018-04-15: qty 5

## 2018-04-15 MED ORDER — CEPHALEXIN 500 MG PO CAPS
500.0000 mg | ORAL_CAPSULE | Freq: Two times a day (BID) | ORAL | Status: DC
  Administered 2018-04-16 – 2018-04-19 (×7): 500 mg via ORAL
  Filled 2018-04-15 (×7): qty 1

## 2018-04-15 MED ORDER — IOHEXOL 350 MG/ML SOLN: 75.0000 mL | Freq: Once | INTRAVENOUS | Status: DC | PRN

## 2018-04-15 MED ORDER — IPRATROPIUM-ALBUTEROL 0.5-2.5 (3) MG/3ML IN SOLN
3.0000 mL | Freq: Two times a day (BID) | RESPIRATORY_TRACT | Status: DC
  Administered 2018-04-15 – 2018-04-19 (×8): 3 mL via RESPIRATORY_TRACT
  Filled 2018-04-15 (×8): qty 3

## 2018-04-15 NOTE — Progress Notes (Signed)
Pt having some mild episodes of possible delirium onset this evening, upon assessment by this RN patient is easily reoriented and only has reports of delirious behavior from wife. MD notified, previous head CT was negative, MD wants to continue to monitor at this time.   Bladder scan performed this evening yielding 79 ml of urine. Patient able to void 80 ml clear yellow urine prior to scan.

## 2018-04-15 NOTE — Progress Notes (Signed)
Pt ambulated with 2 assist to bathroom with 50 ml clear yellow urine output. Patient also found to have medium episode of urinary incontinence.

## 2018-04-15 NOTE — Consult Note (Signed)
[facility] Nurse wound consult note Patient receiving care in [facility].  Spouse present and provided printed instructions given for site care from the [facility] that did the procedure. Reason for Consult: Graft wound site care Wound type: Surgical Measurement: Deferred.  Currently dressings are intact, clean, without exterior drainage. Dressing procedure/placement/frequency: Wound care for the left hip skin graft donor site:  Clean the area with warm, soapy sterile water on Q tips.  Gently dry with Q tips.  Apply Vaseline over the wound bed using a Q tip.  Cover with Vaseline gauze. Cover with dry gauze. Tape in place. Change each shift. For the left middle finger graft site:  For the left middle finger skin graft site:  Clean the area with warm, soapy sterile water on Q tips.  Gently dry with Q tips.  Apply Vaseline over the wound bed using a Q tip.  Cover with Vaseline gauze. Cover with folded dry gauze. Tape in place. Change each shift. Monitor the wound area(s) for worsening of condition such as: Signs/symptoms of infection,  Increase in size,  Development of or worsening of odor, Development of pain, or increased pain at the affected locations.  Notify the medical team if any of these develop.  Thank you for the consult.  Discussed plan of care with the patient and bedside nurse.  [place] nurse will not follow at this time.  Please re-consult the [place] [facility] team if needed.  Val Riles, RN, MSN, CWOCN, CNS-BC, pager [phone]

## 2018-04-15 NOTE — Progress Notes (Signed)
Bladder scan performed yielding only 5mls urine at this time.

## 2018-04-15 NOTE — Progress Notes (Signed)
[place] at [place] NAME: Richard Chapman    MR#:  [id number]  DATE OF BIRTH:  1940-07-24  SUBJECTIVE:   Patient states he is feeling fine this morning.  He denies any shortness of breath, cough, fevers, chills.  Wife notes that he still is leaning to the left side.  No focal weakness, numbness, tingling.  REVIEW OF SYSTEMS:  Review of Systems  Constitutional: Negative for chills and fever.  HENT: Negative for congestion and sore throat.   Eyes: Negative for blurred vision and double vision.  Respiratory: Negative for cough and shortness of breath.   Cardiovascular: Negative for chest pain and palpitations.  Gastrointestinal: Negative for nausea and vomiting.  Genitourinary: Negative for dysuria and urgency.  Musculoskeletal: Negative for back pain and neck pain.  Neurological: Negative for dizziness and headaches.  Psychiatric/Behavioral: Negative for depression. The patient is not nervous/anxious.     DRUG ALLERGIES:   Allergies  Allergen Reactions  . Lisinopril Other (See Comments)   VITALS:  Blood pressure 135/60, pulse 70, temperature (!) 97.5 F (36.4 C), temperature source Oral, resp. rate 16, height 5\' 8"  (1.727 m), weight 76 kg, SpO2 98 %. PHYSICAL EXAMINATION:  Physical Exam  GENERAL:  Laying in the bed with no acute distress.  HEENT: Head atraumatic, normocephalic. Pupils equal, round, reactive to light and accommodation. No scleral icterus. Extraocular muscles intact. Oropharynx and nasopharynx clear.  NECK:  Supple, no jugular venous distention. No thyroid enlargement. LUNGS: Lungs are clear to auscultation bilaterally. No wheezes, crackles, rhonchi. No use of accessory muscles of respiration.  CARDIOVASCULAR: RRR, S1, S2 normal. No murmurs, rubs, or gallops.  ABDOMEN: Soft, nontender, nondistended. Bowel sounds present.  EXTREMITIES: No pedal edema, cyanosis, or clubbing.  NEUROLOGIC: CN 2-12 intact, no focal deficits.  5/5 muscle strength throughout all extremities. Sensation intact throughout. Gait not checked.  PSYCHIATRIC: The patient is alert and oriented x 3.  SKIN: No obvious rash, lesion, or ulcer. + Dry bandage in place over left hand. LABORATORY PANEL:  Male CBC Recent Labs  Lab 04/15/18 0347  WBC 9.8  HGB 12.8*  HCT 37.4*  PLT 177   ------------------------------------------------------------------------------------------------------------------ Chemistries  Recent Labs  Lab 04/13/18 2158 04/15/18 0347  NA 136 137  K 4.1 4.3  CL 101 101  CO2 25 29  GLUCOSE 142* 155*  BUN 28* 28*  CREATININE 1.53* 1.67*  CALCIUM 9.3 9.0  MG 1.5*  --   AST 17  --   ALT 15  --   ALKPHOS 62  --   BILITOT 1.0  --    RADIOLOGY:  Ct Head Wo Contrast  Result Date: 04/15/2018 CLINICAL DATA:  Hypertensive.  History of diabetes and hypertension. EXAM: CT HEAD WITHOUT CONTRAST TECHNIQUE: Contiguous axial images were obtained from the base of the skull through the vertex without intravenous contrast. COMPARISON:  CT HEAD April 05, 2015 FINDINGS: BRAIN: No intraparenchymal hemorrhage, mass effect nor midline shift. No parenchymal brain volume loss for age. No hydrocephalus. Confluent supratentorial white matter hypodensities. 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 Old RIGHT basal ganglia and bilateral thalamus lacunar infarcts; LEFT thalamus infarct is new. No acute large vascular territory infarcts. No abnormal extra-axial fluid collections. Basal cisterns are patent. VASCULAR: Mild calcific atherosclerosis of the carotid siphons. SKULL: No skull fracture. Multiple old burr holes. No significant scalp soft tissue swelling. SINUSES/ORBITS: Chronic LEFT mastoid effusion with air cell coalescence. RIGHT mastoid air cells well aerated.The included ocular globes and orbital contents  are non-suspicious. Status post bilateral ocular lens implants. OTHER: None. IMPRESSION: 1. No acute intracranial process. 2. 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 3. Severe chronic small vessel ischemic changes. Electronically Signed   By: Elon Alas M.D.   On: 04/15/2018 15:46   ASSESSMENT AND PLAN:   Acute hypoxic respiratory failure-resolved.  Initially felt to be secondary to community-acquired pneumonia, but repeat chest x-ray without any infiltrate and procalcitonin negative.  Patient without fevers, chills, shortness of breath, or cough. -Antibiotics discontinued yesterday -Duonebs as needed -Continue to monitor  Sepsis-patient meeting sepsis criteria on admission.  Sepsis ruled out with negative cultures.  Uncontrolled hypertension-blood pressures remain high -Continue Norvasc -Continue losartan and HCTZ.  Will need to hold these if kidney function worsens tomorrow.  Elevated troponins-likely secondary to demand ischemia.  No chest pain. -Echo pending  Generalized weakness-unclear etiology.  May be secondary to deconditioning. -PT recommending SNF -Obtain CT head  Type 2 diabetes -Continue Lantus and SSI  AKI on CKD 3-creatinine bumped from 1.53 > 1.67.  May be due to increasing HCTZ dose yesterday. -Encourage p.o. intake -Will hold losartan and HCTZ kidney function worsens tomorrow -Renal ultrasound ordered -Check UA  Recent removal of skin cancer from left hand-dry bandage in place -Will restart home Keflex, patient supposed to be taking this for 7 days  All the records are reviewed and case discussed with Care Management/Social Worker. Management plans discussed with the patient, family and they are in agreement.  CODE STATUS: Full Code  TOTAL TIME TAKING CARE OF THIS PATIENT: 45 minutes.   More than 50% of the time was spent in counseling/coordination of care: YES  POSSIBLE D/C IN 1-2 DAYS, DEPENDING ON CLINICAL CONDITION.   Berna Spare  M.D on 04/15/2018 at 8:58 PM  Between 7am to 6pm - Pager - [phone]  After 6pm go to [email or web address]  - Proofreader  [facility]  Office  [phone]  CC: Primary care physician; Leonel Ramsay, MD  Note: This dictation was prepared with Dragon dictation along with smaller phrase technology. Any transcriptional errors that result from this process are unintentional.

## 2018-04-15 NOTE — Progress Notes (Signed)
Pt c/o lower abdominal pain and difficulty urinating. Bladder scan showed >900 mL in the bladder. Notified MD [place] for order for I&O cath. I&O cath yielded 700 mL of clear, yellow urine. Pt stated he felt better post cath. Nursing staff will continue to monitor for any changes in patient status. Earleen Reaper, RN

## 2018-04-16 ENCOUNTER — Inpatient Hospital Stay: Payer: Medicare Other

## 2018-04-16 LAB — BASIC METABOLIC PANEL
Anion gap: 10 (ref 5–15)
BUN: 35 mg/dL — ABNORMAL HIGH (ref 8–23)
CO2: 25 mmol/L (ref 22–32)
Calcium: 9 mg/dL (ref 8.9–10.3)
Chloride: 98 mmol/L (ref 98–111)
Creatinine, Ser: 1.45 mg/dL — ABNORMAL HIGH (ref 0.61–1.24)
GFR calc Af Amer: 53 mL/min — ABNORMAL LOW (ref >60)
GFR, EST NON AFRICAN AMERICAN: 45 mL/min — AB (ref >60)
Glucose, Bld: 130 mg/dL — ABNORMAL HIGH (ref 70–99)
Potassium: 3.4 mmol/L — ABNORMAL LOW (ref 3.5–5.1)
Sodium: 133 mmol/L — ABNORMAL LOW (ref 135–145)

## 2018-04-16 LAB — GLUCOSE, CAPILLARY
GLUCOSE-CAPILLARY: 120 mg/dL — AB (ref 70–99)
Glucose-Capillary: 239 mg/dL — ABNORMAL HIGH (ref 70–99)
Glucose-Capillary: 114 mg/dL — ABNORMAL HIGH (ref 70–99)
Glucose-Capillary: 140 mg/dL — ABNORMAL HIGH (ref 70–99)

## 2018-04-16 LAB — CBC
HCT: 35.2 % — ABNORMAL LOW (ref 39.0–52.0)
Hemoglobin: 12.2 g/dL — ABNORMAL LOW (ref 13.0–17.0)
MCH: 31.3 pg (ref 26.0–34.0)
MCHC: 34.7 g/dL (ref 30.0–36.0)
MCV: 90.3 fL (ref 80.0–100.0)
Platelets: 176 10*3/uL (ref 150–400)
RBC: 3.9 MIL/uL — AB (ref 4.22–5.81)
RDW: 13.4 % (ref 11.5–15.5)
WBC: 9.8 10*3/uL (ref 4.0–10.5)
nRBC: 0 % (ref 0.0–0.2)

## 2018-04-16 LAB — URINALYSIS, COMPLETE (UACMP) WITH MICROSCOPIC
Bacteria, UA: NONE SEEN
Bilirubin Urine: NEGATIVE
Glucose, UA: NEGATIVE mg/dL
Ketones, ur: NEGATIVE mg/dL
Leukocytes,Ua: NEGATIVE
Nitrite: NEGATIVE
Protein, ur: 30 mg/dL — AB
Specific Gravity, Urine: 1.008 (ref 1.005–1.030)
Squamous Epithelial / HPF: NONE SEEN (ref 0–5)
pH: 6 (ref 5.0–8.0)

## 2018-04-16 LAB — HIV ANTIBODY (ROUTINE TESTING W REFLEX): HIV SCREEN 4TH GENERATION: NONREACTIVE

## 2018-04-16 MED ORDER — POTASSIUM CHLORIDE CRYS ER 20 MEQ PO TBCR
40.0000 meq | EXTENDED_RELEASE_TABLET | Freq: Once | ORAL | Status: AC
  Administered 2018-04-16: 40 meq via ORAL
  Filled 2018-04-16: qty 2

## 2018-04-16 MED ORDER — TRAZODONE HCL 50 MG PO TABS
50.0000 mg | ORAL_TABLET | Freq: Every day | ORAL | Status: DC
  Administered 2018-04-16 – 2018-04-17 (×2): 50 mg via ORAL
  Filled 2018-04-16 (×2): qty 1

## 2018-04-16 NOTE — NC FL2 (Signed)
[place] LEVEL OF CARE SCREENING TOOL     IDENTIFICATION  Patient Name: Richard Chapman Birthdate: 1940-10-11 Sex: male Admission Date (Current Location): 04/13/2018  [place] and [place] Number:  Engineering geologist and Address:  [facility], [address], [place], [place] [address]      Provider Number: [id number]  Attending Physician Name and Address:  Sela Hua, MD  Relative Name and Phone Number:  Lason, Eveland [phone]  [phone]     Current Level of Care: Hospital Recommended Level of Care: [place] Prior Approval Number:    Date Approved/Denied:   PASRR Number: [id number]  Discharge Plan: SNF    Current Diagnoses: Patient Active Problem List   Diagnosis Date Noted  . Sepsis due to pneumonia ([place]) 04/14/2018  . Bacteremia 12/15/2016  . Sepsis ([place]) 12/14/2016  . Atypical chest pain 09/24/2015  . Elevated WBC count 05/05/2015  . 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 CVA (cerebral infarction) 04/06/2015  . Cerebral infarction ([place]) 04/06/2015  . Malignant neoplasm of prostate ([place]) 01/29/2015  . Cervical disc disease 12/03/2014  . Chest pain 11/25/2014  . Arthritis 08/20/2014  . Benign fibroma of prostate 08/20/2014  . Clinical depression 08/20/2014  . Failure of erection 08/20/2014  . Enlarged prostate 08/20/2014  . Major depressive disorder with single episode 08/20/2014  . 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  . Benign essential HTN 08/04/2014  . Acute low back pain 04/23/2014  . Alteration in bowel elimination: incontinence 04/23/2014  . Urge incontinence 04/23/2014  . 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 Benign essential tremor 12/23/2013  . B12 deficiency 08/13/2013  . Chronic kidney disease (CKD), stage III (moderate) ([place]) 08/13/2013  . Diabetes ([place]) 08/13/2013  . Hypoglycemia 08/13/2013  . Diabetes mellitus ([place]) 08/13/2013  . Type  2 diabetes mellitus ([place]) 08/13/2013  . Arteriosclerosis of coronary artery 07/24/2013  . Fatigue 07/24/2013  . Acid reflux 07/24/2013  . Combined fat and carbohydrate induced hyperlipemia 07/24/2013  . CAD in native artery 07/24/2013  . Gastro-esophageal reflux disease without esophagitis 07/24/2013  . 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 Subdural hematoma ([place]) 03/26/2013  . Traumatic subdural hemorrhage ([place]) 03/26/2013  . Abnormal prostate specific antigen 09/01/2012  . Benign prostatic hyperplasia with urinary obstruction 09/01/2012  . Elevated prostate specific antigen (PSA) 09/01/2012    Orientation RESPIRATION BLADDER Height & Weight     Self, Time, Situation  Normal Continent Weight: 166 lb 12.8 oz (75.7 kg) Height:  5\' 8"  (172.7 cm)  BEHAVIORAL SYMPTOMS/MOOD NEUROLOGICAL BOWEL NUTRITION STATUS      Continent Diet(Cardiac Carb Modified)  AMBULATORY STATUS COMMUNICATION OF NEEDS Skin   Limited Assist Verbally Surgical wounds                       Personal Care Assistance Level of Assistance  Bathing, Feeding, Dressing Bathing Assistance: Limited assistance Feeding assistance: Independent Dressing Assistance: Limited assistance     Functional Limitations Info  Sight, Hearing, Speech Sight Info: Adequate Hearing Info: Adequate Speech Info: Adequate    SPECIAL CARE FACTORS FREQUENCY  PT (By licensed PT), OT (By licensed OT)     PT Frequency: minimum 5x a week OT Frequency: minimum 5x a week            Contractures Contractures Info: Not present    Additional Factors Info  Insulin Sliding Scale Code Status Info: Full Code Allergies Info: Lisinopril Psychotropic Info: escitalopram (LEXAPRO)  tablet 20 mg  Insulin Sliding Scale Info: insulin aspart (novoLOG) injection 0-15 Units 3x a day with meals       Current Medications (04/16/2018):  This is the current hospital active medication list Current Facility-Administered Medications   Medication Dose Route Frequency Provider Last Rate Last Dose  . acetaminophen (TYLENOL) tablet 650 mg  650 mg Oral Q6H PRN Arta Silence, MD   650 mg at 04/16/18 0858   Or  . acetaminophen (TYLENOL) suppository 650 mg  650 mg Rectal Q6H PRN Arta Silence, MD      . amLODipine (NORVASC) tablet 10 mg  10 mg Oral Daily Arta Silence, MD   10 mg at 04/16/18 0837  . aspirin chewable tablet 81 mg  81 mg Oral Daily Arta Silence, MD   81 mg at 04/16/18 0838  . atorvastatin (LIPITOR) tablet 5 mg  5 mg Oral Daily Arta Silence, MD   5 mg at 04/16/18 1711  . bisacodyl (DULCOLAX) EC tablet 5 mg  5 mg Oral Daily PRN Arta Silence, MD   5 mg at 04/16/18 0900  . cephALEXin (KEFLEX) capsule 500 mg  500 mg Oral BID Sela Hua, MD   500 mg at 04/16/18 0839  . clopidogrel (PLAVIX) tablet 75 mg  75 mg Oral Daily Arta Silence, MD   75 mg at 04/16/18 0837  . enoxaparin (LOVENOX) injection 40 mg  40 mg Subcutaneous Q24H Arta Silence, MD   40 mg at 04/16/18 0835  . escitalopram (LEXAPRO) tablet 20 mg  20 mg Oral Daily Arta Silence, MD   20 mg at 04/16/18 0836  . famotidine (PEPCID) tablet 20 mg  20 mg Oral Daily Arta Silence, MD   20 mg at 04/16/18 0837  . gabapentin (NEURONTIN) capsule 300 mg  300 mg Oral QHS Arta Silence, MD   300 mg at 04/15/18 2136  . hydrALAZINE (APRESOLINE) injection 5 mg  5 mg Intravenous Q4H PRN Mayo, Pete Pelt, MD   5 mg at 04/16/18 0421  . hydrochlorothiazide (HYDRODIURIL) tablet 25 mg  25 mg Oral Daily Mayo, Pete Pelt, MD   25 mg at 04/16/18 0837  . hyoscyamine (LEVBID) 0.375 MG 12 hr tablet 0.375 mg  0.375 mg Oral Q12H Arta Silence, MD   0.375 mg at 04/16/18 1711  . insulin aspart (novoLOG) injection 0-15 Units  0-15 Units Subcutaneous TID WC Arta Silence, MD   5 Units at 04/16/18 1205  . insulin aspart (novoLOG) injection 0-5 Units  0-5 Units Subcutaneous QHS Sridharan, Prasanna, MD      . insulin  glargine (LANTUS) injection 15 Units  15 Units Subcutaneous QHS Arta Silence, MD   15 Units at 04/15/18 2144  . ipratropium-albuterol (DUONEB) 0.5-2.5 (3) MG/3ML nebulizer solution 3 mL  3 mL Nebulization Once Sridharan, Prasanna, MD      . ipratropium-albuterol (DUONEB) 0.5-2.5 (3) MG/3ML nebulizer solution 3 mL  3 mL Nebulization BID Mayo, Pete Pelt, MD   3 mL at 04/16/18 0752  . isosorbide mononitrate (IMDUR) 24 hr tablet 60 mg  60 mg Oral BID Arta Silence, MD   60 mg at 04/16/18 0837  . losartan (COZAAR) tablet 100 mg  100 mg Oral Daily Arta Silence, MD   100 mg at 04/16/18 0838  . morphine 2 MG/ML injection 2 mg  2 mg Intravenous Q3H PRN Arta Silence, MD   2 mg at 04/15/18 0811   Or  . morphine 4 MG/ML injection 4 mg  4 mg Intravenous Q3H PRN Sridharan,  Prasanna, MD   4 mg at 04/15/18 0137  . nitroGLYCERIN (NITROSTAT) SL tablet 0.4 mg  0.4 mg Sublingual Q5 min PRN Arta Silence, MD   0.4 mg at 04/15/18 0458  . ondansetron (ZOFRAN) tablet 4 mg  4 mg Oral Q6H PRN Arta Silence, MD       Or  . ondansetron (ZOFRAN) injection 4 mg  4 mg Intravenous Q6H PRN Arta Silence, MD   4 mg at 04/16/18 0420  . pantoprazole (PROTONIX) EC tablet 40 mg  40 mg Oral Daily Arta Silence, MD   40 mg at 04/16/18 0838  . primidone (MYSOLINE) tablet 50 mg  50 mg Oral BID Arta Silence, MD   50 mg at 04/16/18 0836  . propranolol (INDERAL) tablet 20 mg  20 mg Oral BID Arta Silence, MD   20 mg at 04/16/18 1214  . senna-docusate (Senokot-S) tablet 1 tablet  1 tablet Oral QHS PRN Arta Silence, MD      . tamsulosin (FLOMAX) capsule 0.4 mg  0.4 mg Oral Daily Arta Silence, MD   0.4 mg at 04/16/18 0835  . vitamin B-12 (CYANOCOBALAMIN) tablet 1,000 mcg  1,000 mcg Oral Daily Arta Silence, MD   1,000 mcg at 04/16/18 0837  . white petrolatum (VASELINE) gel   Topical PRN Vaughan Basta, MD         Discharge Medications: Please see  discharge summary for a list of discharge medications.  Relevant Imaging Results:  Relevant Lab Results:   Additional Information SSN [id number]  Ross Ludwig, LCSW

## 2018-04-16 NOTE — Progress Notes (Addendum)
[place] at [place] NAME: Burhanuddin Kohlmann    MR#:  [id number]  DATE OF BIRTH:  [birth year]  SUBJECTIVE:   Patient had some confusion yesterday.  He was not really sure where he was.  Doing better this morning.  Denies any chest pain or shortness of breath.  REVIEW OF SYSTEMS:  Review of Systems  Constitutional: Negative for chills and fever.  HENT: Negative for congestion and sore throat.   Eyes: Negative for blurred vision and double vision.  Respiratory: Negative for cough and shortness of breath.   Cardiovascular: Negative for chest pain and palpitations.  Gastrointestinal: Negative for nausea and vomiting.  Genitourinary: Negative for dysuria and urgency.  Musculoskeletal: Negative for back pain and neck pain.  Neurological: Negative for dizziness and headaches.  Psychiatric/Behavioral: Negative for depression. The patient is not nervous/anxious.     DRUG ALLERGIES:   Allergies  Allergen Reactions  . Lisinopril Other (See Comments)   VITALS:  Blood pressure (!) 147/68, pulse 67, temperature 98.1 F (36.7 C), temperature source Oral, resp. rate 19, height 5\' 8"  (1.727 m), weight 75.7 kg, SpO2 97 %. PHYSICAL EXAMINATION:  Physical Exam  GENERAL:  Laying in the bed with no acute distress.  HEENT: Head atraumatic, normocephalic. Pupils equal, round, reactive to light and accommodation. No scleral icterus. Extraocular muscles intact. Oropharynx and nasopharynx clear.  NECK:  Supple, no jugular venous distention. No thyroid enlargement. LUNGS: Lungs are clear to auscultation bilaterally. No wheezes, crackles, rhonchi. No use of accessory muscles of respiration.  CARDIOVASCULAR: RRR, S1, S2 normal. No murmurs, rubs, or gallops.  ABDOMEN: Soft, nontender, nondistended. Bowel sounds present.  EXTREMITIES: No pedal edema, cyanosis, or clubbing.  NEUROLOGIC: CN 2-12 intact, no focal deficits. + Global weakness. Sensation intact throughout.  Gait not checked.  PSYCHIATRIC: The patient is alert and oriented x 3.  SKIN: No obvious rash, lesion, or ulcer. + Dry bandage in place over left hand. LABORATORY PANEL:  Male CBC Recent Labs  Lab 04/16/18 0546  WBC 9.8  HGB 12.2*  HCT 35.2*  PLT 176   ------------------------------------------------------------------------------------------------------------------ Chemistries  Recent Labs  Lab 04/13/18 2158  04/16/18 0546  NA 136   < > 133*  K 4.1   < > 3.4*  CL 101   < > 98  CO2 25   < > 25  GLUCOSE 142*   < > 130*  BUN 28*   < > 35*  CREATININE 1.53*   < > 1.45*  CALCIUM 9.3   < > 9.0  MG 1.5*  --   --   AST 17  --   --   ALT 15  --   --   ALKPHOS 62  --   --   BILITOT 1.0  --   --    < > = values in this interval not displayed.   RADIOLOGY:  Ct Head Wo Contrast  Result Date: 04/15/2018 CLINICAL DATA:  Hypertensive.  History of diabetes and hypertension. EXAM: CT HEAD WITHOUT CONTRAST TECHNIQUE: Contiguous axial images were obtained from the base of the skull through the vertex without intravenous contrast. COMPARISON:  CT HEAD April 05, 2015 FINDINGS: BRAIN: No intraparenchymal hemorrhage, mass effect nor midline shift. No parenchymal brain volume loss for age. No hydrocephalus. Confluent supratentorial white matter hypodensities. 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 Old RIGHT basal ganglia and bilateral thalamus lacunar infarcts; LEFT thalamus infarct is new. No acute large vascular territory infarcts. No abnormal extra-axial fluid  collections. Basal cisterns are patent. VASCULAR: Mild calcific atherosclerosis of the carotid siphons. SKULL: No skull fracture. Multiple old burr holes. No significant scalp soft tissue swelling. SINUSES/ORBITS: Chronic LEFT mastoid effusion with air cell coalescence. RIGHT mastoid air cells well aerated.The included ocular globes and orbital contents are non-suspicious. Status post bilateral ocular lens implants. OTHER: None. IMPRESSION: 1. No  acute intracranial process. 2. Old basal ganglia, pontine and thalami infarcts, increased in number from prior examination. 3. Severe chronic small vessel ischemic changes. Electronically Signed   By: Elon Alas M.D.   On: 04/15/2018 15:46   US Renal  Result Date: 04/16/2018 CLINICAL DATA:  Acute kidney injury. EXAM: RENAL / URINARY TRACT ULTRASOUND COMPLETE COMPARISON:  None. FINDINGS: Right Kidney: Renal measurements: 10.1 x 5.0 x 4.5 cm = volume: 118 mL . Echogenicity within normal limits. No mass or hydronephrosis visualized. Left Kidney: Renal measurements: 9.9 x 5.4 x 4.6 cm = volume: 129 mL. Normal parenchymal echogenicity. Exophytic 2 cm cyst arises from the upper pole. No other masses, no stones and no hydronephrosis. Bladder: Appears normal for degree of bladder distention. IMPRESSION: 1. No acute findings.  No hydronephrosis. 2. 2 cm upper pole left renal cyst.  No other abnormalities. Electronically Signed   By: Lajean Manes M.D.   On: 04/16/2018 10:02   ASSESSMENT AND PLAN:   Acute hypoxic respiratory failure-resolved.  Initially felt to be secondary to community-acquired pneumonia, but repeat chest x-ray without any infiltrate and procalcitonin negative.  Patient without fevers, chills, shortness of breath, or cough. -Antibiotics discontinued  -Duonebs as needed -Continue to monitor  Sepsis-patient meeting sepsis criteria on admission.  Sepsis ruled out with negative cultures.  Uncontrolled hypertension-blood pressures have greatly improved -Continue Norvasc, losartan, HCTZ  Elevated troponins-likely secondary to demand ischemia.  No chest pain. -Echo read pending  Generalized weakness-unclear etiology.  May be secondary to deconditioning. Wife very concerned that patient is constantly leaning towards the left side for the last couple of days. CT head negative for acute abnormalities. -Will obtain MRI brain to rule out stroke -PT recommending SNF  Type 2  diabetes -Continue Lantus and SSI  AKI on CKD 3-creatinine improved from 1.67 > 1.45.  -Encourage p.o. 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  Awaiting bed placement.  All the records are reviewed and case discussed with Care Management/Social Worker. Management plans discussed with the patient, family and they are in agreement.  CODE STATUS: Full Code  TOTAL TIME TAKING CARE OF THIS PATIENT: 40 minutes.   More than 50% of the time was spent in counseling/coordination of care: YES  POSSIBLE D/C pending, DEPENDING ON CLINICAL CONDITION.   Berna Spare Zayana Salvador M.D on 04/16/2018 at 1:26 PM  Between 7am to 6pm - Pager [phone]  After 6pm go to [email or web address] - Proofreader  [facility]  Office  [phone]  CC: Primary care physician; Leonel Ramsay, MD  Note: This dictation was prepared with Dragon dictation along with smaller phrase technology. Any transcriptional errors that result from this process are unintentional.

## 2018-04-17 ENCOUNTER — Inpatient Hospital Stay: Payer: Medicare Other

## 2018-04-17 LAB — GLUCOSE, CAPILLARY
Glucose-Capillary: 196 mg/dL — ABNORMAL HIGH (ref 70–99)
Glucose-Capillary: 230 mg/dL — ABNORMAL HIGH (ref 70–99)
Glucose-Capillary: 105 mg/dL — ABNORMAL HIGH (ref 70–99)
Glucose-Capillary: 146 mg/dL — ABNORMAL HIGH (ref 70–99)

## 2018-04-17 MED ORDER — SENNOSIDES-DOCUSATE SODIUM 8.6-50 MG PO TABS
2.0000 | ORAL_TABLET | Freq: Two times a day (BID) | ORAL | Status: DC
  Administered 2018-04-17 – 2018-04-19 (×5): 2 via ORAL
  Filled 2018-04-17 (×5): qty 2

## 2018-04-17 MED ORDER — QUETIAPINE FUMARATE 25 MG PO TABS
25.0000 mg | ORAL_TABLET | Freq: Once | ORAL | Status: AC
  Administered 2018-04-17: 25 mg via ORAL
  Filled 2018-04-17: qty 1

## 2018-04-17 MED ORDER — POLYETHYLENE GLYCOL 3350 17 G PO PACK
17.0000 g | PACK | Freq: Every day | ORAL | Status: DC
  Administered 2018-04-17 – 2018-04-19 (×3): 17 g via ORAL
  Filled 2018-04-17 (×3): qty 1

## 2018-04-17 MED ORDER — BISACODYL 10 MG RE SUPP: 10.0000 mg | Freq: Every day | RECTAL | Status: DC | PRN

## 2018-04-17 NOTE — Care Management Important Message (Signed)
Copy of signed Medicare IM left with patient in room. 

## 2018-04-17 NOTE — Care Management Note (Signed)
Case Management Note  Patient Details  Name: Richard Chapman MRN: [id number] Date of Birth: Jul 29, 1940  Subjective/Objective:       Patient is from home with wife and family.  He is current with his PCP, Dr. Wynetta Emery.  Obtains medications through the [place] and mail order without difficulty.  Uses a cane most of the time and has access to a walker.  No equipment needs.    PT has recommended SNF.  Patients wife would like [facility].  Notified Eric, LCSW.  No further needs identified by [facility].             Action/Plan:   Expected Discharge Date:                  Expected Discharge Plan:  Skilled Nursing Facility  In-House Referral:  Clinical Social Work  Discharge planning Services  CM Consult  Post Acute Care Choice:    Choice offered to:     DME Arranged:    DME Agency:     HH Arranged:    [place] Agency:     Status of Service:  Completed, signed off  If discussed at [employer], dates discussed:    Additional Comments:  Elza Rafter, RN 04/17/2018, 10:36 AM

## 2018-04-17 NOTE — Progress Notes (Signed)
Changed patient's L hip and L finger wound dressings at this time over graft sites, as ordered Q shift. They apparently haven't been changed since late last afternoon, for reasons unknown. Used a 2x2 and a 4x4. Patient tolerated well. Wife very involved with wound care instructions / sterility, etc. Will pass along in shift report. Will continue to monitor wound dressing status for the remainder of the shift. Wenda [facility]

## 2018-04-17 NOTE — Progress Notes (Signed)
[place] at [place] NAME: Richard Chapman    MR#:  [id number]  DATE OF BIRTH:  May 18, 1940  SUBJECTIVE:   Patient states he is feeling great this morning.  He is happy to be sitting up in the chair.  He denies any chest pain or shortness of breath.  He was evaluated by PT and they recommended SNF.  He is agreeable to short-term rehab.  REVIEW OF SYSTEMS:  Review of Systems  Constitutional: Negative for chills and fever.  HENT: Negative for congestion and sore throat.   Eyes: Negative for blurred vision and double vision.  Respiratory: Negative for cough and shortness of breath.   Cardiovascular: Negative for chest pain and palpitations.  Gastrointestinal: Negative for nausea and vomiting.  Genitourinary: Negative for dysuria and urgency.  Musculoskeletal: Negative for back pain and neck pain.  Neurological: Negative for dizziness and headaches.  Psychiatric/Behavioral: Negative for depression. The patient is not nervous/anxious.     DRUG ALLERGIES:   Allergies  Allergen Reactions  . Lisinopril Other (See Comments)   VITALS:  Blood pressure 131/81, pulse 89, temperature (!) 97.5 F (36.4 C), temperature source Oral, resp. rate 19, height 5\' 8"  (1.727 m), weight 75.7 kg, SpO2 99 %. PHYSICAL EXAMINATION:  Physical Exam  GENERAL: Sitting up in chair with no acute distress.  HEENT: Head atraumatic, normocephalic. Pupils equal, round, reactive to light and accommodation. No scleral icterus. Extraocular muscles intact. Oropharynx and nasopharynx clear.  NECK:  Supple, no jugular venous distention. No thyroid enlargement. LUNGS: Lungs are clear to auscultation bilaterally. No wheezes, crackles, rhonchi. No use of accessory muscles of respiration.  CARDIOVASCULAR: RRR, S1, S2 normal. No murmurs, rubs, or gallops.  ABDOMEN: Soft, nontender, nondistended. Bowel sounds present.  EXTREMITIES: No pedal edema, cyanosis, or clubbing.  NEUROLOGIC: CN  2-12 intact, no focal deficits. + Global weakness. Sensation intact throughout. Gait not checked.  PSYCHIATRIC: The patient is alert and oriented x 3.  SKIN: No obvious rash, lesion, or ulcer. + Dry bandage in place over left hand. LABORATORY PANEL:  Male CBC Recent Labs  Lab 04/16/18 0546  WBC 9.8  HGB 12.2*  HCT 35.2*  PLT 176   ------------------------------------------------------------------------------------------------------------------ Chemistries  Recent Labs  Lab 04/13/18 2158  04/16/18 0546  NA 136   < > 133*  K 4.1   < > 3.4*  CL 101   < > 98  CO2 25   < > 25  GLUCOSE 142*   < > 130*  BUN 28*   < > 35*  CREATININE 1.53*   < > 1.45*  CALCIUM 9.3   < > 9.0  MG 1.5*  --   --   AST 17  --   --   ALT 15  --   --   ALKPHOS 62  --   --   BILITOT 1.0  --   --    < > = values in this interval not displayed.   RADIOLOGY:  Mr Brain Wo Contrast  Result Date: 04/17/2018 CLINICAL DATA:  Altered mental status EXAM: MRI HEAD WITHOUT CONTRAST TECHNIQUE: Multiplanar, multiecho pulse sequences of the brain and surrounding structures were obtained without intravenous contrast. COMPARISON:  Head CT 04/15/2018 FINDINGS: BRAIN: There is no acute infarct, acute hemorrhage, hydrocephalus or extra-axial collection. There are multiple old small vessel infarcts of the cerebellum, brainstem and basal ganglia. Diffuse confluent hyperintense T2-weighted signal within the periventricular, deep and juxtacortical white matter, most commonly due  to chronic ischemic microangiopathy. Generalized atrophy without lobar predilection. Susceptibility-sensitive sequences show no chronic microhemorrhage or superficial siderosis. VASCULAR: Major intracranial arterial and venous sinus flow voids are normal. SKULL AND UPPER CERVICAL SPINE: Calvarial bone marrow signal is normal. There is no skull base mass. Visualized upper cervical spine and soft tissues are normal. SINUSES/ORBITS: No fluid levels or advanced  mucosal thickening. No mastoid or middle ear effusion. The orbits are normal. IMPRESSION: 1. No acute intracranial abnormality. 2. 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 Electronically Signed   By: Ulyses Jarred M.D.   On: 04/17/2018 03:20   ASSESSMENT AND PLAN:   Acute hypoxic respiratory failure-resolved.  Initially felt to be secondary to community-acquired pneumonia, but repeat chest x-ray without any infiltrate and procalcitonin negative.  Patient without fevers, chills, shortness of breath, or cough. -Antibiotics discontinued  -Duonebs as needed -Continue to monitor  Sepsis-patient meeting sepsis criteria on admission.  Sepsis ruled out with negative cultures.  Uncontrolled hypertension-blood pressures have improved -Continue Norvasc, losartan, HCTZ  Elevated troponins-likely secondary to demand ischemia.  No chest pain. -Echo read pending  Generalized weakness- unclear etiology.  May be secondary to deconditioning. Wife very concerned that patient is constantly leaning towards the left side for the last couple of days. CT head and MRI brain negative for acute abnormalities. -PT recommending SNF-awaiting placement  Type 2 diabetes -Continue Lantus and SSI  AKI on CKD 3-creatinine improved from 1.67 > 1.45.  -Encourage p.o. 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  Awaiting bed placement.  All the records are reviewed and case discussed with Care Management/Social Worker. Management plans discussed with the patient, family and they are in agreement.  CODE STATUS: Full Code  TOTAL TIME TAKING CARE OF THIS PATIENT: 32 minutes.   More than 50% of the time was spent in  counseling/coordination of care: YES  POSSIBLE D/C pending, DEPENDING ON CLINICAL CONDITION.   Berna Spare Christian Borgerding M.D on 04/17/2018 at 2:17 PM  Between 7am to 6pm - Pager [phone]  After 6pm go to [email or web address] - Proofreader  [facility]  Office  [phone]  CC: Primary care physician; Leonel Ramsay, MD  Note: This dictation was prepared with Dragon dictation along with smaller phrase technology. Any transcriptional errors that result from this process are unintentional.

## 2018-04-17 NOTE — Progress Notes (Signed)
Called pt's wife at the cell phone number in chart. Got voicemail. Left HIPAA compliant message for her to call me back.

## 2018-04-17 NOTE — Plan of Care (Signed)
  Problem: Health Behavior/Discharge Planning: Goal: Ability to manage health-related needs will improve Outcome: Progressing Note:  Patient has remained chest pain free for the duration of day shift today. Will continue to monitor for any angina for the remainder of the day. Richard Chapman [facility]

## 2018-04-17 NOTE — Progress Notes (Signed)
Physical Therapy Treatment Patient Details Name: Richard Chapman MRN: [id number] DOB: 1940/12/30 Today's Date: 04/17/2018    History of Present Illness Pt is 78 y.o male that presented to the ED due to weakness, chest pain, and falling out of his chair. EMS gave pt 1 Nitroglycerin and pt reported is minimally alleviated symptoms. Chest Xray indicated bilateral infiltrates L>R. Per RN no cardio consult at this time and chest pain likely pleuritic in origin. Pt receiving skin grafts on L hand in outpatient setting. PMHx of skin CA, HTN, DM, subarachnoid bleeding, and TIA.     PT Comments    Patient received in bed, wife present. Agrees to work with PT. Patient requires assistance with bed mobility due to impulsivity and decreased safety awareness. Patient requires +2 mod assist with transfers and out of bed activities. Patient was able to ambulate 125 feet with RW, but was quite unsafe requiring +2 mod assist for safety during ambulation and to prevent loss of balance. Patient will continue to benefit from skilled PT to address his decreased safety, and weakness.  Patient is unsafe to return home at this time.      Follow Up Recommendations  SNF     Equipment Recommendations       Recommendations for Other Services       Precautions / Restrictions Precautions Precautions: Fall Restrictions Weight Bearing Restrictions: No    Mobility  Bed Mobility Overal bed mobility: Needs Assistance Bed Mobility: Supine to Sit     Supine to sit: Min assist;+2 for safety/equipment     General bed mobility comments: Patient impuslive with bed mobility, trying to get up without rail being put down, trying to stand before safe and ready.   Transfers Overall transfer level: Needs assistance Equipment used: Rolling walker (2 wheeled) Transfers: Sit to/from Stand Sit to Stand: Mod assist;+2 physical assistance         General transfer comment: Patient is very unsafe with transfer, poor balance  requiring +2 assist.   Ambulation/Gait Ambulation/Gait assistance: Mod assist;+2 physical assistance;+2 safety/equipment Gait Distance (Feet): 125 Feet Assistive device: Rolling walker (2 wheeled) Gait Pattern/deviations: Step-to pattern;Ataxic;Staggering left;Staggering right;Narrow base of support     General Gait Details: Patient impulsive and unsafe with mobility. Requires +2 assist for safety. Patients ambulation did improve when out in hallway. In room he has increased difficulty navigating requiring mod/max assist and increased cues to remain safe.    Stairs             Wheelchair Mobility    Modified Rankin (Stroke Patients Only)       Balance Overall balance assessment: Needs assistance Sitting-balance support: Feet supported;Bilateral upper extremity supported Sitting balance-Leahy Scale: Fair     Standing balance support: Bilateral upper extremity supported;During functional activity Standing balance-Leahy Scale: Poor Standing balance comment: requires external support to maintain safety with standing dynamic balance in addition to RW.                             Cognition Arousal/Alertness: Awake/alert Behavior During Therapy: Impulsive Overall Cognitive Status: Impaired/Different from baseline Area of Impairment: Safety/judgement;Awareness;Problem solving;Following commands;Orientation                       Following Commands: Follows one step commands inconsistently Safety/Judgement: Decreased awareness of safety;Decreased awareness of deficits   Problem Solving: Requires verbal cues;Requires tactile cues General Comments: wife reports increased confusion this morning compared to  yesterday      Exercises      General Comments        Pertinent Vitals/Pain Pain Assessment: No/denies pain    Home Living Family/patient expects to be discharged to:: Private residence Living Arrangements: Spouse/significant other Available  Help at Discharge: Family;Available 24 hours/day Type of Home: House Home Access: Stairs to enter Entrance Stairs-Rails: None Home Layout: One level Home Equipment: Environmental consultant - 2 wheels;Walker - 4 wheels;Cane - quad;Shower seat;Hand held shower head;Wheelchair - manual      Prior Function Level of Independence: Needs assistance  Gait / Transfers Assistance Needed: PTA pt ambulating household distances with use of SPC, although he has RW, rollator and WC. Pt is no longer driving and reports 1 fall in the past 6 months.  ADL's / Homemaking Assistance Needed: Pt required assistance for all ADLs.      PT Goals (current goals can now be found in the care plan section) Acute Rehab PT Goals Patient Stated Goal: to get stronger and go home  PT Goal Formulation: With patient/family Time For Goal Achievement: 04/28/18 Potential to Achieve Goals: Fair    Frequency    Min 2X/week      PT Plan Current plan remains appropriate    Co-evaluation              AM-PAC PT "6 Clicks" Mobility   Outcome Measure  Help needed turning from your back to your side while in a flat bed without using bedrails?: A Lot Help needed moving from lying on your back to sitting on the side of a flat bed without using bedrails?: A Little Help needed moving to and from a bed to a chair (including a wheelchair)?: A Lot Help needed standing up from a chair using your arms (e.g., wheelchair or bedside chair)?: A Lot Help needed to walk in hospital room?: A Lot Help needed climbing 3-5 steps with a railing? : A Lot 6 Click Score: 13    End of Session Equipment Utilized During Treatment: Gait belt Activity Tolerance: Patient tolerated treatment well Patient left: in chair;with family/visitor present;with chair alarm set Nurse Communication: Mobility status PT Visit Diagnosis: Muscle weakness (generalized) (M62.81);Difficulty in walking, not elsewhere classified (R26.2);History of falling (Z91.81);Unsteadiness  on feet (R26.81)     Time: 3159-4585 PT Time Calculation (min) (ACUTE ONLY): 17 min  Charges:  $Gait Training: 8-22 mins                     Deneisha Dade, PT, GCS 04/17/18,9:37 AM

## 2018-04-17 NOTE — Progress Notes (Signed)
Patient taken to MRI at this time with transoprt.

## 2018-04-17 NOTE — Progress Notes (Signed)
   04/17/18 2036  What Happened  Was fall witnessed? No  Was patient injured? No  Patient found on floor  Found by Staff-comment (nurse tech)  Stated prior activity ambulating-unassisted  Follow Up  MD notified Sudini  Time MD notified 2040  Adult Fall Risk Assessment  Risk Factor Category (scoring not indicated) High fall risk per protocol (document High fall risk)  Age 78  Fall History: Fall within 6 months prior to admission 0  Elimination; Bowel and/or Urine Incontinence 0  Elimination; Bowel and/or Urine Urgency/Frequency 2  Medications: includes PCA/Opiates, Anti-convulsants, Anti-hypertensives, Diuretics, Hypnotics, Laxatives, Sedatives, and Psychotropics 5  Patient Care Equipment 1  Mobility-Assistance 2  Mobility-Gait 2  Mobility-Sensory Deficit 2  Altered awareness of immediate physical environment 0  Impulsiveness 2  Lack of understanding of one's physical/cognitive limitations 4  Total Score 22  Patient Fall Risk Level High fall risk  Adult Fall Risk Interventions  Required Bundle Interventions *See Row Information* High fall risk - low, moderate, and high requirements implemented  Additional Interventions Safety Sitter/Safety Rounder;Room near nurses station;Use of appropriate toileting equipment (bedpan, BSC, etc.)  Screening for Fall Injury Risk (To be completed on HIGH fall risk patients) - Assessing Need for Low Bed  Risk For Fall Injury- Low Bed Criteria None identified - Continue screening  Screening for Fall Injury Risk (To be completed on HIGH fall risk patients who do not meet crieteria for Low Bed) - Assessing Need for Floor Mats Only  Risk For Fall Injury- Criteria for Floor Mats Confusion/dementia (+CAM, CIWA, TBI, etc.)  Will Implement Floor Mats Yes  Vitals  BP (!) 156/113  MAP (mmHg) 127  BP Location Left Arm  BP Method Automatic  Patient Position (if appropriate) Sitting  Pulse Rate 79  Cardiac Rhythm NSR  Heart Block Type 1st degree AVB  Oxygen  Therapy  SpO2 100 %  Neurological  Neuro (WDL) X  Orientation Level Disoriented to time;Disoriented to situation  Cognition Memory impairment;Poor judgement;Poor safety awareness  Neuro Symptoms Forgetful;Anxiety  Musculoskeletal  Musculoskeletal (WDL) X  Assistive Device BSC  Generalized Weakness Yes  Weight Bearing Restrictions No  Integumentary  Integumentary (WDL) X  Skin Condition Flaky  Skin Integrity Other (Comment) (skin graft L thigh, L middle finger)  Ecchymosis Location Back  Ecchymosis Location Orientation Bilateral

## 2018-04-18 LAB — GLUCOSE, CAPILLARY
Glucose-Capillary: 136 mg/dL — ABNORMAL HIGH (ref 70–99)
Glucose-Capillary: 97 mg/dL (ref 70–99)
Glucose-Capillary: 102 mg/dL — ABNORMAL HIGH (ref 70–99)
Glucose-Capillary: 114 mg/dL — ABNORMAL HIGH (ref 70–99)

## 2018-04-18 LAB — CULTURE, BLOOD (ROUTINE X 2)
Culture: NO GROWTH
Culture: NO GROWTH

## 2018-04-18 MED ORDER — LORAZEPAM 2 MG/ML IJ SOLN
1.0000 mg | Freq: Once | INTRAMUSCULAR | Status: AC
  Administered 2018-04-18: 1 mg via INTRAVENOUS
  Filled 2018-04-18: qty 1

## 2018-04-18 MED ORDER — QUETIAPINE FUMARATE 25 MG PO TABS: 25.0000 mg | ORAL_TABLET | Freq: Every day | ORAL | Status: DC

## 2018-04-18 MED ORDER — SODIUM CHLORIDE 0.9 % IV BOLUS
250.0000 mL | Freq: Once | INTRAVENOUS | Status: AC
  Administered 2018-04-18: 250 mL via INTRAVENOUS

## 2018-04-18 MED ORDER — HALOPERIDOL LACTATE 5 MG/ML IJ SOLN: 1.0000 mg | Freq: Four times a day (QID) | INTRAMUSCULAR | Status: DC | PRN

## 2018-04-18 MED ORDER — INSULIN GLARGINE 100 UNIT/ML ~~LOC~~ SOLN
7.0000 [IU] | Freq: Every day | SUBCUTANEOUS | Status: DC
  Administered 2018-04-18: 7 [IU] via SUBCUTANEOUS
  Filled 2018-04-18 (×2): qty 0.07

## 2018-04-18 MED ORDER — TRAZODONE HCL 100 MG PO TABS
100.0000 mg | ORAL_TABLET | Freq: Every day | ORAL | Status: DC
  Administered 2018-04-18: 100 mg via ORAL
  Filled 2018-04-18: qty 1

## 2018-04-18 MED ORDER — QUETIAPINE FUMARATE 25 MG PO TABS: 25.0000 mg | ORAL_TABLET | Freq: Every evening | ORAL | Status: DC | PRN

## 2018-04-18 MED ORDER — HALOPERIDOL LACTATE 5 MG/ML IJ SOLN
2.0000 mg | Freq: Once | INTRAMUSCULAR | Status: AC
  Administered 2018-04-18: 2 mg via INTRAVENOUS
  Filled 2018-04-18: qty 1

## 2018-04-18 NOTE — Progress Notes (Signed)
Patient ID: Richard Chapman, male   DOB: [birth year], 78 y.o.   MRN: [id number]  [employer] PROGRESS NOTE  Richard Chapman NTZ:[id number] DOB: August 04, 1940 DOA: 04/13/2018 PCP: Richard Ramsay, MD  HPI/Subjective: Patient answers a few questions.  Very slow with his answers.  Seems lethargic.  Needed medication last night to sleep and received Haldol, Ativan and Seroquel.  As per nursing staff the patient did not sleep.  Patient also had a fall last night. Objective: Vitals:   04/17/18 2036 04/18/18 0823  BP: (!) 156/113 (!) 166/93  Pulse: 79 70  Resp:    Temp:    SpO2: 100% 100%   No intake or output data in the 24 hours ending 04/18/18 1354 Filed Weights   04/13/18 2154 04/14/18 0633 04/16/18 0324  Weight: 78.9 kg 76 kg 75.7 kg    ROS: Review of Systems  Unable to perform ROS: Acuity of condition  Respiratory: Positive for shortness of breath.   Cardiovascular: Positive for chest pain.  Gastrointestinal: Negative for abdominal pain.   Exam: Physical Exam  HENT:  Nose: No mucosal edema.  Mouth/Throat: No oropharyngeal exudate or posterior oropharyngeal edema.  Eyes: Pupils are equal, round, and reactive to light. Conjunctivae and lids are normal.  Neck: No JVD present. Carotid bruit is not present. No edema present. No thyroid mass and no thyromegaly present.  Cardiovascular: S1 normal and S2 normal. Exam reveals no gallop.  No murmur heard. Pulses:      Dorsalis pedis pulses are 2+ on the right side and 2+ on the left side.  Respiratory: No respiratory distress. He has no wheezes. He has no rhonchi. He has no rales.  GI: Soft. Bowel sounds are normal. There is no abdominal tenderness.  Musculoskeletal:     Right ankle: He exhibits swelling.     Left ankle: He exhibits swelling.  Lymphadenopathy:    He has no cervical adenopathy.  Neurological: He is alert.  Able to move all extremities  Skin: Skin is warm. No rash noted. Nails show no clubbing.  Psychiatric:  His affect is blunt.      Data Reviewed: Basic Metabolic Panel: Recent Labs  Lab 04/13/18 2158 04/15/18 0347 04/16/18 0546  NA 136 137 133*  K 4.1 4.3 3.4*  CL 101 101 98  CO2 25 29 25   GLUCOSE 142* 155* 130*  BUN 28* 28* 35*  CREATININE 1.53* 1.67* 1.45*  CALCIUM 9.3 9.0 9.0  MG 1.5*  --   --   PHOS 3.7  --   --    Liver Function Tests: Recent Labs  Lab 04/13/18 2158  AST 17  ALT 15  ALKPHOS 62  BILITOT 1.0  PROT 6.7  ALBUMIN 3.7   CBC: Recent Labs  Lab 04/13/18 2158 04/15/18 0347 04/16/18 0546  WBC 12.0* 9.8 9.8  HGB 13.8 12.8* 12.2*  HCT 40.5 37.4* 35.2*  MCV 91.2 90.1 90.3  PLT 191 177 176   Cardiac Enzymes: Recent Labs  Lab 04/13/18 2158 04/14/18 0551 04/14/18 1708  TROPONINI <0.03 0.03* 0.03*   BNP (last 3 results) Recent Labs    04/13/18 2158  BNP 79.0     CBG: Recent Labs  Lab 04/17/18 1142 04/17/18 1629 04/17/18 2119 04/18/18 0824 04/18/18 1245  GLUCAP 230* 105* 146* 136* 97    Recent Results (from the past 240 hour(s))  Blood culture (routine x 2)     Status: None   Collection Time: 04/13/18 11:39 PM  Result Value Ref Range  Status   Specimen Description BLOOD RIGHT ANTECUBITAL  Final   Special Requests   Final    BOTTLES DRAWN AEROBIC AND ANAEROBIC Blood Culture results may not be optimal due to an excessive volume of blood received in culture bottles   Culture   Final    NO GROWTH 5 DAYS Performed at [facility], [address] [place], [place] [address]    Report Status 04/18/2018 FINAL  Final  Blood culture (routine x 2)     Status: None   Collection Time: 04/13/18 11:39 PM  Result Value Ref Range Status   Specimen Description BLOOD LEFT ANTECUBITAL  Final   Special Requests   Final    BOTTLES DRAWN AEROBIC AND ANAEROBIC Blood Culture results may not be optimal due to an excessive volume of blood received in culture bottles   Culture   Final    NO GROWTH 5 DAYS Performed at [facility],  [address], [place], [place] [address]    Report Status 04/18/2018 FINAL  Final     Studies: Mr Brain Wo Contrast  Result Date: 04/17/2018 CLINICAL DATA:  Altered mental status EXAM: MRI HEAD WITHOUT CONTRAST TECHNIQUE: Multiplanar, multiecho pulse sequences of the brain and surrounding structures were obtained without intravenous contrast. COMPARISON:  Head CT 04/15/2018 FINDINGS: BRAIN: There is no acute infarct, acute hemorrhage, hydrocephalus or extra-axial collection. There are multiple old small vessel infarcts of the cerebellum, brainstem and basal ganglia. Diffuse confluent hyperintense T2-weighted signal within the periventricular, deep and juxtacortical white matter, most commonly due to chronic ischemic microangiopathy. Generalized atrophy without lobar predilection. Susceptibility-sensitive sequences show no chronic microhemorrhage or superficial siderosis. VASCULAR: Major intracranial arterial and venous sinus flow voids are normal. SKULL AND UPPER CERVICAL SPINE: Calvarial bone marrow signal is normal. There is no skull base mass. Visualized upper cervical spine and soft tissues are normal. SINUSES/ORBITS: No fluid levels or advanced mucosal thickening. No mastoid or middle ear effusion. The orbits are normal. IMPRESSION: 1. No acute intracranial abnormality. 2. 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 Electronically Signed   By: Ulyses Jarred M.D.   On: 04/17/2018 03:20    Scheduled Meds: . amLODipine  10 mg Oral Daily  . aspirin  81 mg Oral Daily  . atorvastatin  5 mg Oral Daily  . cephALEXin  500 mg Oral BID  . clopidogrel  75 mg Oral Daily  . enoxaparin (LOVENOX) injection  40 mg Subcutaneous Q24H  . escitalopram  20 mg Oral Daily  . famotidine  20 mg Oral Daily  . hydrochlorothiazide  25 mg Oral Daily  . hyoscyamine  0.375 mg Oral Q12H  . insulin aspart  0-15 Units Subcutaneous TID WC  . insulin aspart  0-5 Units  Subcutaneous QHS  . insulin glargine  15 Units Subcutaneous QHS  . ipratropium-albuterol  3 mL Nebulization Once  . ipratropium-albuterol  3 mL Nebulization BID  . isosorbide mononitrate  60 mg Oral BID  . losartan  100 mg Oral Daily  . pantoprazole  40 mg Oral Daily  . polyethylene glycol  17 g Oral Daily  . primidone  50 mg Oral BID  . propranolol  20 mg Oral BID  . QUEtiapine  25 mg Oral QHS  . senna-docusate  2 tablet Oral BID  . tamsulosin  0.4 mg Oral Daily  . traZODone  100 mg Oral QHS  . vitamin B-12  1,000 mcg Oral Daily   Continuous Infusions:  Assessment/Plan:  1. Acute delirium.  Increase trazodone at night and give PRN Seroquel if needed.  Get rid of other medications that can cause delirium including gabapentin. 2. Acute hypoxic respiratory failure.  This has resolved and patient is on room air. 3. Sepsis ruled out and antibiotics for pneumonia stopped.  Patient on Keflex for removal of skin cancer to finish up the treatment course. 4. Accelerated hypertension.  On Norvasc, propranolol, hydrochlorothiazide, losartan. 5. Elevated troponin secondary to demand ischemia 6. Generalized weakness.  MRI of the brain negative for acute abnormalities but does show old strokes and severe chronic ischemic microangiopathy 7. Type 2 diabetes mellitus.  Will decrease Lantus dose and continue sliding scale 8. Acute kidney injury on chronic kidney disease stage III.  Code Status:     Code Status Orders  (From admission, onward)         Start     Ordered   04/14/18 0528  Full code  Continuous     04/14/18 0527        Code Status History    Date Active Date Inactive Code Status Order ID Comments User Context   12/15/2016 0002 12/18/2016 2007 Full Code [id number]  Lance Coon, MD Inpatient   09/24/2015 0540 09/24/2015 1648 Full Code [id number]  Harrie Foreman, MD Inpatient   04/05/2015 1036 04/07/2015 1654 Full Code [id number]  Fritzi Mandes, MD Inpatient   11/25/2014 0723  11/26/2014 2101 Full Code [id number]  Hillary Bow, MD ED     Family Communication: Spoke with wife on the phone Disposition Plan: To be determined  Time spent: 28 minutes  [place]

## 2018-04-18 NOTE — Clinical Social Work Note (Signed)
CSW received phone call from [place] that patient has been approved for SNF by insurance company.  CSW updated physician, patient can go to [facility] once he is medically ready for discharge and orders have been received.  Jones Broom. Norval Morton, MSW, LCSW [phone]  04/18/2018 7:32 PM

## 2018-04-19 ENCOUNTER — Inpatient Hospital Stay: Payer: Medicare Other

## 2018-04-19 LAB — ECHOCARDIOGRAM COMPLETE
Height: 68 in
Weight: 2680 oz

## 2018-04-19 LAB — GLUCOSE, CAPILLARY
GLUCOSE-CAPILLARY: 139 mg/dL — AB (ref 70–99)
Glucose-Capillary: 105 mg/dL — ABNORMAL HIGH (ref 70–99)
Glucose-Capillary: 207 mg/dL — ABNORMAL HIGH (ref 70–99)

## 2018-04-19 LAB — CBC
HEMATOCRIT: 40.3 % (ref 39.0–52.0)
Hemoglobin: 13.8 g/dL (ref 13.0–17.0)
MCH: 31.4 pg (ref 26.0–34.0)
MCHC: 34.2 g/dL (ref 30.0–36.0)
MCV: 91.8 fL (ref 80.0–100.0)
Platelets: 202 10*3/uL (ref 150–400)
RBC: 4.39 MIL/uL (ref 4.22–5.81)
RDW: 13.5 % (ref 11.5–15.5)
WBC: 8.2 10*3/uL (ref 4.0–10.5)
nRBC: 0 % (ref 0.0–0.2)

## 2018-04-19 MED ORDER — LACTULOSE 10 GM/15ML PO SOLN: 30.0000 g | Freq: Two times a day (BID) | ORAL | 0 refills | Status: DC | PRN

## 2018-04-19 MED ORDER — ISOSORBIDE MONONITRATE ER 60 MG PO TB24: 60.0000 mg | ORAL_TABLET | Freq: Every day | ORAL | Status: DC

## 2018-04-19 MED ORDER — ASPIRIN 81 MG PO CHEW: 81.0000 mg | CHEWABLE_TABLET | Freq: Every day | ORAL | 0 refills | Status: DC

## 2018-04-19 MED ORDER — LACTULOSE 10 GM/15ML PO SOLN: 30.0000 g | Freq: Two times a day (BID) | ORAL | Status: DC | PRN

## 2018-04-19 MED ORDER — HYDROCHLOROTHIAZIDE 25 MG PO TABS: 25.0000 mg | ORAL_TABLET | Freq: Every day | ORAL | 0 refills | Status: DC

## 2018-04-19 MED ORDER — INSULIN GLARGINE 100 UNIT/ML ~~LOC~~ SOLN: 7.0000 [IU] | Freq: Every day | SUBCUTANEOUS | 11 refills | Status: DC

## 2018-04-19 MED ORDER — WHITE PETROLATUM EX OINT: 1.0000 "application " | TOPICAL_OINTMENT | CUTANEOUS | 0 refills | Status: DC | PRN

## 2018-04-19 MED ORDER — IPRATROPIUM-ALBUTEROL 0.5-2.5 (3) MG/3ML IN SOLN: 3.0000 mL | Freq: Four times a day (QID) | RESPIRATORY_TRACT | Status: DC | PRN

## 2018-04-19 MED ORDER — POLYETHYLENE GLYCOL 3350 17 G PO PACK: 17.0000 g | PACK | Freq: Every day | ORAL | 0 refills | Status: DC

## 2018-04-19 MED ORDER — INSULIN ASPART 100 UNIT/ML ~~LOC~~ SOLN: 3.0000 [IU] | Freq: Three times a day (TID) | SUBCUTANEOUS | 11 refills | Status: DC

## 2018-04-19 MED ORDER — TRAZODONE HCL 100 MG PO TABS: 100.0000 mg | ORAL_TABLET | Freq: Every day | ORAL | 0 refills | Status: AC

## 2018-04-19 MED ORDER — DOCUSATE SODIUM 100 MG PO CAPS: 100.0000 mg | ORAL_CAPSULE | Freq: Two times a day (BID) | ORAL | 0 refills | Status: AC

## 2018-04-19 MED ORDER — FLEET ENEMA 7-19 GM/118ML RE ENEM
1.0000 | ENEMA | Freq: Once | RECTAL | Status: AC
  Administered 2018-04-19: 1 via RECTAL

## 2018-04-19 NOTE — Progress Notes (Signed)
Dressings changed prior to discharge.  Both wounds clean and healing well. Report called to [facility].  Will call EMS now.

## 2018-04-19 NOTE — Plan of Care (Signed)
  Problem: Elimination: Goal: Will not experience complications related to urinary retention Outcome: Not Progressing

## 2018-04-19 NOTE — Care Management Important Message (Signed)
Copy of signed Medicare IM left with patient in room. 

## 2018-04-19 NOTE — Clinical Social Work Note (Signed)
Patient to be d/c'ed today to [facility] room A14 bed 1.  Patient and family agreeable to plans will transport via ems RN to call report [phone].  Patient's wife was at bedside, and is aware of discharging today.  Evette Cristal, MSW, LCSW [phone]

## 2018-04-19 NOTE — Progress Notes (Signed)
Discharged to [facility] via EMS. Report was called to [facility] over an hour ago.  They are expecting him.

## 2018-04-19 NOTE — Discharge Summary (Signed)
[facility] [place] NAME: Richard Chapman    MR#:  [id number]  DATE OF BIRTH:  13-Aug-1940  DATE OF ADMISSION:  04/13/2018 ADMITTING PHYSICIAN: Arta Silence, MD  DATE OF DISCHARGE: 04/19/2018  PRIMARY CARE PHYSICIAN: Dr. Harrel Lemon   ADMISSION DIAGNOSIS:  Chest pain, unspecified type [R07.9] Community acquired pneumonia, unspecified laterality [J18.9]  DISCHARGE DIAGNOSIS:  Acute delirium  SECONDARY DIAGNOSIS:   Past Medical History:  Diagnosis Date  . Anxiety and depression   . Depression   . Diabetes ([place])   . Elevated PSA   . GERD (gastroesophageal reflux disease)   . History of nephrolithiasis   . Hypertension   . Skin cancer   . Sleep apnea     HOSPITAL COURSE:   1.  Acute delirium and altered mental status with hallucinations.  The patient did not sleep for 3 nights as per the wife.  I increased the patient's trazodone to 100 mg nightly.  I stopped gabapentin.  Mental status much improved today. 2.  Acute hypoxic respiratory failure.  This has resolved and patient is on room air. 3.  Initially thought to have sepsis but this was ruled out.  Repeat chest x-ray did not show pneumonia.  The patient finished up his Keflex for removal skin cancer. 4.  Accelerated hypertension probably worse with agitation.  Patient on Norvasc propanolol hydrochlorothiazide and losartan. 5.  Elevated troponin secondary to demand ischemia. 6.  Generalized weakness.  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  Patient is on aspirin and Plavix for stroke prevention 7.  Type 2 diabetes mellitus.  I decreased Lantus dose to 7 units nightly with short acting insulin prior to meals.  We will continue to hold metformin. 8.  Acute kidney injury on chronic kidney disease stage III.  Creatinine down to 1.45 upon discharge. 9.  Right leg pain.  Ultrasound negative for DVT.  We  will get a femur x-ray.  Patient able to straight leg raise so I know this is not a hip fracture.  Likely leg pain is secondary to fall.  Okay to do physical therapy. 10.  Constipation.  We will give lactulose as needed for severe constipation we will give a Fleet enema now.  Continue MiraLAX and Colace.  DISCHARGE CONDITIONS:   Satisfactory   DRUG ALLERGIES:   Allergies  Allergen Reactions  . Lisinopril Other (See Comments)    DISCHARGE MEDICATIONS:   Allergies as of 04/19/2018      Reactions   Lisinopril Other (See Comments)      Medication List    STOP taking these medications   cephALEXin 500 MG capsule Commonly known as:  KEFLEX   gabapentin 300 MG capsule Commonly known as:  NEURONTIN   hydrochlorothiazide 12.5 MG capsule Commonly known as:  MICROZIDE Replaced by:  hydrochlorothiazide 25 MG tablet   metFORMIN 1000 MG tablet Commonly known as:  GLUCOPHAGE   Tresiba FlexTouch 100 UNIT/ML Sopn FlexTouch Pen Generic drug:  insulin degludec Replaced by:  insulin glargine 100 UNIT/ML injection     TAKE these medications   acetaminophen 325 MG tablet Commonly known as:  TYLENOL Take 2 tablets (650 mg total) every 6 (six) hours as needed by mouth for mild pain (or Fever >/= 101).   amLODipine 10 MG tablet Commonly known as:  NORVASC Take 1 tablet (10 mg total) by mouth daily.   aspirin 81 MG chewable  tablet Chew 1 tablet (81 mg total) by mouth daily. Start taking on:  April 20, 2018   atorvastatin 10 MG tablet Commonly known as:  LIPITOR Take 5 mg by mouth daily.   cetirizine 10 MG tablet Commonly known as:  ZYRTEC Take 10 mg by mouth daily.   cholecalciferol 10 MCG (400 UNIT) Tabs tablet Commonly known as:  VITAMIN D3 Take 4,000 Units by mouth daily.   clopidogrel 75 MG tablet Commonly known as:  PLAVIX Take 1 tablet (75 mg total) by mouth daily.   docusate sodium 100 MG capsule Commonly known as:  COLACE Take 1 capsule (100 mg total) by mouth 2  (two) times daily. What changed:    when to take this  reasons to take this   escitalopram 20 MG tablet Commonly known as:  LEXAPRO Take 1 tablet (20 mg total) by mouth daily. What changed:  when to take this   esomeprazole 20 MG capsule Commonly known as:  NEXIUM Take 1 capsule (20 mg total) by mouth AC breakfast.   hydrochlorothiazide 25 MG tablet Commonly known as:  HYDRODIURIL Take 1 tablet (25 mg total) by mouth daily. Start taking on:  April 20, 2018 Replaces:  hydrochlorothiazide 12.5 MG capsule   hyoscyamine 0.375 MG 12 hr tablet Commonly known as:  LEVBID Take 0.375 mg by mouth every 12 (twelve) hours.   insulin aspart 100 UNIT/ML injection Commonly known as:  novoLOG Inject 3 Units into the skin 3 (three) times daily with meals.   insulin glargine 100 UNIT/ML injection Commonly known as:  LANTUS Inject 0.07 mLs (7 Units total) into the skin at bedtime. Replaces:  Tyler Aas FlexTouch 100 UNIT/ML Sopn FlexTouch Pen   isosorbide mononitrate 60 MG 24 hr tablet Commonly known as:  IMDUR Take 1 tablet (60 mg total) by mouth daily.   lactulose 10 GM/15ML solution Commonly known as:  CHRONULAC Take 45 mLs (30 g total) by mouth 2 (two) times daily as needed for severe constipation.   losartan 100 MG tablet Commonly known as:  COZAAR Take 100 mg by mouth daily.   magnesium oxide 400 MG tablet Commonly known as:  MAG-OX Take 400 mg by mouth daily.   nitroGLYCERIN 0.4 MG SL tablet Commonly known as:  NITROSTAT Place 0.4 mg under the tongue every 5 (five) minutes as needed for chest pain.   polyethylene glycol packet Commonly known as:  MIRALAX / GLYCOLAX Take 17 g by mouth daily. Start taking on:  April 20, 2018   primidone 50 MG tablet Commonly known as:  MYSOLINE Take 1 tablet by mouth 2 (two) times daily.   propranolol 20 MG tablet Commonly known as:  INDERAL Take 20 mg by mouth 2 (two) times daily.   ranitidine 150 MG capsule Commonly known as:   ZANTAC Take 150 mg by mouth 2 (two) times daily.   tamsulosin 0.4 MG Caps capsule Commonly known as:  FLOMAX Take 0.4 mg by mouth daily.   traZODone 100 MG tablet Commonly known as:  DESYREL Take 1 tablet (100 mg total) by mouth at bedtime.   vitamin B-12 1000 MCG tablet Commonly known as:  CYANOCOBALAMIN Take 1,000 mcg by mouth daily.   white petrolatum Oint Commonly known as:  VASELINE Apply 1 application topically as needed (Wound care).        DISCHARGE INSTRUCTIONS:   Follow-up Dr. at rehab  If you experience worsening of your admission symptoms, develop shortness of breath, life threatening emergency, suicidal or homicidal thoughts you  must seek medical attention immediately by calling 911 or calling your MD immediately  if symptoms less severe.  You Must read complete instructions/literature along with all the possible adverse reactions/side effects for all the Medicines you take and that have been prescribed to you. Take any new Medicines after you have completely understood and accept all the possible adverse reactions/side effects.   Please note  You were cared for by a hospitalist during your hospital stay. If you have any questions about your discharge medications or the care you received while you were in the hospital after you are discharged, you can call the unit and asked to speak with the hospitalist on call if the hospitalist that took care of you is not available. Once you are discharged, your primary care physician will handle any further medical issues. Please note that NO REFILLS for any discharge medications will be authorized once you are discharged, as it is imperative that you return to your primary care physician (or establish a relationship with a primary care physician if you do not have one) for your aftercare needs so that they can reassess your need for medications and monitor your lab values.    Today   CHIEF COMPLAINT:   Chief Complaint   Patient presents with  . Chest Pain    HISTORY OF PRESENT ILLNESS:  Richard Chapman  is a 78 y.o. male was admitted with suspicion of sepsis.  Sepsis was ruled out.   VITAL SIGNS:  Blood pressure (!) 141/62, pulse 74, temperature 97.7 F (36.5 C), temperature source Oral, resp. rate 19, height 5\' 8"  (1.727 m), weight 75.7 kg, SpO2 97 %.   PHYSICAL EXAMINATION:  GENERAL:  78 y.o.-year-old patient lying in the bed with no acute distress.  EYES: Pupils equal, round, reactive to light and accommodation. No scleral icterus. Extraocular muscles intact.  HEENT: Head atraumatic, normocephalic. Oropharynx and nasopharynx clear.  NECK:  Supple, no jugular venous distention. No thyroid enlargement, no tenderness.  LUNGS: Normal breath sounds bilaterally, no wheezing, rales,rhonchi or crepitation. No use of accessory muscles of respiration.  CARDIOVASCULAR: S1, S2 normal. No murmurs, rubs, or gallops.  ABDOMEN: Soft, non-tender, non-distended. Bowel sounds present. No organomegaly or mass.  EXTREMITIES: No pedal edema, cyanosis, or clubbing.  Good range of motion right knee and right hip. NEUROLOGIC: Cranial nerves II through XII are intact. Muscle strength 5/5 in all extremities. Sensation intact. Gait not checked.  PSYCHIATRIC: The patient is alert and oriented x 3.  SKIN: No obvious rash, lesion, or ulcer.   DATA REVIEW:   CBC Recent Labs  Lab 04/19/18 0808  WBC 8.2  HGB 13.8  HCT 40.3  PLT 202    Chemistries  Recent Labs  Lab 04/13/18 2158  04/16/18 0546  NA 136   < > 133*  K 4.1   < > 3.4*  CL 101   < > 98  CO2 25   < > 25  GLUCOSE 142*   < > 130*  BUN 28*   < > 35*  CREATININE 1.53*   < > 1.45*  CALCIUM 9.3   < > 9.0  MG 1.5*  --   --   AST 17  --   --   ALT 15  --   --   ALKPHOS 62  --   --   BILITOT 1.0  --   --    < > = values in this interval not displayed.    Cardiac Enzymes Recent Labs  Lab 04/14/18 1708  TROPONINI 0.03*    Microbiology Results   Results for orders placed or performed during the hospital encounter of 04/13/18  Blood culture (routine x 2)     Status: None   Collection Time: 04/13/18 11:39 PM  Result Value Ref Range Status   Specimen Description BLOOD RIGHT ANTECUBITAL  Final   Special Requests   Final    BOTTLES DRAWN AEROBIC AND ANAEROBIC Blood Culture results may not be optimal due to an excessive volume of blood received in culture bottles   Culture   Final    NO GROWTH 5 DAYS Performed at [facility], [address] [place], [place] [address]    Report Status 04/18/2018 FINAL  Final  Blood culture (routine x 2)     Status: None   Collection Time: 04/13/18 11:39 PM  Result Value Ref Range Status   Specimen Description BLOOD LEFT ANTECUBITAL  Final   Special Requests   Final    BOTTLES DRAWN AEROBIC AND ANAEROBIC Blood Culture results may not be optimal due to an excessive volume of blood received in culture bottles   Culture   Final    NO GROWTH 5 DAYS Performed at [facility], [address] [place], [place] [address]    Report Status 04/18/2018 FINAL  Final    RADIOLOGY:  US Venous Img Lower Bilateral  Result Date: 04/19/2018 CLINICAL DATA:  Lower extremity pain for 3 days EXAM: BILATERAL LOWER EXTREMITY VENOUS DUPLEX ULTRASOUND TECHNIQUE: Doppler venous assessment of the bilateral lower extremity deep venous system was performed, including characterization of spectral flow, compressibility, and phasicity. COMPARISON:  None. FINDINGS: There is complete compressibility of the bilateral common femoral, femoral, and popliteal veins. Doppler analysis demonstrates respiratory phasicity and augmentation of flow with calf compression. No obvious superficial vein or calf vein thrombosis. IMPRESSION: No evidence of lower extremity DVT. Electronically Signed   By: Marybelle Killings M.D.   On: 04/19/2018 09:50     Management plans discussed with the patient, family and they are in  agreement.  CODE STATUS:     Code Status Orders  (From admission, onward)         Start     Ordered   04/14/18 0528  Full code  Continuous     04/14/18 0527        Code Status History    Date Active Date Inactive Code Status Order ID Comments User Context   12/15/2016 0002 12/18/2016 2007 Full Code [id number]  Lance Coon, MD Inpatient   09/24/2015 0540 09/24/2015 1648 Full Code [id number]  Harrie Foreman, MD Inpatient   04/05/2015 1036 04/07/2015 1654 Full Code [id number]  Fritzi Mandes, MD Inpatient   11/25/2014 0723 11/26/2014 2101 Full Code [id number]  Hillary Bow, MD ED      TOTAL TIME TAKING CARE OF THIS PATIENT: 35 minutes.    Loletha Grayer M.D on 04/19/2018 at 11:11 AM  Between 7am to 6pm - Pager - [phone]  After 6pm go to [email or web address] - password [email or web address]  [facility] Office  [phone]  CC: Primary care physician; Dr

## 2018-04-19 NOTE — Clinical Social Work Placement (Signed)
   CLINICAL SOCIAL WORK PLACEMENT  NOTE  Date:  04/19/2018  Patient Details  Name: Richard Chapman MRN: [id number] Date of Birth: [birth year]  Clinical Social Work is seeking post-discharge placement for this patient at the [place] level of care (*CSW will initial, date and re-position this form in  chart as items are completed):  Yes   Patient/family provided with [place] Work Department's list of facilities offering this level of care within the geographic area requested by the patient (or if unable, by the patient's family).  Yes   Patient/family informed of their freedom to choose among providers that offer the needed level of care, that participate in Medicare, Medicaid or managed care program needed by the patient, have an available bed and are willing to accept the patient.  Yes   Patient/family informed of [facility]'s ownership interest in [facility] and [facility], as well as of the fact that they are under no obligation to receive care at these facilities.  PASRR submitted to EDS on 04/17/18     PASRR number received on       Existing PASRR number confirmed on 04/17/18     FL2 transmitted to all facilities in geographic area requested by pt/family on 04/17/18     FL2 transmitted to all facilities within larger geographic area on       Patient informed that his/her managed care company has contracts with or will negotiate with certain facilities, including the following:        Yes   Patient/family informed of bed offers received.  Patient chooses bed at [facility]     Physician recommends and patient chooses bed at      Patient to be transferred to [facility] on 04/19/18.  Patient to be transferred to facility by [facility]     Patient family notified on 04/19/18 of transfer.  Name of family member notified:  Patient's wife Curly Shores was at bedside and is aware that  patient is discharging today.     PHYSICIAN Please sign FL2, Please prepare prescriptions, Please prepare priority discharge summary, including medications     Additional Comment:    _______________________________________________ Ross Ludwig, LCSW 04/19/2018, 3:21 PM

## 2018-07-12 ENCOUNTER — Ambulatory Visit: Payer: Medicare Other | Admitting: Urology

## 2018-08-15 ENCOUNTER — Telehealth: Payer: Self-pay | Admitting: Urology

## 2018-08-15 DIAGNOSIS — C61 Malignant neoplasm of prostate: Secondary | ICD-10-CM

## 2018-08-15 NOTE — Telephone Encounter (Signed)
Patient has an app in [facility] on 08-23-18 but there are not any orders in for his labs and he is wanting to go and get them done.   Sharyn Lull

## 2018-08-15 NOTE — Telephone Encounter (Signed)
Orders have been put in. Tried calling patient and was unable to leave a message.

## 2018-08-19 ENCOUNTER — Other Ambulatory Visit
Admission: RE | Admit: 2018-08-19 | Discharge: 2018-08-19 | Disposition: A | Discharge status: Home / Self Care | Payer: Medicare Other | Attending: Urology | Admitting: Urology

## 2018-08-19 ENCOUNTER — Other Ambulatory Visit: Payer: Self-pay

## 2018-08-19 DIAGNOSIS — C61 Malignant neoplasm of prostate: Secondary | ICD-10-CM | POA: Diagnosis present

## 2018-08-19 LAB — PSA: Prostatic Specific Antigen: 12.15 ng/mL — ABNORMAL HIGH (ref 0.00–4.00)

## 2018-08-23 ENCOUNTER — Encounter: Payer: Self-pay | Admitting: Urology

## 2018-08-23 ENCOUNTER — Other Ambulatory Visit: Payer: Self-pay

## 2018-08-23 ENCOUNTER — Ambulatory Visit (INDEPENDENT_AMBULATORY_CARE_PROVIDER_SITE_OTHER): Payer: Medicare Other | Admitting: Urology

## 2018-08-23 VITALS — BP 145/72 | HR 62 | Ht 68.0 in | Wt 174.0 lb

## 2018-08-23 DIAGNOSIS — R32 Unspecified urinary incontinence: Secondary | ICD-10-CM | POA: Diagnosis not present

## 2018-08-23 DIAGNOSIS — C61 Malignant neoplasm of prostate: Secondary | ICD-10-CM

## 2018-08-23 DIAGNOSIS — N3281 Overactive bladder: Secondary | ICD-10-CM

## 2018-08-23 LAB — BLADDER SCAN AMB NON-IMAGING

## 2018-08-23 NOTE — Progress Notes (Signed)
08/23/2018 3:51 PM   Richard Chapman 09-Apr-1940 [id number]  Referring provider: Leonel Ramsay, MD [address],  [place] [address]  Chief Complaint  Patient presents with  . Prostate Cancer    1 year    HPI: 78 year old male who returns today for follow-up for multiple GU issues including history of prostate cancer and OAB.  He was last seen a little over a year ago for the same issues.  Since last year, he had a hand graft procedure for skin cancer which ultimately ended up being complicated by sepsis requiring a prolonged hospital admission with delirium.  He was admitted to [facility] rehab for some time and only recently has returned home.  Prostate cancer Elevated PSA to 9.1 dx 09/2014 with Gleason 3+3 in 2 cores involving the right apex, up to 23% of tissue. Rectal exam was benign. TRUS volume 60 cc.   MRI performed on 02/22/2016 shows no evidence of obvious high-grade lesions. He does have nodular transition zone. Prostatic capsule is intact.. He does have some subclinical lymph nodes in the bilateral iliac measuring 6 mm. Gland size 66 cc.   PSA trend below.    Most recent PSA 12.15 which is stable from 2018.   OAB/ urge incontinence/ enuresis He does have urinary frequency, urgency, and nocturia which is fairly significant. He now wear depends for both urge incontinence as well as fecal incontinence. He failed Mybetriq 25 mg, Vesicare 5 mg and most recently tried Mybetriq 50 mg daily which did not help enough.   He reports today that he is cut back on beer, in fact has not had one in over 3 weeks.  He is also drinking less soda coffee and tea and try to drink more water.  This is a change from his previous behaviors.  He and his wife note today that he has been prescribed a sleeping pill at nighttime and sleeps so soundly that he does not wake up and has episodes of enuresis.  This actually going on for quite some time, years.  He also  has daytime frequency but no urge incontinence during the day.  ED He also has severe baseline ED.   PMH: Past Medical History:  Diagnosis Date  . Anxiety and depression   . Depression   . Diabetes ([place])   . Elevated PSA   . GERD (gastroesophageal reflux disease)   . History of nephrolithiasis   . Hypertension   . Skin cancer   . Sleep apnea     Surgical History: Past Surgical History:  Procedure Laterality Date  . APPENDECTOMY    . BACK SURGERY    . BURR HOLE FOR SUBDURAL HEMATOMA     2015  . KNEE SURGERY Bilateral   . NECK SURGERY    . SHOULDER SURGERY    . TONSILLECTOMY    . WRIST SURGERY      Home Medications:  Allergies as of 08/23/2018      Reactions   Lisinopril Other (See Comments)      Medication List       Accurate as of August 23, 2018  3:51 PM. If you have any questions, ask your nurse or doctor.        STOP taking these medications   aspirin 81 MG chewable tablet Stopped by: Hollice Espy, MD   hydrochlorothiazide 25 MG tablet Commonly known as: HYDRODIURIL Stopped by: Hollice Espy, MD   insulin aspart 100 UNIT/ML injection Commonly known as:  novoLOG Stopped by: Hollice Espy, MD   insulin glargine 100 UNIT/ML injection Commonly known as: LANTUS Stopped by: Hollice Espy, MD   lactulose 10 GM/15ML solution Commonly known as: CHRONULAC Stopped by: Hollice Espy, MD   polyethylene glycol 17 g packet Commonly known as: MIRALAX / GLYCOLAX Stopped by: Hollice Espy, MD   ranitidine 150 MG capsule Commonly known as: ZANTAC Stopped by: Hollice Espy, MD     TAKE these medications   acetaminophen 325 MG tablet Commonly known as: TYLENOL Take 2 tablets (650 mg total) every 6 (six) hours as needed by mouth for mild pain (or Fever >/= 101).   amLODipine 10 MG tablet Commonly known as: NORVASC Take 1 tablet (10 mg total) by mouth daily.   atorvastatin 10 MG tablet Commonly known as: LIPITOR Take 5 mg by mouth daily.    cetirizine 10 MG tablet Commonly known as: ZYRTEC Take 10 mg by mouth daily.   cholecalciferol 10 MCG (400 UNIT) Tabs tablet Commonly known as: VITAMIN D3 Take 4,000 Units by mouth daily.   clopidogrel 75 MG tablet Commonly known as: PLAVIX Take 1 tablet (75 mg total) by mouth daily.   docusate sodium 100 MG capsule Commonly known as: COLACE Take 1 capsule (100 mg total) by mouth 2 (two) times daily.   escitalopram 20 MG tablet Commonly known as: LEXAPRO Take 1 tablet (20 mg total) by mouth daily. What changed: when to take this   esomeprazole 20 MG capsule Commonly known as: NEXIUM Take 1 capsule (20 mg total) by mouth AC breakfast.   hyoscyamine 0.375 MG 12 hr tablet Commonly known as: LEVBID Take 0.375 mg by mouth every 12 (twelve) hours.   insulin degludec 100 UNIT/ML Sopn FlexTouch Pen Commonly known as: TRESIBA Inject into the skin.   isosorbide mononitrate 60 MG 24 hr tablet Commonly known as: IMDUR Take 1 tablet (60 mg total) by mouth daily.   losartan 100 MG tablet Commonly known as: COZAAR Take 100 mg by mouth daily.   magnesium oxide 400 MG tablet Commonly known as: MAG-OX Take 400 mg by mouth daily.   nitroGLYCERIN 0.4 MG SL tablet Commonly known as: NITROSTAT Place 0.4 mg under the tongue every 5 (five) minutes as needed for chest pain.   primidone 50 MG tablet Commonly known as: MYSOLINE Take 1 tablet by mouth 2 (two) times daily.   propranolol 20 MG tablet Commonly known as: INDERAL Take 20 mg by mouth 2 (two) times daily.   tamsulosin 0.4 MG Caps capsule Commonly known as: FLOMAX Take 0.4 mg by mouth daily.   traZODone 100 MG tablet Commonly known as: DESYREL Take 1 tablet (100 mg total) by mouth at bedtime.   vitamin B-12 1000 MCG tablet Commonly known as: CYANOCOBALAMIN Take 1,000 mcg by mouth daily.   white petrolatum Oint Commonly known as: VASELINE Apply 1 application topically as needed (Wound care).       Allergies:   Allergies  Allergen Reactions  . Lisinopril Other (See Comments)    Family History: Family History  Problem Relation Age of Onset  . Heart disease Mother   . Heart disease Father   . Diabetes Father   . Stroke Father   . Kidney disease Neg Hx   . Prostate cancer Neg Hx     Social History:  reports that he has been smoking cigarettes. He has been smoking about 1.00 pack per day. He has never used smokeless tobacco. He reports current alcohol use of about 2.0 standard  drinks of alcohol per week. He reports that he does not use drugs.  ROS: UROLOGY Frequent Urination?: Yes Hard to postpone urination?: Yes Burning/pain with urination?: No Get up at night to urinate?: Yes Leakage of urine?: No Urine stream starts and stops?: Yes Trouble starting stream?: No Do you have to strain to urinate?: No Blood in urine?: No Urinary tract infection?: No Sexually transmitted disease?: No Injury to kidneys or bladder?: No Painful intercourse?: No Weak stream?: No Erection problems?: Yes Penile pain?: No  Gastrointestinal Nausea?: No Vomiting?: No Indigestion/heartburn?: No Diarrhea?: No Constipation?: No  Constitutional Fever: No Night sweats?: No Weight loss?: No Fatigue?: No  Skin Skin rash/lesions?: No Itching?: No  Eyes Blurred vision?: No Double vision?: No  Ears/Nose/Throat Sore throat?: No Sinus problems?: Yes  Hematologic/Lymphatic Swollen glands?: No Easy bruising?: Yes  Cardiovascular Leg swelling?: No Chest pain?: No  Respiratory Cough?: No Shortness of breath?: No  Endocrine Excessive thirst?: No  Musculoskeletal Back pain?: No Joint pain?: No  Neurological Headaches?: No Dizziness?: No  Psychologic Depression?: No Anxiety?: No  Physical Exam: BP (!) 145/72   Pulse 62   Ht 5\' 8"  (1.727 m)   Wt 174 lb (78.9 kg)   BMI 26.46 kg/m   Constitutional:  Alert and oriented, No acute distress.  In wheelchair accompanied by wife.    HEENT: [place] AT, moist mucus membranes.  Trachea midline, no masses.  Heard of hearing. Cardiovascular: No clubbing, cyanosis, or edema. Respiratory: Normal respiratory effort, no increased work of breathing. Skin: No rashes, bruises or suspicious lesions. Neurologic: Grossly intact, no focal deficits, moving all 4 extremities. Psychiatric: Normal mood and affect.  Laboratory Data: Lab Results  Component Value Date   WBC 8.2 04/19/2018   HGB 13.8 04/19/2018   HCT 40.3 04/19/2018   MCV 91.8 04/19/2018   PLT 202 04/19/2018    Lab Results  Component Value Date   CREATININE 1.45 (H) 04/16/2018    Component     Latest Ref Rng & Units 01/09/2017 07/12/2017 08/19/2018  Prostatic Specific Antigen     0.00 - 4.00 ng/mL 11.98 (H) 10.78 (H) 12.15 (H)    Lab Results  Component Value Date   HGBA1C 7.4 (H) 09/24/2015     Pertinent Imaging: Results for orders placed during the hospital encounter of 04/13/18  US RENAL   Narrative CLINICAL DATA:  Acute kidney injury.  EXAM: RENAL / URINARY TRACT ULTRASOUND COMPLETE  COMPARISON:  None.  FINDINGS: Right Kidney:  Renal measurements: 10.1 x 5.0 x 4.5 cm = volume: 118 mL . Echogenicity within normal limits. No mass or hydronephrosis visualized.  Left Kidney:  Renal measurements: 9.9 x 5.4 x 4.6 cm = volume: 129 mL. Normal parenchymal echogenicity. Exophytic 2 cm cyst arises from the upper pole. No other masses, no stones and no hydronephrosis.  Bladder:  Appears normal for degree of bladder distention.  IMPRESSION: 1. No acute findings.  No hydronephrosis. 2. 2 cm upper pole left renal cyst.  No other abnormalities.   Electronically Signed   By: Lajean Manes M.D.   On: 04/16/2018 10:02     Results for orders placed or performed in visit on 08/23/18  BLADDER SCAN AMB NON-IMAGING  Result Value Ref Range   Scan Result 48ml      Assessment & Plan:    1. Prostate cancer ([place]) Low risk prostate cancer PSA is  essentially stable Given his multiple medical problems, we will continue to follow him conservatively with annual PSA Patient  is agreeable this plan  2. OAB (overactive Bladder) Failed oral pharmacotherapy We discussed second line treatment today in the form of PTNS versus Botox Each of these were discussed at length, may be more interested in PTNS down the road Currently, he does not want to pursue any intervention No evidence of overflow incontinence or incomplete bladder emptying No signs or symptoms of UTI as a contributing factor - BLADDER SCAN AMB NON-IMAGING  3. Enuresis As above Discussed behavioral modification with avoiding fluids several hours before bedtime   Return in about 1 year (around 08/23/2019) for PSA.  Hollice Espy, MD  [facility] [address], [place], [place] [address] [phone]  I spent 25 min with this patient of which greater than 50% was spent in counseling and coordination of care with the patient.

## 2018-08-23 NOTE — Addendum Note (Signed)
Addended by: Tommy Rainwater on: 08/23/2018 03:52 PM   Modules accepted: Orders

## 2019-04-14 ENCOUNTER — Other Ambulatory Visit: Payer: Self-pay | Admitting: Infectious Diseases

## 2019-04-14 ENCOUNTER — Other Ambulatory Visit (HOSPITAL_COMMUNITY): Payer: Self-pay | Admitting: Infectious Diseases

## 2019-04-14 DIAGNOSIS — R911 Solitary pulmonary nodule: Secondary | ICD-10-CM

## 2019-04-15 ENCOUNTER — Encounter (INDEPENDENT_AMBULATORY_CARE_PROVIDER_SITE_OTHER): Payer: Self-pay

## 2019-04-15 ENCOUNTER — Other Ambulatory Visit: Payer: Self-pay

## 2019-04-15 ENCOUNTER — Ambulatory Visit
Admission: RE | Admit: 2019-04-15 | Discharge: 2019-04-15 | Disposition: A | Discharge status: Home / Self Care | Payer: Medicare Other | Source: Ambulatory Visit | Attending: Infectious Diseases | Admitting: Infectious Diseases

## 2019-04-15 DIAGNOSIS — R911 Solitary pulmonary nodule: Secondary | ICD-10-CM

## 2019-08-29 ENCOUNTER — Ambulatory Visit: Payer: Medicare Other | Admitting: Urology

## 2019-09-03 ENCOUNTER — Other Ambulatory Visit: Payer: Self-pay

## 2019-09-03 ENCOUNTER — Other Ambulatory Visit
Admission: RE | Admit: 2019-09-03 | Discharge: 2019-09-03 | Disposition: A | Discharge status: Home / Self Care | Payer: Medicare Other | Attending: Urology | Admitting: Urology

## 2019-09-03 DIAGNOSIS — C61 Malignant neoplasm of prostate: Secondary | ICD-10-CM

## 2019-09-03 LAB — PSA: Prostatic Specific Antigen: 15.83 ng/mL — ABNORMAL HIGH (ref 0.00–4.00)

## 2019-09-04 NOTE — Progress Notes (Signed)
09/05/2019 4:14 PM   Richard Chapman [birth year] [id number]  Referring provider: Baxter Hire, MD [place],  [place] [address] Chief Complaint  Patient presents with  . Prostate Cancer    follow up    HPI: Richard Chapman is a 79 y.o. male who returns today for follow-up for multiple GU issues including history of prostate cancer and OAB.  Recently had COVID-19 infection x 1 month ago despite being fully vaccinated. He had no onset symptoms.   Prostate cancer Elevated PSA to 9.1 dx 09/2014 with Gleason 3+3 in 2 cores involving the right apex, up to 23% of tissue. Rectal exam was benign. TRUS volume 60 cc.   MRI performed on 02/22/2016 shows no evidence of obvious high-grade lesions. He does have nodular transition zone. Prostatic capsule is intact.. He does have some subclinical lymph nodes in the bilateral iliac measuring 6 mm. Gland size 66 cc.   Recent PSA was 15.83 on 09/03/2019. PSA trend below.  Component     Latest Ref Rng & Units 01/09/2017 07/12/2017 08/19/2018 09/03/2019  Prostatic Specific Antigen     0.00 - 4.00 ng/mL 11.98 (H) 10.78 (H) 12.15 (H) 15.83 (H)    OAB/ urge incontinence/ enuresis He has persistent urinary frequency, urgency, and nocturia but is manageable. He now wear depends for both urge incontinence as well as fecal incontinence. He failed Mybetriq 25 mg, Vesicare 5 mg and most recently tried Mybetriq 50 mg daily which did not help enough.   Daytime frequency but otherwise doing ok today.  Minimal bother today.   PMH: Past Medical History:  Diagnosis Date  . Anxiety and depression   . Depression   . Diabetes ([place])   . Elevated PSA   . GERD (gastroesophageal reflux disease)   . History of nephrolithiasis   . Hypertension   . Skin cancer   . Sleep apnea     Surgical History: Past Surgical History:  Procedure Laterality Date  . APPENDECTOMY    . BACK SURGERY    . BURR HOLE FOR SUBDURAL HEMATOMA     2015   . KNEE SURGERY Bilateral   . NECK SURGERY    . SHOULDER SURGERY    . TONSILLECTOMY    . WRIST SURGERY      Home Medications:  Allergies as of 09/05/2019      Reactions   Lisinopril Other (See Comments)      Medication List       Accurate as of September 05, 2019  4:14 PM. If you have any questions, ask your nurse or doctor.        acetaminophen 325 MG tablet Commonly known as: TYLENOL Take 2 tablets (650 mg total) every 6 (six) hours as needed by mouth for mild pain (or Fever >/= 101).   amLODipine 10 MG tablet Commonly known as: NORVASC Take 1 tablet (10 mg total) by mouth daily.   atorvastatin 10 MG tablet Commonly known as: LIPITOR Take 5 mg by mouth daily.   cetirizine 10 MG tablet Commonly known as: ZYRTEC Take 10 mg by mouth daily.   cholecalciferol 10 MCG (400 UNIT) Tabs tablet Commonly known as: VITAMIN D3 Take 4,000 Units by mouth daily.   clopidogrel 75 MG tablet Commonly known as: PLAVIX Take 1 tablet (75 mg total) by mouth daily.   docusate sodium 100 MG capsule Commonly known as: COLACE Take 1 capsule (100 mg total) by mouth 2 (two) times daily.   escitalopram 20  MG tablet Commonly known as: LEXAPRO Take 1 tablet (20 mg total) by mouth daily. What changed: when to take this   esomeprazole 20 MG capsule Commonly known as: NEXIUM Take 1 capsule (20 mg total) by mouth AC breakfast.   hyoscyamine 0.375 MG 12 hr tablet Commonly known as: LEVBID Take 0.375 mg by mouth every 12 (twelve) hours.   insulin degludec 100 UNIT/ML FlexTouch Pen Commonly known as: TRESIBA Inject into the skin.   isosorbide mononitrate 60 MG 24 hr tablet Commonly known as: IMDUR Take 1 tablet (60 mg total) by mouth daily.   losartan 100 MG tablet Commonly known as: COZAAR Take 100 mg by mouth daily.   magnesium oxide 400 MG tablet Commonly known as: MAG-OX Take 400 mg by mouth daily.   nitroGLYCERIN 0.4 MG SL tablet Commonly known as: NITROSTAT Place 0.4 mg  under the tongue every 5 (five) minutes as needed for chest pain.   primidone 50 MG tablet Commonly known as: MYSOLINE Take 1 tablet by mouth 2 (two) times daily.   propranolol 20 MG tablet Commonly known as: INDERAL Take 20 mg by mouth 2 (two) times daily.   tamsulosin 0.4 MG Caps capsule Commonly known as: FLOMAX Take 0.4 mg by mouth daily.   traZODone 100 MG tablet Commonly known as: DESYREL Take 1 tablet (100 mg total) by mouth at bedtime.   vitamin B-12 1000 MCG tablet Commonly known as: CYANOCOBALAMIN Take 1,000 mcg by mouth daily.   white petrolatum Oint Commonly known as: VASELINE Apply 1 application topically as needed (Wound care).       Allergies:  Allergies  Allergen Reactions  . Lisinopril Other (See Comments)    Family History: Family History  Problem Relation Age of Onset  . Heart disease Mother   . Heart disease Father   . Diabetes Father   . Stroke Father   . Kidney disease Neg Hx   . Prostate cancer Neg Hx     Social History:  reports that he has been smoking cigarettes. He has been smoking about 1.00 pack per day. He has never used smokeless tobacco. He reports current alcohol use of about 2.0 standard drinks of alcohol per week. He reports that he does not use drugs.   Physical Exam: BP 121/66   Pulse 67   Ht 5\' 8"  (1.727 m)   Wt 158 lb (71.7 kg)   BMI 24.02 kg/m   Constitutional:  Alert and oriented, No acute distress. Accompanied by wife.  In wheel chair.   HEENT: [place] AT, moist mucus membranes.  Trachea midline, no masses. Cardiovascular: No clubbing, cyanosis, or edema. Respiratory: Normal respiratory effort, no increased work of breathing. Skin: No rashes, bruises or suspicious lesions. Neurologic: Grossly intact, no focal deficits, moving all 4 extremities. Psychiatric: Normal mood and affect.   Assessment & Plan:    1. Prostate cancer (HCC)/Rising PSA PSA is rising given his age and medical problems, will continue to follow  more conservatively Patient is not interested in a diagnostic work-up or treatment at this time. PSA will be rechecked in 6 months Will consider MRI or repeat biopsy if PSA does not improve.  2. OAB (overactive bladder) Persistent but manageable.   F/u 6 months with [facility] [address] [place], [place] [address] [phone]  I, Selena Batten, am acting as a scribe for Dr. Hollice Espy.  I have reviewed the above documentation for accuracy and completeness, and I agree with  the above.   Hollice Espy, MD

## 2019-09-05 ENCOUNTER — Ambulatory Visit (INDEPENDENT_AMBULATORY_CARE_PROVIDER_SITE_OTHER): Payer: Medicare Other | Admitting: Urology

## 2019-09-05 ENCOUNTER — Encounter: Payer: Self-pay | Admitting: Urology

## 2019-09-05 ENCOUNTER — Other Ambulatory Visit: Payer: Self-pay

## 2019-09-05 VITALS — BP 121/66 | HR 67 | Ht 68.0 in | Wt 158.0 lb

## 2019-09-05 DIAGNOSIS — C61 Malignant neoplasm of prostate: Secondary | ICD-10-CM

## 2019-09-05 DIAGNOSIS — N3281 Overactive bladder: Secondary | ICD-10-CM | POA: Diagnosis not present

## 2020-03-12 ENCOUNTER — Ambulatory Visit: Payer: Medicare Other | Admitting: Urology

## 2020-03-19 ENCOUNTER — Ambulatory Visit: Payer: Medicare Other | Admitting: Urology

## 2020-03-26 ENCOUNTER — Other Ambulatory Visit
Admission: RE | Admit: 2020-03-26 | Discharge: 2020-03-26 | Disposition: A | Discharge status: Home / Self Care | Payer: Medicare Other | Attending: Urology | Admitting: Urology

## 2020-03-26 ENCOUNTER — Telehealth: Payer: Self-pay

## 2020-03-26 DIAGNOSIS — C61 Malignant neoplasm of prostate: Secondary | ICD-10-CM | POA: Diagnosis present

## 2020-03-26 NOTE — Telephone Encounter (Signed)
PSA ordered

## 2020-03-27 LAB — PSA: Prostatic Specific Antigen: 16.48 ng/mL — ABNORMAL HIGH (ref 0.00–4.00)

## 2020-04-02 ENCOUNTER — Encounter: Payer: Self-pay | Admitting: Urology

## 2020-04-02 ENCOUNTER — Ambulatory Visit (INDEPENDENT_AMBULATORY_CARE_PROVIDER_SITE_OTHER): Payer: Medicare Other | Admitting: Urology

## 2020-04-02 ENCOUNTER — Other Ambulatory Visit: Payer: Self-pay

## 2020-04-02 VITALS — BP 135/68 | HR 72 | Ht 68.0 in | Wt 157.8 lb

## 2020-04-02 DIAGNOSIS — C61 Malignant neoplasm of prostate: Secondary | ICD-10-CM

## 2020-04-02 NOTE — Progress Notes (Signed)
04/02/2020 2:21 PM   Richard Chapman 25-Mar-1940 [id number]  Referring provider: Leonel Ramsay, MD [place],  [place] [address]  Chief Complaint  Patient presents with  . Follow-up    41mth PSA    HPI: Richard Chapman is a 80 y.o. male who returns today for follow-up for multiple GU issues including history of prostate cancer and OAB.  He is accompanied today by his wife again in a wheelchair.  Prostate cancer Elevated PSA to 9.1 dx 09/2014 with Gleason 3+3 in 2 cores involving the right apex, up to 23% of tissue. Rectal exam was benign. TRUS volume 60 cc.   MRI performed on 02/22/2016 shows no evidence of obvious high-grade lesions. He does have nodular transition zone. Prostatic capsule is intact.. He does have some subclinical lymph nodes in the bilateral iliac measuring 6 mm. Gland size 66 cc.   PSA was repeated prior to this visit.  Trend below.  Component     Latest Ref Rng & Units 01/09/2017 07/12/2017 08/19/2018 09/03/2019  Prostatic Specific Antigen     0.00 - 4.00 ng/mL 11.98 (H) 10.78 (H) 12.15 (H) 15.83 (H)   Component     Latest Ref Rng & Units 03/26/2020  Prostatic Specific Antigen     0.00 - 4.00 ng/mL 16.48 (H)    OAB/ urge incontinence/ enuresis He has persistent urinary frequency, urgency, and nocturia but is manageable. He now wear depends for both urge incontinence as well as fecal incontinence. He failed Mybetriq 25 mg, Vesicare 5 mg and most recently tried Mybetriq 50 mg daily which did not help enough.   Today, he reports he is doing actually quite well.  He reports that his urinary symptoms are better than they have ever been in quite some time.  He has no complaints.     PMH: Past Medical History:  Diagnosis Date  . Anxiety and depression   . Depression   . Diabetes ([place])   . Elevated PSA   . GERD (gastroesophageal reflux disease)   . History of nephrolithiasis   . Hypertension   . Skin cancer   . Sleep  apnea     Surgical History: Past Surgical History:  Procedure Laterality Date  . APPENDECTOMY    . BACK SURGERY    . BURR HOLE FOR SUBDURAL HEMATOMA     2015  . KNEE SURGERY Bilateral   . NECK SURGERY    . SHOULDER SURGERY    . TONSILLECTOMY    . WRIST SURGERY      Home Medications:  Allergies as of 04/02/2020      Reactions   Lisinopril Other (See Comments), Cough      Medication List       Accurate as of April 02, 2020  2:21 PM. If you have any questions, ask your nurse or doctor.        acetaminophen 325 MG tablet Commonly known as: TYLENOL Take 2 tablets (650 mg total) every 6 (six) hours as needed by mouth for mild pain (or Fever >/= 101).   amLODipine 10 MG tablet Commonly known as: NORVASC Take 1 tablet (10 mg total) by mouth daily.   atorvastatin 10 MG tablet Commonly known as: LIPITOR Take 5 mg by mouth daily.   cetirizine 10 MG tablet Commonly known as: ZYRTEC Take 10 mg by mouth daily.   Cholecalciferol 25 MCG (1000 UT) tablet Take 1 tablet by mouth daily. What changed: Another medication with the same  name was removed. Continue taking this medication, and follow the directions you see here. Changed by: Hollice Espy, MD   clopidogrel 75 MG tablet Commonly known as: PLAVIX Take 1 tablet (75 mg total) by mouth daily.   docusate sodium 100 MG capsule Commonly known as: COLACE Take 1 capsule (100 mg total) by mouth 2 (two) times daily.   escitalopram 20 MG tablet Commonly known as: LEXAPRO Take 1 tablet (20 mg total) by mouth daily. What changed: when to take this   esomeprazole 20 MG capsule Commonly known as: NEXIUM Take 1 capsule (20 mg total) by mouth AC breakfast.   famotidine 20 MG tablet Commonly known as: PEPCID Take 1 tablet by mouth at bedtime.   feeding supplement (GLUCERNA 1.2 CAL) Liqd Take by mouth.   gabapentin 300 MG capsule Commonly known as: NEURONTIN TAKE ONE CAPSULE BY MOUTH AT BEDTIME FOR NERVE PAIN    hyoscyamine 0.375 MG 12 hr tablet Commonly known as: LEVBID Take 0.375 mg by mouth every 12 (twelve) hours.   insulin degludec 100 UNIT/ML FlexTouch Pen Commonly known as: TRESIBA Inject into the skin.   isosorbide mononitrate 60 MG 24 hr tablet Commonly known as: IMDUR Take 1 tablet (60 mg total) by mouth daily.   losartan 100 MG tablet Commonly known as: COZAAR Take 100 mg by mouth daily.   magnesium oxide 400 MG tablet Commonly known as: MAG-OX Take 400 mg by mouth daily.   nitroGLYCERIN 0.4 MG SL tablet Commonly known as: NITROSTAT Place 0.4 mg under the tongue every 5 (five) minutes as needed for chest pain.   OneTouch Verio test strip Generic drug: glucose blood SMARTSIG:1 Each Via Meter Daily   primidone 50 MG tablet Commonly known as: MYSOLINE Take 1 tablet by mouth 2 (two) times daily.   propranolol 40 MG tablet Commonly known as: INDERAL TAKE ONE-HALF TABLET BY MOUTH TWO TIMES A DAY FOR BLOOD PRESSURE What changed: Another medication with the same name was removed. Continue taking this medication, and follow the directions you see here. Changed by: Hollice Espy, MD   tamsulosin 0.4 MG Caps capsule Commonly known as: FLOMAX Take 0.4 mg by mouth daily.   traZODone 100 MG tablet Commonly known as: DESYREL Take 1 tablet (100 mg total) by mouth at bedtime.   vitamin B-12 1000 MCG tablet Commonly known as: CYANOCOBALAMIN Take 1,000 mcg by mouth daily.   white petrolatum Oint Commonly known as: VASELINE Apply 1 application topically as needed (Wound care).       Allergies:  Allergies  Allergen Reactions  . Lisinopril Other (See Comments) and Cough    Family History: Family History  Problem Relation Age of Onset  . Heart disease Mother   . Heart disease Father   . Diabetes Father   . Stroke Father   . Kidney disease Neg Hx   . Prostate cancer Neg Hx     Social History:  reports that he has been smoking cigarettes. He has been smoking  about 1.00 pack per day. He has never used smokeless tobacco. He reports current alcohol use of about 2.0 standard drinks of alcohol per week. He reports that he does not use drugs.   Physical Exam: BP 135/68 (BP Location: Left Arm, Patient Position: Sitting, Cuff Size: Normal)   Pulse 72   Ht 5\' 8"  (1.727 m)   Wt 157 lb 12.8 oz (71.6 kg)   BMI 23.99 kg/m   Constitutional:  Alert and oriented, No acute distress. HEENT: [place] AT,  moist mucus membranes.  Trachea midline, no masses. Cardiovascular: No clubbing, cyanosis, or edema. Respiratory: Normal respiratory effort, no increased work of breathing. Rectal exam deferred, will likely not change intervention as outlined below Skin: No rashes, bruises or suspicious lesions. Neurologic: Grossly intact, no focal deficits, moving all 4 extremities. Psychiatric: Normal mood and affect.  Assessment & Plan:    1. Prostate cancer ([place]) PSA remains relatively stable and gentleman with multiple medical comorbidities and advanced age  He continues to want to manage conservatively with active surveillance  Plan to recheck a PSA along with rectal exam in 6 months, he and his wife are agreeable this plan - PSA; Future   Hollice Espy, MD  [facility] [address], [place], [place] [address] [phone]

## 2020-05-03 ENCOUNTER — Other Ambulatory Visit: Payer: Self-pay | Admitting: Infectious Diseases

## 2020-05-03 DIAGNOSIS — R918 Other nonspecific abnormal finding of lung field: Secondary | ICD-10-CM

## 2020-05-03 DIAGNOSIS — R9389 Abnormal findings on diagnostic imaging of other specified body structures: Secondary | ICD-10-CM

## 2020-05-12 ENCOUNTER — Ambulatory Visit
Admission: RE | Admit: 2020-05-12 | Discharge: 2020-05-12 | Disposition: A | Discharge status: Home / Self Care | Payer: Medicare Other | Source: Ambulatory Visit | Attending: Infectious Diseases | Admitting: Infectious Diseases

## 2020-05-12 ENCOUNTER — Other Ambulatory Visit: Payer: Self-pay

## 2020-05-12 DIAGNOSIS — R9389 Abnormal findings on diagnostic imaging of other specified body structures: Secondary | ICD-10-CM | POA: Insufficient documentation

## 2020-05-12 DIAGNOSIS — R918 Other nonspecific abnormal finding of lung field: Secondary | ICD-10-CM | POA: Diagnosis present

## 2020-09-29 ENCOUNTER — Other Ambulatory Visit
Admission: RE | Admit: 2020-09-29 | Discharge: 2020-09-29 | Disposition: A | Discharge status: Home / Self Care | Payer: Medicare Other | Attending: Urology | Admitting: Urology

## 2020-09-29 DIAGNOSIS — C61 Malignant neoplasm of prostate: Secondary | ICD-10-CM | POA: Insufficient documentation

## 2020-09-29 LAB — PSA: Prostatic Specific Antigen: 13.23 ng/mL — ABNORMAL HIGH (ref 0.00–4.00)

## 2020-10-01 ENCOUNTER — Encounter: Payer: Self-pay | Admitting: Urology

## 2020-10-01 ENCOUNTER — Other Ambulatory Visit: Payer: Self-pay

## 2020-10-01 ENCOUNTER — Ambulatory Visit (INDEPENDENT_AMBULATORY_CARE_PROVIDER_SITE_OTHER): Payer: Medicare Other | Admitting: Urology

## 2020-10-01 VITALS — BP 157/69 | HR 70 | Ht 68.0 in | Wt 169.0 lb

## 2020-10-01 DIAGNOSIS — C61 Malignant neoplasm of prostate: Secondary | ICD-10-CM | POA: Diagnosis not present

## 2020-10-01 DIAGNOSIS — N3281 Overactive bladder: Secondary | ICD-10-CM

## 2020-10-01 NOTE — Progress Notes (Signed)
10/01/2020 2:42 PM   Richard Chapman Aug 23, 1940 [id number]  Referring provider: Leonel Ramsay, MD [name],   [id number]  Chief Complaint  Patient presents with   Follow-up    61mh follw-up with PSA    HPI: 80year old male with a personal tree of low risk prostate cancer on active surveillance and OAB who returns today for 631-monthollow-up accompanied by his wife.  Prostate cancer  Elevated PSA to 9.1 dx 09/2014 with Gleason 3+3 in 2 cores involving the right apex, up to 23% of tissue.  Rectal exam was benign. TRUS volume 60 cc.      MRI performed on 02/22/2016 shows no evidence of obvious high-grade lesions.  He does have nodular transition zone.  Prostatic capsule is intact.. He does have some subclinical lymph nodes in the bilateral iliac measuring 6 mm.  Gland size 66 cc.     PSA was repeated prior to this visit.  Trend below.   Component     Latest Ref Rng & Units 01/09/2017 07/12/2017 08/19/2018 09/03/2019  Prostatic Specific Antigen     0.00 - 4.00 ng/mL 11.98 (H) 10.78 (H) 12.15 (H) 15.83 (H)   Component     Latest Ref Rng & Units 03/26/2020 09/29/2020  Prostatic Specific Antigen     0.00 - 4.00 ng/mL 16.48 (H) 13.23 (H)     OAB/ urge incontinence/ enuresis He has persistent urinary frequency, urgency, and nocturia but is manageable.  He now wear depends for both urge incontinence as well as fecal incontinence.  He failed Mybetriq 25 mg, Vesicare 5 mg and most recently tried Mybetriq 50 mg daily which did not help enough.    Continues to have urinary incontinence but is found a system to manage this, has a urinal by side during the day and wears diaper with double pads at night.  Overall, he is not bothered by this at this point and does not desire pharmacotherapy.   PMH: Past Medical History:  Diagnosis Date   Anxiety and depression    Depression    Diabetes (HCC)    Elevated PSA    GERD (gastroesophageal reflux disease)    History  of nephrolithiasis    Hypertension    Skin cancer    Sleep apnea     Surgical History: Past Surgical History:  Procedure Laterality Date   APPENDECTOMY     BACK SURGERY     BURR HOLE FOR SUBDURAL HEMATOMA     2015   KNEE SURGERY Bilateral    NECK SURGERY     SHOULDER SURGERY     TONSILLECTOMY     WRIST SURGERY      Home Medications:  Allergies as of 10/01/2020       Reactions   Lisinopril Other (See Comments), Cough        Medication List        Accurate as of October 01, 2020  2:42 PM. If you have any questions, ask your nurse or doctor.          acetaminophen 325 MG tablet Commonly known as: TYLENOL Take 2 tablets (650 mg total) every 6 (six) hours as needed by mouth for mild pain (or Fever >/= 101).   amLODipine 10 MG tablet Commonly known as: NORVASC Take 1 tablet (10 mg total) by mouth daily.   atorvastatin 10 MG tablet Commonly known as: LIPITOR Take 5 mg by mouth daily.   cetirizine 10 MG tablet Commonly known as: ZYRTEC  Take 10 mg by mouth daily.   Cholecalciferol 25 MCG (1000 UT) tablet Take 1 tablet by mouth daily.   clopidogrel 75 MG tablet Commonly known as: PLAVIX Take 1 tablet (75 mg total) by mouth daily.   docusate sodium 100 MG capsule Commonly known as: COLACE Take 1 capsule (100 mg total) by mouth 2 (two) times daily.   escitalopram 20 MG tablet Commonly known as: LEXAPRO Take 1 tablet (20 mg total) by mouth daily. What changed: when to take this   esomeprazole 20 MG capsule Commonly known as: NEXIUM Take 1 capsule (20 mg total) by mouth AC breakfast.   famotidine 20 MG tablet Commonly known as: PEPCID Take 1 tablet by mouth at bedtime.   feeding supplement (GLUCERNA 1.2 CAL) Liqd Take by mouth.   gabapentin 300 MG capsule Commonly known as: NEURONTIN TAKE ONE CAPSULE BY MOUTH AT BEDTIME FOR NERVE PAIN   hyoscyamine 0.375 MG 12 hr tablet Commonly known as: LEVBID Take 0.375 mg by mouth every 12 (twelve) hours.    insulin degludec 100 UNIT/ML FlexTouch Pen Commonly known as: TRESIBA Inject into the skin.   isosorbide mononitrate 60 MG 24 hr tablet Commonly known as: IMDUR Take 1 tablet (60 mg total) by mouth daily.   losartan 100 MG tablet Commonly known as: COZAAR Take 100 mg by mouth daily.   magnesium oxide 400 MG tablet Commonly known as: MAG-OX Take 400 mg by mouth daily.   nitroGLYCERIN 0.4 MG SL tablet Commonly known as: NITROSTAT Place 0.4 mg under the tongue every 5 (five) minutes as needed for chest pain.   OneTouch Verio test strip Generic drug: glucose blood SMARTSIG:1 Each Via Meter Daily   primidone 50 MG tablet Commonly known as: MYSOLINE Take 1 tablet by mouth 2 (two) times daily.   propranolol 40 MG tablet Commonly known as: INDERAL TAKE ONE-HALF TABLET BY MOUTH TWO TIMES A DAY FOR BLOOD PRESSURE   tamsulosin 0.4 MG Caps capsule Commonly known as: FLOMAX Take 0.4 mg by mouth daily.   traZODone 100 MG tablet Commonly known as: DESYREL Take 1 tablet (100 mg total) by mouth at bedtime.   vitamin B-12 1000 MCG tablet Commonly known as: CYANOCOBALAMIN Take 1,000 mcg by mouth daily.   white petrolatum Oint Commonly known as: VASELINE Apply 1 application topically as needed (Wound care).        Allergies:  Allergies  Allergen Reactions   Lisinopril Other (See Comments) and Cough    Family History: Family History  Problem Relation Age of Onset   Heart disease Mother    Heart disease Father    Diabetes Father    Stroke Father    Kidney disease Neg Hx    Prostate cancer Neg Hx     Social History:  reports that he has been smoking cigarettes. He has been smoking an average of 1 pack per day. He has never used smokeless tobacco. He reports current alcohol use of about 2.0 standard drinks per week. He reports that he does not use drugs.   Physical Exam: BP (!) 157/69   Pulse 70   Ht '5\' 8"'$  (1.727 m)   Wt 169 lb (76.7 kg)   BMI 25.70 kg/m    Constitutional:  Alert and oriented, No acute distress.  Hard of hearing.  Accompanied by wife today in wheelchair. Rectal: Deferred given this is likely not to change intervention. Skin: No rashes, bruises or suspicious lesions. Neurologic: Grossly intact, no focal deficits, moving all 4 extremities.  Psychiatric: Normal mood and affect.  Assessment & Plan:    1. Prostate cancer ([place]) Low risk prostate cancer on active surveillance  PSA is actually gone down back towards his baseline by 3 points which is very reassuring  Given his age and medical comorbidities, strong preference for continued surveillance, have deferred rectal exam moving forward unless his PSA starts to rise precipitously as it will not likely change management  Will plan for PSA only in 6 months and then a PSA with MD visit in a year. - PSA; Standing  2. OAB (overactive bladder) Stable, does not desire intervention  As above  Hollice Espy, MD  [facility] [address] [place], [place] [address] [phone]

## 2020-12-18 ENCOUNTER — Other Ambulatory Visit: Payer: Self-pay

## 2020-12-18 ENCOUNTER — Encounter: Payer: Self-pay | Admitting: Emergency Medicine

## 2020-12-18 ENCOUNTER — Emergency Department: Payer: Medicare Other

## 2020-12-18 DIAGNOSIS — A4189 Other specified sepsis: Secondary | ICD-10-CM | POA: Diagnosis not present

## 2020-12-18 DIAGNOSIS — N1832 Chronic kidney disease, stage 3b: Secondary | ICD-10-CM | POA: Diagnosis present

## 2020-12-18 DIAGNOSIS — G250 Essential tremor: Secondary | ICD-10-CM | POA: Diagnosis present

## 2020-12-18 DIAGNOSIS — I248 Other forms of acute ischemic heart disease: Secondary | ICD-10-CM | POA: Diagnosis present

## 2020-12-18 DIAGNOSIS — Z8546 Personal history of malignant neoplasm of prostate: Secondary | ICD-10-CM

## 2020-12-18 DIAGNOSIS — R197 Diarrhea, unspecified: Secondary | ICD-10-CM | POA: Diagnosis present

## 2020-12-18 DIAGNOSIS — Z8673 Personal history of transient ischemic attack (TIA), and cerebral infarction without residual deficits: Secondary | ICD-10-CM

## 2020-12-18 DIAGNOSIS — Z823 Family history of stroke: Secondary | ICD-10-CM

## 2020-12-18 DIAGNOSIS — Z7902 Long term (current) use of antithrombotics/antiplatelets: Secondary | ICD-10-CM

## 2020-12-18 DIAGNOSIS — B9781 Human metapneumovirus as the cause of diseases classified elsewhere: Secondary | ICD-10-CM | POA: Diagnosis present

## 2020-12-18 DIAGNOSIS — Z833 Family history of diabetes mellitus: Secondary | ICD-10-CM

## 2020-12-18 DIAGNOSIS — Z20822 Contact with and (suspected) exposure to covid-19: Secondary | ICD-10-CM | POA: Diagnosis present

## 2020-12-18 DIAGNOSIS — E1122 Type 2 diabetes mellitus with diabetic chronic kidney disease: Secondary | ICD-10-CM | POA: Diagnosis present

## 2020-12-18 DIAGNOSIS — D696 Thrombocytopenia, unspecified: Secondary | ICD-10-CM | POA: Diagnosis present

## 2020-12-18 DIAGNOSIS — K219 Gastro-esophageal reflux disease without esophagitis: Secondary | ICD-10-CM | POA: Diagnosis present

## 2020-12-18 DIAGNOSIS — E872 Acidosis, unspecified: Secondary | ICD-10-CM | POA: Diagnosis present

## 2020-12-18 DIAGNOSIS — F17210 Nicotine dependence, cigarettes, uncomplicated: Secondary | ICD-10-CM | POA: Diagnosis present

## 2020-12-18 DIAGNOSIS — Z87442 Personal history of urinary calculi: Secondary | ICD-10-CM

## 2020-12-18 DIAGNOSIS — E785 Hyperlipidemia, unspecified: Secondary | ICD-10-CM | POA: Diagnosis present

## 2020-12-18 DIAGNOSIS — J069 Acute upper respiratory infection, unspecified: Secondary | ICD-10-CM | POA: Diagnosis present

## 2020-12-18 DIAGNOSIS — Z66 Do not resuscitate: Secondary | ICD-10-CM | POA: Diagnosis present

## 2020-12-18 DIAGNOSIS — Z8249 Family history of ischemic heart disease and other diseases of the circulatory system: Secondary | ICD-10-CM

## 2020-12-18 DIAGNOSIS — I129 Hypertensive chronic kidney disease with stage 1 through stage 4 chronic kidney disease, or unspecified chronic kidney disease: Secondary | ICD-10-CM | POA: Diagnosis present

## 2020-12-18 DIAGNOSIS — F32A Depression, unspecified: Secondary | ICD-10-CM | POA: Diagnosis present

## 2020-12-18 DIAGNOSIS — Z888 Allergy status to other drugs, medicaments and biological substances status: Secondary | ICD-10-CM

## 2020-12-18 DIAGNOSIS — J441 Chronic obstructive pulmonary disease with (acute) exacerbation: Secondary | ICD-10-CM | POA: Diagnosis not present

## 2020-12-18 DIAGNOSIS — J209 Acute bronchitis, unspecified: Secondary | ICD-10-CM | POA: Diagnosis present

## 2020-12-18 DIAGNOSIS — H9190 Unspecified hearing loss, unspecified ear: Secondary | ICD-10-CM | POA: Diagnosis present

## 2020-12-18 DIAGNOSIS — I2510 Atherosclerotic heart disease of native coronary artery without angina pectoris: Secondary | ICD-10-CM | POA: Diagnosis present

## 2020-12-18 DIAGNOSIS — Z79899 Other long term (current) drug therapy: Secondary | ICD-10-CM

## 2020-12-18 DIAGNOSIS — F419 Anxiety disorder, unspecified: Secondary | ICD-10-CM | POA: Diagnosis present

## 2020-12-18 DIAGNOSIS — Z794 Long term (current) use of insulin: Secondary | ICD-10-CM

## 2020-12-18 DIAGNOSIS — N400 Enlarged prostate without lower urinary tract symptoms: Secondary | ICD-10-CM | POA: Diagnosis present

## 2020-12-18 DIAGNOSIS — Z85828 Personal history of other malignant neoplasm of skin: Secondary | ICD-10-CM

## 2020-12-18 DIAGNOSIS — R6520 Severe sepsis without septic shock: Secondary | ICD-10-CM | POA: Diagnosis present

## 2020-12-18 LAB — CBC WITH DIFFERENTIAL/PLATELET
Abs Immature Granulocytes: 0.04 10*3/uL (ref 0.00–0.07)
Basophils Absolute: 0 10*3/uL (ref 0.0–0.1)
Basophils Relative: 0 %
Eosinophils Absolute: 0 10*3/uL (ref 0.0–0.5)
Eosinophils Relative: 0 %
HCT: 40 % (ref 39.0–52.0)
Hemoglobin: 13.9 g/dL (ref 13.0–17.0)
Immature Granulocytes: 0 %
Lymphocytes Relative: 24 %
Lymphs Abs: 3.1 10*3/uL (ref 0.7–4.0)
MCH: 30 pg (ref 26.0–34.0)
MCHC: 34.8 g/dL (ref 30.0–36.0)
MCV: 86.4 fL (ref 80.0–100.0)
Monocytes Absolute: 1.2 10*3/uL — ABNORMAL HIGH (ref 0.1–1.0)
Monocytes Relative: 9 %
Neutro Abs: 8.8 10*3/uL — ABNORMAL HIGH (ref 1.7–7.7)
Neutrophils Relative %: 67 %
Platelets: 115 10*3/uL — ABNORMAL LOW (ref 150–400)
RBC: 4.63 MIL/uL (ref 4.22–5.81)
RDW: 14.2 % (ref 11.5–15.5)
WBC: 13.2 10*3/uL — ABNORMAL HIGH (ref 4.0–10.5)
nRBC: 0 % (ref 0.0–0.2)

## 2020-12-18 LAB — COMPREHENSIVE METABOLIC PANEL
ALT: 18 U/L (ref 0–44)
AST: 24 U/L (ref 15–41)
Albumin: 3 g/dL — ABNORMAL LOW (ref 3.5–5.0)
Alkaline Phosphatase: 54 U/L (ref 38–126)
Anion gap: 9 (ref 5–15)
BUN: 36 mg/dL — ABNORMAL HIGH (ref 8–23)
CO2: 21 mmol/L — ABNORMAL LOW (ref 22–32)
Calcium: 8.1 mg/dL — ABNORMAL LOW (ref 8.9–10.3)
Chloride: 100 mmol/L (ref 98–111)
Creatinine, Ser: 1.79 mg/dL — ABNORMAL HIGH (ref 0.61–1.24)
GFR, Estimated: 37 mL/min — ABNORMAL LOW (ref >60)
Glucose, Bld: 212 mg/dL — ABNORMAL HIGH (ref 70–99)
Potassium: 3.8 mmol/L (ref 3.5–5.1)
Sodium: 130 mmol/L — ABNORMAL LOW (ref 135–145)
Total Bilirubin: 1.2 mg/dL (ref 0.3–1.2)
Total Protein: 6.1 g/dL — ABNORMAL LOW (ref 6.5–8.1)

## 2020-12-18 LAB — TROPONIN I (HIGH SENSITIVITY): Troponin I (High Sensitivity): 33 ng/L — ABNORMAL HIGH (ref <18)

## 2020-12-18 LAB — BRAIN NATRIURETIC PEPTIDE: B Natriuretic Peptide: 172.3 pg/mL — ABNORMAL HIGH (ref 0.0–100.0)

## 2020-12-18 LAB — RESP PANEL BY RT-PCR (FLU A&B, COVID) ARPGX2
Influenza A by PCR: NEGATIVE
Influenza B by PCR: NEGATIVE
SARS Coronavirus 2 by RT PCR: NEGATIVE

## 2020-12-18 NOTE — ED Provider Notes (Signed)
Emergency Medicine Provider Triage Evaluation Note  Richard Chapman , a 80 y.o. male  was evaluated in triage.  Pt complains of chest pain, cough.  Review of Systems  Positive: Chest pain, cough Negative: No known fever, vomiting, diarrhea, abdominal pain  Physical Exam  There were no vitals taken for this visit. Gen:   Awake, no distress   Resp:  Normal effort, diffuse wheezing noted MSK:   Moves extremities without difficulty  Other:    Medical Decision Making  Medically screening exam initiated at 1:40 PM.  Appropriate orders placed.  Banjamin Gene Ottaviano was informed that the remainder of the evaluation will be completed by another provider, this initial triage assessment does not replace that evaluation, and the importance of remaining in the ED until their evaluation is complete.     Versie Starks, PA-C 12/18/20 1341    Naaman Plummer, MD 12/18/20 [phone]

## 2020-12-18 NOTE — ED Triage Notes (Signed)
Pt to ED via POV for cough, chest pain, and shortness of breath. Pt states that he has been having his symptoms for 1 week. Pt is warm to touch and has a deep congested cough. Pt does not appear to be in distress at this time.

## 2020-12-19 ENCOUNTER — Inpatient Hospital Stay
Admission: EM | Admit: 2020-12-19 | Discharge: 2020-12-20 | DRG: 872 | Disposition: A | Discharge status: Home Health | Payer: Medicare Other | Attending: Internal Medicine | Admitting: Internal Medicine

## 2020-12-19 DIAGNOSIS — F32A Depression, unspecified: Secondary | ICD-10-CM | POA: Diagnosis present

## 2020-12-19 DIAGNOSIS — N400 Enlarged prostate without lower urinary tract symptoms: Secondary | ICD-10-CM | POA: Diagnosis present

## 2020-12-19 DIAGNOSIS — I2510 Atherosclerotic heart disease of native coronary artery without angina pectoris: Secondary | ICD-10-CM | POA: Diagnosis present

## 2020-12-19 DIAGNOSIS — N183 Chronic kidney disease, stage 3 unspecified: Secondary | ICD-10-CM | POA: Diagnosis present

## 2020-12-19 DIAGNOSIS — E785 Hyperlipidemia, unspecified: Secondary | ICD-10-CM | POA: Diagnosis present

## 2020-12-19 DIAGNOSIS — D696 Thrombocytopenia, unspecified: Secondary | ICD-10-CM | POA: Diagnosis present

## 2020-12-19 DIAGNOSIS — A4189 Other specified sepsis: Secondary | ICD-10-CM | POA: Diagnosis present

## 2020-12-19 DIAGNOSIS — K219 Gastro-esophageal reflux disease without esophagitis: Secondary | ICD-10-CM | POA: Diagnosis present

## 2020-12-19 DIAGNOSIS — J441 Chronic obstructive pulmonary disease with (acute) exacerbation: Secondary | ICD-10-CM | POA: Diagnosis present

## 2020-12-19 DIAGNOSIS — N1832 Chronic kidney disease, stage 3b: Secondary | ICD-10-CM | POA: Diagnosis present

## 2020-12-19 DIAGNOSIS — J069 Acute upper respiratory infection, unspecified: Secondary | ICD-10-CM

## 2020-12-19 DIAGNOSIS — R778 Other specified abnormalities of plasma proteins: Secondary | ICD-10-CM | POA: Diagnosis present

## 2020-12-19 DIAGNOSIS — Z833 Family history of diabetes mellitus: Secondary | ICD-10-CM | POA: Diagnosis not present

## 2020-12-19 DIAGNOSIS — J209 Acute bronchitis, unspecified: Secondary | ICD-10-CM | POA: Diagnosis present

## 2020-12-19 DIAGNOSIS — B9781 Human metapneumovirus as the cause of diseases classified elsewhere: Secondary | ICD-10-CM | POA: Diagnosis present

## 2020-12-19 DIAGNOSIS — G250 Essential tremor: Secondary | ICD-10-CM | POA: Diagnosis present

## 2020-12-19 DIAGNOSIS — R197 Diarrhea, unspecified: Secondary | ICD-10-CM | POA: Diagnosis present

## 2020-12-19 DIAGNOSIS — I10 Essential (primary) hypertension: Secondary | ICD-10-CM | POA: Diagnosis present

## 2020-12-19 DIAGNOSIS — I248 Other forms of acute ischemic heart disease: Secondary | ICD-10-CM | POA: Diagnosis present

## 2020-12-19 DIAGNOSIS — R6520 Severe sepsis without septic shock: Secondary | ICD-10-CM | POA: Diagnosis present

## 2020-12-19 DIAGNOSIS — F17210 Nicotine dependence, cigarettes, uncomplicated: Secondary | ICD-10-CM | POA: Diagnosis present

## 2020-12-19 DIAGNOSIS — A419 Sepsis, unspecified organism: Secondary | ICD-10-CM | POA: Diagnosis present

## 2020-12-19 DIAGNOSIS — Z8249 Family history of ischemic heart disease and other diseases of the circulatory system: Secondary | ICD-10-CM | POA: Diagnosis not present

## 2020-12-19 DIAGNOSIS — E872 Acidosis, unspecified: Secondary | ICD-10-CM | POA: Diagnosis present

## 2020-12-19 DIAGNOSIS — E1129 Type 2 diabetes mellitus with other diabetic kidney complication: Secondary | ICD-10-CM | POA: Diagnosis present

## 2020-12-19 DIAGNOSIS — R079 Chest pain, unspecified: Secondary | ICD-10-CM | POA: Diagnosis present

## 2020-12-19 DIAGNOSIS — E1122 Type 2 diabetes mellitus with diabetic chronic kidney disease: Secondary | ICD-10-CM | POA: Diagnosis present

## 2020-12-19 DIAGNOSIS — Z20822 Contact with and (suspected) exposure to covid-19: Secondary | ICD-10-CM | POA: Diagnosis present

## 2020-12-19 DIAGNOSIS — I129 Hypertensive chronic kidney disease with stage 1 through stage 4 chronic kidney disease, or unspecified chronic kidney disease: Secondary | ICD-10-CM | POA: Diagnosis present

## 2020-12-19 DIAGNOSIS — Z66 Do not resuscitate: Secondary | ICD-10-CM | POA: Diagnosis present

## 2020-12-19 DIAGNOSIS — Z720 Tobacco use: Secondary | ICD-10-CM | POA: Diagnosis present

## 2020-12-19 DIAGNOSIS — Z823 Family history of stroke: Secondary | ICD-10-CM | POA: Diagnosis not present

## 2020-12-19 DIAGNOSIS — I639 Cerebral infarction, unspecified: Secondary | ICD-10-CM | POA: Diagnosis present

## 2020-12-19 DIAGNOSIS — R7989 Other specified abnormal findings of blood chemistry: Secondary | ICD-10-CM | POA: Diagnosis present

## 2020-12-19 LAB — URINALYSIS, COMPLETE (UACMP) WITH MICROSCOPIC
Bacteria, UA: NONE SEEN
Bilirubin Urine: NEGATIVE
Glucose, UA: 150 mg/dL — AB
Ketones, ur: NEGATIVE mg/dL
Leukocytes,Ua: NEGATIVE
Nitrite: NEGATIVE
Protein, ur: >=300 mg/dL — AB
Specific Gravity, Urine: 1.02 (ref 1.005–1.030)
Squamous Epithelial / HPF: NONE SEEN (ref 0–5)
pH: 5 (ref 5.0–8.0)

## 2020-12-19 LAB — CBG MONITORING, ED
Glucose-Capillary: 324 mg/dL — ABNORMAL HIGH (ref 70–99)
Glucose-Capillary: 352 mg/dL — ABNORMAL HIGH (ref 70–99)
Glucose-Capillary: 186 mg/dL — ABNORMAL HIGH (ref 70–99)
Glucose-Capillary: 269 mg/dL — ABNORMAL HIGH (ref 70–99)

## 2020-12-19 LAB — LACTIC ACID, PLASMA
Lactic Acid, Venous: 1.4 mmol/L (ref 0.5–1.9)
Lactic Acid, Venous: 2.8 mmol/L (ref 0.5–1.9)

## 2020-12-19 LAB — RESPIRATORY PANEL BY PCR

## 2020-12-19 LAB — TROPONIN I (HIGH SENSITIVITY)
Troponin I (High Sensitivity): 24 ng/L — ABNORMAL HIGH (ref <18)
Troponin I (High Sensitivity): 25 ng/L — ABNORMAL HIGH (ref <18)
Troponin I (High Sensitivity): 24 ng/L — ABNORMAL HIGH (ref <18)

## 2020-12-19 LAB — STREP PNEUMONIAE URINARY ANTIGEN: Strep Pneumo Urinary Antigen: NEGATIVE

## 2020-12-19 LAB — PROCALCITONIN: Procalcitonin: 0.18 ng/mL

## 2020-12-19 MED ORDER — CLOPIDOGREL BISULFATE 75 MG PO TABS
75.0000 mg | ORAL_TABLET | Freq: Every day | ORAL | Status: DC
  Administered 2020-12-19 – 2020-12-20 (×2): 75 mg via ORAL
  Filled 2020-12-19 (×2): qty 1

## 2020-12-19 MED ORDER — SODIUM CHLORIDE 0.9 % IV SOLN
1.0000 g | INTRAVENOUS | Status: DC
  Administered 2020-12-20: 1 g via INTRAVENOUS
  Filled 2020-12-19 (×2): qty 10

## 2020-12-19 MED ORDER — METHYLPREDNISOLONE SODIUM SUCC 125 MG IJ SOLR
125.0000 mg | Freq: Once | INTRAMUSCULAR | Status: AC
  Administered 2020-12-19: 125 mg via INTRAVENOUS
  Filled 2020-12-19: qty 2

## 2020-12-19 MED ORDER — TRAZODONE HCL 100 MG PO TABS
100.0000 mg | ORAL_TABLET | Freq: Every day | ORAL | Status: DC
  Administered 2020-12-19: 100 mg via ORAL
  Filled 2020-12-19: qty 1

## 2020-12-19 MED ORDER — IPRATROPIUM-ALBUTEROL 0.5-2.5 (3) MG/3ML IN SOLN
3.0000 mL | RESPIRATORY_TRACT | Status: DC
  Administered 2020-12-19 – 2020-12-20 (×4): 3 mL via RESPIRATORY_TRACT
  Filled 2020-12-19 (×4): qty 3

## 2020-12-19 MED ORDER — HYOSCYAMINE SULFATE ER 0.375 MG PO TB12
0.3750 mg | ORAL_TABLET | Freq: Two times a day (BID) | ORAL | Status: DC
  Administered 2020-12-19 – 2020-12-20 (×2): 0.375 mg via ORAL
  Filled 2020-12-19 (×5): qty 1

## 2020-12-19 MED ORDER — SODIUM CHLORIDE 0.9 % IV BOLUS (SEPSIS)
1000.0000 mL | Freq: Once | INTRAVENOUS | Status: AC
  Administered 2020-12-19: 1000 mL via INTRAVENOUS

## 2020-12-19 MED ORDER — MAGNESIUM OXIDE 400 MG PO TABS
400.0000 mg | ORAL_TABLET | Freq: Every day | ORAL | Status: DC
  Administered 2020-12-19 – 2020-12-20 (×2): 400 mg via ORAL
  Filled 2020-12-19 (×4): qty 1

## 2020-12-19 MED ORDER — INSULIN ASPART 100 UNIT/ML IJ SOLN
0.0000 [IU] | Freq: Every day | INTRAMUSCULAR | Status: DC
  Administered 2020-12-19: 3 [IU] via SUBCUTANEOUS
  Filled 2020-12-19: qty 1

## 2020-12-19 MED ORDER — DM-GUAIFENESIN ER 30-600 MG PO TB12: 1.0000 | ORAL_TABLET | Freq: Two times a day (BID) | ORAL | Status: DC | PRN

## 2020-12-19 MED ORDER — PANTOPRAZOLE SODIUM 40 MG PO TBEC
40.0000 mg | DELAYED_RELEASE_TABLET | Freq: Every day | ORAL | Status: DC
  Administered 2020-12-19 – 2020-12-20 (×2): 40 mg via ORAL
  Filled 2020-12-19 (×2): qty 1

## 2020-12-19 MED ORDER — VITAMIN B-12 1000 MCG PO TABS
1000.0000 ug | ORAL_TABLET | Freq: Every day | ORAL | Status: DC
  Administered 2020-12-20: 1000 ug via ORAL
  Filled 2020-12-19 (×3): qty 1

## 2020-12-19 MED ORDER — SODIUM CHLORIDE 0.9 % IV SOLN
500.0000 mg | INTRAVENOUS | Status: DC
  Administered 2020-12-20: 500 mg via INTRAVENOUS
  Filled 2020-12-19 (×2): qty 500

## 2020-12-19 MED ORDER — MORPHINE SULFATE (PF) 2 MG/ML IV SOLN: 0.5000 mg | INTRAVENOUS | Status: DC | PRN

## 2020-12-19 MED ORDER — GABAPENTIN 300 MG PO CAPS
300.0000 mg | ORAL_CAPSULE | Freq: Every day | ORAL | Status: DC
  Administered 2020-12-19: 300 mg via ORAL
  Filled 2020-12-19: qty 1

## 2020-12-19 MED ORDER — INSULIN GLARGINE-YFGN 100 UNIT/ML ~~LOC~~ SOLN
15.0000 [IU] | Freq: Every day | SUBCUTANEOUS | Status: DC
  Administered 2020-12-19 – 2020-12-20 (×2): 15 [IU] via SUBCUTANEOUS
  Filled 2020-12-19 (×3): qty 0.15

## 2020-12-19 MED ORDER — HYDRALAZINE HCL 20 MG/ML IJ SOLN
5.0000 mg | INTRAMUSCULAR | Status: DC | PRN
  Filled 2020-12-19: qty 1

## 2020-12-19 MED ORDER — SODIUM CHLORIDE 0.9 % IV SOLN
500.0000 mg | Freq: Once | INTRAVENOUS | Status: AC
  Administered 2020-12-19: 500 mg via INTRAVENOUS
  Filled 2020-12-19: qty 500

## 2020-12-19 MED ORDER — TAMSULOSIN HCL 0.4 MG PO CAPS
0.4000 mg | ORAL_CAPSULE | Freq: Every day | ORAL | Status: DC
  Administered 2020-12-19 – 2020-12-20 (×2): 0.4 mg via ORAL
  Filled 2020-12-19 (×2): qty 1

## 2020-12-19 MED ORDER — ACETAMINOPHEN 325 MG PO TABS
650.0000 mg | ORAL_TABLET | Freq: Four times a day (QID) | ORAL | Status: DC | PRN
  Administered 2020-12-19: 650 mg via ORAL
  Filled 2020-12-19: qty 2

## 2020-12-19 MED ORDER — ESCITALOPRAM OXALATE 20 MG PO TABS
20.0000 mg | ORAL_TABLET | Freq: Two times a day (BID) | ORAL | Status: DC
  Administered 2020-12-19 – 2020-12-20 (×3): 20 mg via ORAL
  Filled 2020-12-19 (×3): qty 1
  Filled 2020-12-19 (×2): qty 2

## 2020-12-19 MED ORDER — NICOTINE 21 MG/24HR TD PT24
21.0000 mg | MEDICATED_PATCH | Freq: Every day | TRANSDERMAL | Status: DC
  Filled 2020-12-19 (×2): qty 1

## 2020-12-19 MED ORDER — AMLODIPINE BESYLATE 10 MG PO TABS
10.0000 mg | ORAL_TABLET | Freq: Every day | ORAL | Status: DC
  Administered 2020-12-19 – 2020-12-20 (×2): 10 mg via ORAL
  Filled 2020-12-19: qty 2
  Filled 2020-12-19: qty 1

## 2020-12-19 MED ORDER — ATORVASTATIN CALCIUM 10 MG PO TABS
5.0000 mg | ORAL_TABLET | Freq: Every day | ORAL | Status: DC
  Administered 2020-12-19: 5 mg via ORAL
  Filled 2020-12-19: qty 0.5

## 2020-12-19 MED ORDER — IPRATROPIUM-ALBUTEROL 0.5-2.5 (3) MG/3ML IN SOLN
3.0000 mL | RESPIRATORY_TRACT | Status: AC
  Administered 2020-12-19 (×3): 3 mL via RESPIRATORY_TRACT
  Filled 2020-12-19: qty 3
  Filled 2020-12-19: qty 6

## 2020-12-19 MED ORDER — ALBUTEROL SULFATE (2.5 MG/3ML) 0.083% IN NEBU: 2.5000 mg | INHALATION_SOLUTION | RESPIRATORY_TRACT | Status: DC | PRN

## 2020-12-19 MED ORDER — METHYLPREDNISOLONE SODIUM SUCC 40 MG IJ SOLR
40.0000 mg | Freq: Once | INTRAMUSCULAR | Status: AC
  Administered 2020-12-19: 40 mg via INTRAVENOUS
  Filled 2020-12-19: qty 1

## 2020-12-19 MED ORDER — ONDANSETRON HCL 4 MG/2ML IJ SOLN: 4.0000 mg | Freq: Three times a day (TID) | INTRAMUSCULAR | Status: DC | PRN

## 2020-12-19 MED ORDER — VITAMIN D 25 MCG (1000 UNIT) PO TABS
1000.0000 [IU] | ORAL_TABLET | Freq: Every day | ORAL | Status: DC
  Administered 2020-12-19 – 2020-12-20 (×2): 1000 [IU] via ORAL
  Filled 2020-12-19 (×2): qty 1

## 2020-12-19 MED ORDER — INSULIN ASPART 100 UNIT/ML IJ SOLN
0.0000 [IU] | Freq: Three times a day (TID) | INTRAMUSCULAR | Status: DC
  Administered 2020-12-19: 7 [IU] via SUBCUTANEOUS
  Administered 2020-12-19: 2 [IU] via SUBCUTANEOUS
  Administered 2020-12-19: 9 [IU] via SUBCUTANEOUS
  Administered 2020-12-20: 5 [IU] via SUBCUTANEOUS
  Administered 2020-12-20: 3 [IU] via SUBCUTANEOUS
  Filled 2020-12-19 (×5): qty 1

## 2020-12-19 MED ORDER — SODIUM CHLORIDE 0.9 % IV SOLN: INTRAVENOUS | Status: DC

## 2020-12-19 MED ORDER — SODIUM CHLORIDE 0.9 % IV SOLN
1.0000 g | Freq: Once | INTRAVENOUS | Status: AC
  Administered 2020-12-19: 1 g via INTRAVENOUS
  Filled 2020-12-19: qty 10

## 2020-12-19 MED ORDER — PRIMIDONE 50 MG PO TABS
50.0000 mg | ORAL_TABLET | Freq: Two times a day (BID) | ORAL | Status: DC
  Administered 2020-12-19 – 2020-12-20 (×2): 50 mg via ORAL
  Filled 2020-12-19 (×4): qty 1

## 2020-12-19 MED ORDER — PROPRANOLOL HCL 20 MG PO TABS
20.0000 mg | ORAL_TABLET | Freq: Two times a day (BID) | ORAL | Status: DC
  Administered 2020-12-19 – 2020-12-20 (×2): 20 mg via ORAL
  Filled 2020-12-19 (×4): qty 1

## 2020-12-19 MED ORDER — ISOSORBIDE MONONITRATE ER 60 MG PO TB24
60.0000 mg | ORAL_TABLET | Freq: Every day | ORAL | Status: DC
  Administered 2020-12-19 – 2020-12-20 (×2): 60 mg via ORAL
  Filled 2020-12-19 (×2): qty 1

## 2020-12-19 MED ORDER — LORATADINE 10 MG PO TABS
10.0000 mg | ORAL_TABLET | Freq: Every day | ORAL | Status: DC
  Administered 2020-12-19 – 2020-12-20 (×2): 10 mg via ORAL
  Filled 2020-12-19 (×2): qty 1

## 2020-12-19 MED ORDER — NITROGLYCERIN 0.4 MG SL SUBL: 0.4000 mg | SUBLINGUAL_TABLET | SUBLINGUAL | Status: DC | PRN

## 2020-12-19 NOTE — ED Notes (Signed)
Patient set up with dinner tray, eating at this time

## 2020-12-19 NOTE — H&P (Addendum)
History and Physical    Richard Chapman BDZ:[id number] DOB: 15-Mar-1940 DOA: 12/19/2020  Referring MD/NP/PA:   PCP: Leonel Ramsay, MD   Patient coming from:  The patient is coming from home.     Chief Complaint: SOB  HPI: Richard Chapman is a 80 y.o. male with medical history significant of hypertension, hyperlipidemia, diabetes mellitus, stroke, GERD, depression with anxiety, OSA not on CPAP, hard of hearing, remote subdural hematoma, prostate cancer, BPH, CKD stage IIIb, CAD, tobacco abuse, essential tremor, who presents with shortness breath.  Patient states that he has shortness of breath for more than 1 week, which has been progressively worsening.  Patient has cough with little mucus production.  Patient also has fever and chills.  He states that he had mild substernal chest pain which has resolved.  Currently no active chest pain.  Patient reports diarrhea in the past 2 days, 1-3 times of watery diarrhea each day.  No active nausea, vomiting or abdominal pain.  No symptoms of UTI. Pt states he has 2 children who have had similar upper respiratory symptoms.  ED Course: pt was found to have WBC 13.2, lactic acid 1.4, 2.8, BMP 1733, troponin level 33, 24, 25, 24, negative COVID PCR, renal function close to baseline, temperature 100.9, blood pressure 161/83, heart rate 58, RR 18, oxygen saturation 94% on room air.  Chest x-ray showed bronchitic change and increased interstitial marking.  Patient is admitted to progressive bed as inpatient.  Review of Systems:   General: has fevers, chills, no body weight gain, has poor appetite, has fatigue HEENT: no blurry vision, hearing changes or sore throat Respiratory: has dyspnea, coughing, wheezing CV: has chest pain, no palpitations GI: no nausea, vomiting, abdominal pain, has diarrhea, no constipation GU: no dysuria, burning on urination, increased urinary frequency, hematuria  Ext: has trace leg edema Neuro: no unilateral weakness,  numbness, or tingling, no vision change or hearing loss Skin: no rash, no skin tear. MSK: No muscle spasm, no deformity, no limitation of range of movement in spin Heme: No easy bruising.  Travel history: No recent long distant travel.  Allergy:  Allergies  Allergen Reactions   Lisinopril Other (See Comments) and Cough    Past Medical History:  Diagnosis Date   Anxiety and depression    Depression    Diabetes (HCC)    Elevated PSA    GERD (gastroesophageal reflux disease)    History of nephrolithiasis    Hypertension    Skin cancer    Sleep apnea     Past Surgical History:  Procedure Laterality Date   APPENDECTOMY     BACK SURGERY     BURR HOLE FOR SUBDURAL HEMATOMA     2015   KNEE SURGERY Bilateral    NECK SURGERY     SHOULDER SURGERY     TONSILLECTOMY     WRIST SURGERY      Social History:  reports that he has been smoking cigarettes. He has been smoking an average of 1 pack per day. He has never used smokeless tobacco. He reports current alcohol use of about 2.0 standard drinks per week. He reports that he does not use drugs.  Family History:  Family History  Problem Relation Age of Onset   Heart disease Mother    Heart disease Father    Diabetes Father    Stroke Father    Kidney disease Neg Hx    Prostate cancer Neg Hx  Prior to Admission medications   Medication Sig Start Date End Date Taking? Authorizing Provider  acetaminophen (TYLENOL) 325 MG tablet Take 2 tablets (650 mg total) every 6 (six) hours as needed by mouth for mild pain (or Fever >/= 101). 12/18/16  Yes Gouru, Illene Silver, MD  amLODipine (NORVASC) 10 MG tablet Take 1 tablet (10 mg total) by mouth daily. 04/07/15  Yes Fritzi Mandes, MD  atorvastatin (LIPITOR) 10 MG tablet Take 5 mg by mouth daily.   Yes [provider]  cetirizine (ZYRTEC) 10 MG tablet Take 10 mg by mouth daily.   Yes [provider]  Cholecalciferol 25 MCG (1000 UT) tablet Take 1 tablet by mouth daily. 03/12/20   Yes [provider]  clopidogrel (PLAVIX) 75 MG tablet Take 1 tablet (75 mg total) by mouth daily. 04/07/15  Yes Fritzi Mandes, MD  escitalopram (LEXAPRO) 20 MG tablet Take 1 tablet (20 mg total) by mouth daily. Patient taking differently: Take 20 mg by mouth 2 (two) times daily. 11/26/14  Yes Gouru, Illene Silver, MD  esomeprazole (NEXIUM) 20 MG capsule Take 1 capsule (20 mg total) by mouth AC breakfast. 09/24/15  Yes Fritzi Mandes, MD  famotidine (PEPCID) 20 MG tablet Take 1 tablet by mouth at bedtime. 01/21/20  Yes [provider]  gabapentin (NEURONTIN) 300 MG capsule TAKE ONE CAPSULE BY MOUTH AT BEDTIME FOR NERVE PAIN 03/26/20  Yes [provider]  hyoscyamine (LEVBID) 0.375 MG 12 hr tablet Take 0.375 mg by mouth every 12 (twelve) hours.   Yes [provider]  insulin degludec (TRESIBA) 100 UNIT/ML SOPN FlexTouch Pen Inject 20 Units into the skin daily. 02/22/18  Yes [provider]  isosorbide mononitrate (IMDUR) 60 MG 24 hr tablet Take 1 tablet (60 mg total) by mouth daily. 04/19/18  Yes Wieting, Richard, MD  losartan (COZAAR) 100 MG tablet Take 100 mg by mouth daily.    Yes [provider]  magnesium oxide (MAG-OX) 400 MG tablet Take 400 mg by mouth daily.    Yes [provider]  nitroGLYCERIN (NITROSTAT) 0.4 MG SL tablet Place 0.4 mg under the tongue every 5 (five) minutes as needed for chest pain.    Yes [provider]  Nutritional Supplements (FEEDING SUPPLEMENT, GLUCERNA 1.2 CAL,) LIQD Take by mouth. 12/05/19  Yes [provider]  Roma Schanz test strip SMARTSIG:1 Each Via Meter Daily 03/03/20  Yes [provider]  primidone (MYSOLINE) 50 MG tablet Take 1 tablet by mouth 2 (two) times daily. 11/04/14  Yes [provider]  propranolol (INDERAL) 40 MG tablet TAKE ONE-HALF TABLET BY MOUTH TWO TIMES A DAY FOR BLOOD PRESSURE 02/25/20  Yes [provider]  tamsulosin (FLOMAX) 0.4 MG CAPS capsule Take 0.4  mg by mouth daily.    Yes [provider]  traZODone (DESYREL) 100 MG tablet Take 1 tablet (100 mg total) by mouth at bedtime. 04/19/18  Yes Wieting, Richard, MD  vitamin B-12 (CYANOCOBALAMIN) 1000 MCG tablet Take 1,000 mcg by mouth daily.    Yes [provider]  white petrolatum (VASELINE) OINT Apply 1 application topically as needed (Wound care). 04/19/18  Yes Wieting, Richard, MD  docusate sodium (COLACE) 100 MG capsule Take 1 capsule (100 mg total) by mouth 2 (two) times daily. Patient not taking: Reported on 12/19/2020 04/19/18   Loletha Grayer, MD    Physical Exam: Vitals:   12/19/20 0600 12/19/20 0630 12/19/20 1000 12/19/20 1100  BP: (!) 179/81 (!) 161/83 (!) 183/53 (!) 167/65  Pulse: 68  63 74 74  Resp: 18 17 16    Temp:   98 F (36.7 C)   TempSrc:   Oral   SpO2: 94% 94% 97% 95%  Height:       General: Not in acute distress HEENT:       Eyes: PERRL, EOMI, no scleral icterus.       ENT: No discharge from the ears and nose, no pharynx injection, no tonsillar enlargement.        Neck: No JVD, no bruit, no mass felt. Heme: No neck lymph node enlargement. Cardiac: S1/S2, RRR, No murmurs, No gallops or rubs. Respiratory: Has wheezing bilaterally GI: Soft, nondistended, nontender, no rebound pain, no organomegaly, BS present. GU: No hematuria Ext: has traced leg edema bilaterally. 1+DP/PT pulse bilaterally. Musculoskeletal: No joint deformities, No joint redness or warmth, no limitation of ROM in spin. Skin: No rashes.  Neuro: Alert, oriented X3, cranial nerves II-XII grossly intact, moves all extremities normally. Marland Kitchen Psych: Patient is not psychotic, no suicidal or hemocidal ideation.  Labs on Admission: I have personally reviewed following labs and imaging studies  CBC: Recent Labs  Lab 12/18/20 1409  WBC 13.2*  NEUTROABS 8.8*  HGB 13.9  HCT 40.0  MCV 86.4  PLT 841*   Basic Metabolic Panel: Recent Labs  Lab 12/18/20 1409  NA 130*  K 3.8  CL 100   CO2 21*  GLUCOSE 212*  BUN 36*  CREATININE 1.79*  CALCIUM 8.1*   GFR: CrCl cannot be calculated (Unknown ideal weight.). Liver Function Tests: Recent Labs  Lab 12/18/20 1409  AST 24  ALT 18  ALKPHOS 54  BILITOT 1.2  PROT 6.1*  ALBUMIN 3.0*   No results for input(s): LIPASE, AMYLASE in the last 168 hours. No results for input(s): AMMONIA in the last 168 hours. Coagulation Profile: No results for input(s): INR, PROTIME in the last 168 hours. Cardiac Enzymes: No results for input(s): CKTOTAL, CKMB, CKMBINDEX, TROPONINI in the last 168 hours. BNP (last 3 results) No results for input(s): PROBNP in the last 8760 hours. HbA1C: No results for input(s): HGBA1C in the last 72 hours. CBG: Recent Labs  Lab 12/19/20 1009 12/19/20 1215  GLUCAP 324* 352*   Lipid Profile: No results for input(s): CHOL, HDL, LDLCALC, TRIG, CHOLHDL, LDLDIRECT in the last 72 hours. Thyroid Function Tests: No results for input(s): TSH, T4TOTAL, FREET4, T3FREE, THYROIDAB in the last 72 hours. Anemia Panel: No results for input(s): VITAMINB12, FOLATE, FERRITIN, TIBC, IRON, RETICCTPCT in the last 72 hours. Urine analysis:    Component Value Date/Time   COLORURINE YELLOW (A) 12/19/2020 0405   APPEARANCEUR HAZY (A) 12/19/2020 0405   APPEARANCEUR Clear 10/23/2014 1058   LABSPEC 1.020 12/19/2020 0405   PHURINE 5.0 12/19/2020 0405   GLUCOSEU 150 (A) 12/19/2020 0405   HGBUR MODERATE (A) 12/19/2020 0405   BILIRUBINUR NEGATIVE 12/19/2020 0405   BILIRUBINUR Negative 10/23/2014 1058   KETONESUR NEGATIVE 12/19/2020 0405   PROTEINUR >=300 (A) 12/19/2020 0405   NITRITE NEGATIVE 12/19/2020 0405   LEUKOCYTESUR NEGATIVE 12/19/2020 0405   Sepsis Labs: @LABRCNTIP (procalcitonin:4,lacticidven:4) ) Recent Results (from the past 240 hour(s))  Resp Panel by RT-PCR (Flu A&B, Covid) Nasopharyngeal Swab     Status: None   Collection Time: 12/18/20  2:09 PM   Specimen: Nasopharyngeal Swab; Nasopharyngeal(NP) swabs  in vial transport medium  Result Value Ref Range Status   SARS Coronavirus 2 by RT PCR NEGATIVE NEGATIVE Final    Comment: (NOTE) SARS-CoV-2 target nucleic acids are NOT DETECTED.  The SARS-CoV-2 RNA is generally detectable in upper respiratory specimens during the acute phase of infection. The lowest concentration of SARS-CoV-2 viral copies this assay can detect is 138 copies/mL. A negative result does not preclude SARS-Cov-2 infection and should not be used as the sole basis for treatment or other patient management decisions. A negative result may occur with  improper specimen collection/handling, submission of specimen other than nasopharyngeal swab, presence of viral mutation(s) within the areas targeted by this assay, and inadequate number of viral copies(<138 copies/mL). A negative result must be combined with clinical observations, patient history, and epidemiological information. The expected result is Negative.  Fact Sheet for Patients:  EntrepreneurPulse.com.au  Fact Sheet for Healthcare Providers:  IncredibleEmployment.be  This test is no t yet approved or cleared by the [place] FDA and  has been authorized for detection and/or diagnosis of SARS-CoV-2 by FDA under an Emergency Use Authorization (EUA). This EUA will remain  in effect (meaning this test can be used) for the duration of the COVID-19 declaration under Section 564(b)(1) of the Act, 21 U.S.C.section 360bbb-3(b)(1), unless the authorization is terminated  or revoked sooner.       Influenza A by PCR NEGATIVE NEGATIVE Final   Influenza B by PCR NEGATIVE NEGATIVE Final    Comment: (NOTE) The Xpert Xpress SARS-CoV-2/FLU/RSV plus assay is intended as an aid in the diagnosis of influenza from Nasopharyngeal swab specimens and should not be used as a sole basis for treatment. Nasal washings and aspirates are unacceptable for Xpert Xpress  SARS-CoV-2/FLU/RSV testing.  Fact Sheet for Patients: EntrepreneurPulse.com.au  Fact Sheet for Healthcare Providers: IncredibleEmployment.be  This test is not yet approved or cleared by the [place] FDA and has been authorized for detection and/or diagnosis of SARS-CoV-2 by FDA under an Emergency Use Authorization (EUA). This EUA will remain in effect (meaning this test can be used) for the duration of the COVID-19 declaration under Section 564(b)(1) of the Act, 21 U.S.C. section 360bbb-3(b)(1), unless the authorization is terminated or revoked.  Performed at [facility], [place], [place] [address]   Respiratory (~20 pathogens) panel by PCR     Status: Abnormal   Collection Time: 12/19/20  4:02 AM   Specimen: Nasopharyngeal Swab; Respiratory  Result Value Ref Range Status   Adenovirus NOT DETECTED NOT DETECTED Final   Coronavirus 229E NOT DETECTED NOT DETECTED Final    Comment: (NOTE) The Coronavirus on the Respiratory Panel, DOES NOT test for the novel  Coronavirus (2019 nCoV)    Coronavirus HKU1 NOT DETECTED NOT DETECTED Final   Coronavirus NL63 NOT DETECTED NOT DETECTED Final   Coronavirus OC43 NOT DETECTED NOT DETECTED Final   Metapneumovirus DETECTED (A) NOT DETECTED Final   Rhinovirus / Enterovirus NOT DETECTED NOT DETECTED Final   Influenza A NOT DETECTED NOT DETECTED Final   Influenza B NOT DETECTED NOT DETECTED Final   Parainfluenza Virus 1 NOT DETECTED NOT DETECTED Final   Parainfluenza Virus 2 NOT DETECTED NOT DETECTED Final   Parainfluenza Virus 3 NOT DETECTED NOT DETECTED Final   Parainfluenza Virus 4 NOT DETECTED NOT DETECTED Final   Respiratory Syncytial Virus NOT DETECTED NOT DETECTED Final   Bordetella pertussis NOT DETECTED NOT DETECTED Final   Bordetella Parapertussis NOT DETECTED NOT DETECTED Final   Chlamydophila pneumoniae NOT DETECTED NOT DETECTED Final   Mycoplasma pneumoniae NOT  DETECTED NOT DETECTED Final    Comment: Performed at [facility], [place]. [address] [place], [place] [address]  Radiological Exams on Admission: DG Chest 2 View  Result Date: 12/18/2020 CLINICAL DATA:  Cough and wheezing. EXAM: CHEST - 2 VIEW COMPARISON:  April 14, 2018 FINDINGS: Calcific atherosclerotic disease and tortuosity of the aorta. Cardiomediastinal silhouette is normal. Mediastinal contours appear intact. There is no evidence of focal airspace consolidation, pleural effusion or pneumothorax. Bronchitic changes and mild exaggeration of the interstitial markings which may be partially due to hypoinflation. Osseous structures are without acute abnormality. Soft tissues are grossly normal. IMPRESSION: Bronchitic changes and mild exaggeration of the interstitial markings which may be partially due to hypoinflation. Electronically Signed   By: Fidela Salisbury M.D.   On: 12/18/2020 15:04     EKG: I have personally reviewed.  Sinus rhythm, QTC 478, low voltage, bifascicular block  Assessment/Plan Principal Problem:   COPD exacerbation (HCC) Active Problems:   Clinical depression   Chest pain   CAD in native artery   Enlarged prostate   Gastro-esophageal reflux disease without esophagitis   Stroke (HCC)   Severe sepsis (HCC)   Acute bronchitis   CKD (chronic kidney disease), stage IIIb   Type II diabetes mellitus with renal manifestations (HCC)   HTN (hypertension)   HLD (hyperlipidemia)   Tobacco abuse   Thrombocytopenia (HCC)   Elevated troponin   Diarrhea   Severe sepsis due to possible COPD exacerbation and  acute bronchitis: Chest x-ray negative for obvious infiltration.  Patient is long time smoker, patient may have undiagnosed COPD with acute exacerbation today.  Acute bronchitis and early stage of pneumonia are also possible.  Patient has severe sepsis with WBC 13.2, fever of 100.9.  Lactic acid 1.4 --> 2.8.  - will admit to progressive unit as  inpatient -Bronchodilators -Solu-Medrol 40 mg IV bid -IV Rocephin and azithromycin -Mucinex for cough  -Incentive spirometry -Urine S. pneumococcal antigen -Follow up blood culture x2, sputum culture, RVP -Nasal cannula oxygen as needed to maintain O2 saturation 93% or greater -will get Procalcitonin -IVF: 2L of NS bolus in ED, followed by 75 cc/h   Addendum: RVP is positive for metapneumovirus  Clinical depression -Lexapro  Chest pain, elevated troponin and CAD in native artery: Patient had some chest pain earlier, which has resolved.  Troponin minimally elevated, plateaued, 33, 24, 25, 24.  Likely due to demand ischemia -Plavix, Lipitor, -As needed nitroglycerin, morphine -Imdur -Check A1c, FLP  Enlarged prostate -Flomax  Gastro-esophageal reflux disease without esophagitis -Protonix  Stroke (HCC) -Plavix, Lipitor  CKD (chronic kidney disease), stage IIIb: Recent baseline creatinine 1.5-2.0.  His creatinine is 1.79, BUN 36, close to baseline -Follow-up with BMP  Type II diabetes mellitus with renal manifestations [facility]): Recent A1c 7.4, poorly controlled.  Patient is taking [place] insulin -Sliding scale insulin -Change Tresiba insulin from 20 to 15 unit for glargine insulin daily  HTN (hypertension) -Hold Cozaar since patient is at high risk of developing hypotension due to severe sepsis -Continue home amlodipine, Inderal -IV hydralazine as needed  HLD (hyperlipidemia) -Lipitor  Tobacco abuse -Did counseling about importance of quitting smoking -Nicotine patch  Thrombocytopenia ([place]): Platelet 115.  Patient had thrombocytopenia in the past, 121 on 12/16/2016, may be due to ongoing infection.  Mental status normal -Check LDH, peripheral smear -Follow-up with CBC  Diarrhea: -IV fluid as above -Check C. difficile and GI pathogen panel  Essential tremor -Primidone         DVT ppx: SCD Code Status: DNR per pt and his wife Family Communication: Yes,  patient's wife at bed side Disposition  Plan:  Anticipate discharge back to previous environment Consults called:  none Admission status and Level of care: Progressive Cardiac:     as inpt        Status is: Inpatient  Remains inpatient appropriate because: Patient has multiple comorbidities, now presents with possible acute bronchitis versus undiagnosed COPD with acute exacerbation, patient has sepsis and elevated troponin with chest pain.  Also has diarrhea, thrombocytopenia.  His presentation is highly complicated.  Patient is at high risk of deteriorating.  Need to be treated in hospital for at least 2 days.          Date of Service 12/19/2020    Ivor Costa [facility]   If 7PM-7AM, please contact night-coverage [email or web address] 12/19/2020, 1:56 PM

## 2020-12-19 NOTE — ED Notes (Signed)
Date and time results received: 12/19/20 0447 (use smartphrase ".now" to insert current time)  Test: lactic acid Critical Value: 2.8  Name of Provider Notified: Dr. Leonides Schanz  Orders Received? Or Actions Taken?:  n/a

## 2020-12-19 NOTE — ED Notes (Signed)
No azithromycin in ed pyxis- pharmacy made aware

## 2020-12-19 NOTE — ED Provider Notes (Addendum)
[facility] Emergency Department Provider Note  ____________________________________________   Event Date/Time   First MD Initiated Contact with Patient 12/19/20 0345     (approximate)  I have reviewed the triage vital signs and the nursing notes.   HISTORY  Chief Complaint Cough and Shortness of Breath    HPI Richard Chapman is a 80 y.o. male with history of hypertension, diabetes, tobacco use who presents to the emergency department with complaints of chest tightness, shortness of breath, wheezing, nonproductive cough.  States he has 2 children that live with him that have had similar upper respiratory symptoms.  He is not aware that he has had any fever but was febrile on arrival to the ED.  No history of PE or DVT.  No diagnosis of asthma or COPD but wife reports he is a smoker.  Does not use any breathing treatments at home.        Past Medical History:  Diagnosis Date   Anxiety and depression    Depression    Diabetes ([place])    Elevated PSA    GERD (gastroesophageal reflux disease)    History of nephrolithiasis    Hypertension    Skin cancer    Sleep apnea     Patient Active Problem List   Diagnosis Date Noted   COPD exacerbation (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   CVA (cerebral infarction) 04/06/2015   Cerebral infarction ([place]) 04/06/2015   Malignant neoplasm of prostate ([place]) 01/29/2015   Cervical disc disease 12/03/2014   Chest pain 11/25/2014   Arthritis 08/20/2014   Benign fibroma of prostate 08/20/2014   Clinical depression 08/20/2014   Failure of erection 08/20/2014   Enlarged prostate 08/20/2014   Major depressive disorder with single episode 08/20/2014   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   Benign essential HTN  08/04/2014   Acute low back pain 04/23/2014   Alteration in bowel elimination: incontinence 04/23/2014   Urge incontinence 04/23/2014   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   Benign essential tremor 12/23/2013   B12 deficiency 08/13/2013   Chronic kidney disease (CKD), stage III (moderate) ([place]) 08/13/2013   Diabetes ([place]) 08/13/2013   Hypoglycemia 08/13/2013   Diabetes mellitus ([place]) 08/13/2013   Type 2 diabetes mellitus ([place]) 08/13/2013   Arteriosclerosis of coronary artery 07/24/2013   Fatigue 07/24/2013   Acid reflux 07/24/2013   Combined fat and carbohydrate induced hyperlipemia 07/24/2013   CAD in native artery 07/24/2013   Gastro-esophageal reflux disease without esophagitis 07/24/2013   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 Authorizing Provider  acetaminophen (TYLENOL) 325 MG tablet Take 2 tablets (650 mg total) every 6 (six)  hours as needed by mouth for mild pain (or Fever >/= 101). 12/18/16  Yes Gouru, Illene Silver, MD  amLODipine (NORVASC) 10 MG tablet Take 1 tablet (10 mg total) by mouth daily. 04/07/15  Yes Fritzi Mandes, MD  atorvastatin (LIPITOR) 10 MG tablet Take 5 mg by mouth daily.   Yes [provider]  cetirizine (ZYRTEC) 10 MG tablet Take 10 mg by mouth daily.   Yes [provider]  Cholecalciferol 25 MCG (1000 UT) tablet Take 1 tablet by mouth daily. 03/12/20  Yes [provider]   clopidogrel (PLAVIX) 75 MG tablet Take 1 tablet (75 mg total) by mouth daily. 04/07/15  Yes Fritzi Mandes, MD  escitalopram (LEXAPRO) 20 MG tablet Take 1 tablet (20 mg total) by mouth daily. Patient taking differently: Take 20 mg by mouth 2 (two) times daily. 11/26/14  Yes Gouru, Illene Silver, MD  esomeprazole (NEXIUM) 20 MG capsule Take 1 capsule (20 mg total) by mouth AC breakfast. 09/24/15  Yes Fritzi Mandes, MD  famotidine (PEPCID) 20 MG tablet Take 1 tablet by mouth at bedtime. 01/21/20  Yes [provider]  gabapentin (NEURONTIN) 300 MG capsule TAKE ONE CAPSULE BY MOUTH AT BEDTIME FOR NERVE PAIN 03/26/20  Yes [provider]  hyoscyamine (LEVBID) 0.375 MG 12 hr tablet Take 0.375 mg by mouth every 12 (twelve) hours.   Yes [provider]  insulin degludec (TRESIBA) 100 UNIT/ML SOPN FlexTouch Pen Inject 20 Units into the skin daily. 02/22/18  Yes [provider]  isosorbide mononitrate (IMDUR) 60 MG 24 hr tablet Take 1 tablet (60 mg total) by mouth daily. 04/19/18  Yes Wieting, Richard, MD  losartan (COZAAR) 100 MG tablet Take 100 mg by mouth daily.    Yes [provider]  magnesium oxide (MAG-OX) 400 MG tablet Take 400 mg by mouth daily.    Yes [provider]  nitroGLYCERIN (NITROSTAT) 0.4 MG SL tablet Place 0.4 mg under the tongue every 5 (five) minutes as needed for chest pain.    Yes [provider]  Nutritional Supplements (FEEDING SUPPLEMENT, GLUCERNA 1.2 CAL,) LIQD Take by mouth. 12/05/19  Yes [provider]  Roma Schanz test strip SMARTSIG:1 Each Via Meter Daily 03/03/20  Yes [provider]  primidone (MYSOLINE) 50 MG tablet Take 1 tablet by mouth 2 (two) times daily. 11/04/14  Yes [provider]  propranolol (INDERAL) 40 MG tablet TAKE ONE-HALF TABLET BY MOUTH TWO TIMES A DAY FOR BLOOD PRESSURE 02/25/20  Yes [provider]  tamsulosin (FLOMAX) 0.4 MG CAPS capsule Take 0.4 mg by mouth daily.    Yes  [provider]  traZODone (DESYREL) 100 MG tablet Take 1 tablet (100 mg total) by mouth at bedtime. 04/19/18  Yes Wieting, Richard, MD  vitamin B-12 (CYANOCOBALAMIN) 1000 MCG tablet Take 1,000 mcg by mouth daily.    Yes [provider]  white petrolatum (VASELINE) OINT Apply 1 application topically as needed (Wound care). 04/19/18  Yes Wieting, Richard, MD  docusate sodium (COLACE) 100 MG capsule Take 1 capsule (100 mg total) by mouth 2 (two) times daily. Patient not taking: Reported on 12/19/2020 04/19/18   Loletha Grayer, MD    Allergies Lisinopril  Family History  Problem Relation Age of Onset   Heart disease Mother    Heart disease Father    Diabetes Father    Stroke Father    Kidney disease Neg Hx    Prostate cancer Neg Hx     Social History Social History  Tobacco Use   Smoking status: Every Day    Packs/day: 1.00    Types: Cigarettes   Smokeless tobacco: Never  Vaping Use   Vaping Use: Never used  Substance Use Topics   Alcohol use: Yes    Alcohol/week: 2.0 standard drinks    Types: 2 Standard drinks or equivalent per week   Drug use: No    Review of Systems Constitutional: No fever. Eyes: No visual changes. ENT: No sore throat. Cardiovascular: + chest pain. Respiratory: + shortness of breath. Gastrointestinal: No nausea, vomiting, diarrhea. Genitourinary: Negative for dysuria. Musculoskeletal: Negative for back pain. Skin: Negative for rash. Neurological: Negative for focal weakness or numbness.  ____________________________________________   PHYSICAL EXAM:  VITAL SIGNS: ED Triage Vitals  Enc Vitals Group     BP 12/18/20 1348 128/61     Pulse Rate 12/18/20 1343 (!) 59     Resp 12/18/20 1343 18     Temp 12/18/20 1343 (!) 100.9 F (38.3 C)     Temp Source 12/18/20 1343 Oral     SpO2 12/18/20 1343 100 %     Weight --      Height 12/18/20 1346 5\' 8"  (1.727 m)     Head Circumference --      Peak Flow --      Pain Score 12/18/20  1343 8     Pain Loc --      Pain Edu? --      Excl. in [place]? --    CONSTITUTIONAL: Alert and oriented and responds appropriately to questions.  Elderly, pleasant, nontoxic HEAD: Normocephalic EYES: Conjunctivae clear, pupils appear equal, EOM appear intact ENT: normal nose; moist mucous membranes NECK: Supple, normal ROM CARD: RRR; S1 and S2 appreciated; no murmurs, no clicks, no rubs, no gallops RESP: Diffuse rhonchorous breath sounds with inspiratory and expiratory wheezing.  No rales.  No hypoxia on room air at rest.  Speaking full sentences.  No respiratory distress. ABD/GI: Normal bowel sounds; non-distended; soft, non-tender, no rebound, no guarding, no peritoneal signs, no hepatosplenomegaly BACK: The back appears normal EXT: Normal ROM in all joints; no deformity noted, no edema; no cyanosis, no calf swelling or calf tenderness SKIN: Normal color for age and race; warm; no rash on exposed skin NEURO: Moves all extremities equally PSYCH: The patient's mood and manner are appropriate.  ____________________________________________   LABS (all labs ordered are listed, but only abnormal results are displayed)  Labs Reviewed  COMPREHENSIVE METABOLIC PANEL - Abnormal; Notable for the following components:      Result Value   Sodium 130 (*)    CO2 21 (*)    Glucose, Bld 212 (*)    BUN 36 (*)    Creatinine, Ser 1.79 (*)    Calcium 8.1 (*)    Total Protein 6.1 (*)    Albumin 3.0 (*)    GFR, Estimated 37 (*)    All other components within normal limits  BRAIN NATRIURETIC PEPTIDE - Abnormal; Notable for the following components:   B Natriuretic Peptide 172.3 (*)    All other components within normal limits  LACTIC ACID, PLASMA - Abnormal; Notable for the following components:   Lactic Acid, Venous 2.8 (*)    All other components within normal limits  CBC WITH DIFFERENTIAL/PLATELET - Abnormal; Notable for the following components:   WBC 13.2 (*)    Platelets 115 (*)    Neutro  Abs 8.8 (*)    Monocytes Absolute 1.2 (*)    All other  components within normal limits  URINALYSIS, COMPLETE (UACMP) WITH MICROSCOPIC - Abnormal; Notable for the following components:   Color, Urine YELLOW (*)    APPearance HAZY (*)    Glucose, UA 150 (*)    Hgb urine dipstick MODERATE (*)    Protein, ur >=300 (*)    All other components within normal limits  TROPONIN I (HIGH SENSITIVITY) - Abnormal; Notable for the following components:   Troponin I (High Sensitivity) 33 (*)    All other components within normal limits  TROPONIN I (HIGH SENSITIVITY) - Abnormal; Notable for the following components:   Troponin I (High Sensitivity) 25 (*)    All other components within normal limits  TROPONIN I (HIGH SENSITIVITY) - Abnormal; Notable for the following components:   Troponin I (High Sensitivity) 24 (*)    All other components within normal limits  RESP PANEL BY RT-PCR (FLU A&B, COVID) ARPGX2  CULTURE, BLOOD (ROUTINE X 2)  CULTURE, BLOOD (ROUTINE X 2)  RESPIRATORY PANEL BY PCR  URINE CULTURE  LACTIC ACID, PLASMA  PROCALCITONIN  TROPONIN I (HIGH SENSITIVITY)   ____________________________________________  EKG   EKG Interpretation  Date/Time:  Saturday December 18 2020 13:38:10 EST Ventricular Rate:  64 PR Interval:    QRS Duration: 136 QT Interval:  464 QTC Calculation: 478 R Axis:   -54 Text Interpretation: Normal sinus rhythm Right bundle branch block Left anterior fascicular block Minimal voltage criteria for LVH, may be normal variant ( R in aVL ) Septal infarct , age undetermined Abnormal ECG Confirmed by Pryor Curia [phone]) on 12/19/2020 3:46:36 AM        ____________________________________________  RADIOLOGY Jessie Foot Valisha Heslin, personally viewed and evaluated these images (plain radiographs) as part of my medical decision making, as well as reviewing the written report by the radiologist.  ED MD interpretation: Chest x-ray shows bronchitic changes.  No  infiltrate.  Official radiology report(s): DG Chest 2 View  Result Date: 12/18/2020 CLINICAL DATA:  Cough and wheezing. EXAM: CHEST - 2 VIEW COMPARISON:  April 14, 2018 FINDINGS: Calcific atherosclerotic disease and tortuosity of the aorta. Cardiomediastinal silhouette is normal. Mediastinal contours appear intact. There is no evidence of focal airspace consolidation, pleural effusion or pneumothorax. Bronchitic changes and mild exaggeration of the interstitial markings which may be partially due to hypoinflation. Osseous structures are without acute abnormality. Soft tissues are grossly normal. IMPRESSION: Bronchitic changes and mild exaggeration of the interstitial markings which may be partially due to hypoinflation. Electronically Signed   By: Fidela Salisbury M.D.   On: 12/18/2020 15:04    ____________________________________________   PROCEDURES  Procedure(s) performed (including Critical Care):  Procedures   ____________________________________________   INITIAL IMPRESSION / ASSESSMENT AND PLAN / ED COURSE  As part of my medical decision making, I reviewed the following data within the [place] History obtained from family, Nursing notes reviewed and incorporated, Labs reviewed , EKG interpreted , Old EKG reviewed, Old chart reviewed, Radiograph reviewed , Discussed with admitting physician , and Notes from prior ED visits         Patient here with likely viral URI causing bronchitis, bronchospasm.  Not currently hypoxic but does have rhonchorous breath sounds and diffuse inspiratory and expiratory wheezing on exam.  Will give duo nebs, Solu-Medrol for symptomatic relief and reassess.  Labs obtained in triage showed mild hyponatremia, chronic kidney disease which appears stable, mildly elevated troponin and BNP, leukocytosis of 13,000 with left shift.  Checks x-ray reviewed by myself and radiology  shows bronchitic changes but no infiltrate or edema.  Repeat  troponin, procalcitonin, lactic pending.  Will also obtain blood cultures, urine and urine culture given patient is febrile.  Discussed with patient that he may need admission to the hospital.  COVID and flu swabs negative.  Respiratory viral panel pending.  ED PROGRESS  4:55 AM  Patient's lactic is elevated at 2.8.  This may be more due to his respiratory status than sepsis.  Procalcitonin level is still pending.  We will give IV fluids.  Last echocardiogram in 2020 showed normal ejection fraction.  5:35 AM  Pt reports feeling better but still significantly wheezing with rhonchorous breath sounds after 3 DuoNeb treatments.  Will discuss with hospitalist for admission.  5:50 AM Discussed patient's case with hospitalist, Dr. Sidney Ace.  I have recommended admission and patient (and family if present) agree with this plan. Admitting physician will place admission orders.   I reviewed all nursing notes, vitals, pertinent previous records and reviewed/interpreted all EKGs, lab and urine results, imaging (as available).   6:27 AM  Pt's procalcitonin is slightly elevated.  Given concerns for possible bacterial infection now will give ceftriaxone and azithromycin.  Repeat lactic pending.  Will give second liter of IV fluids.  Continues to be hemodynamically stable.  Troponin slightly elevated but downtrending. ____________________________________________   FINAL CLINICAL IMPRESSION(S) / ED DIAGNOSES  Final diagnoses:  Acute bronchitis with bronchospasm  Viral URI     ED Discharge Orders     None       *Please note:  Aristides Gene Doubek was evaluated in Emergency Department on 12/19/2020 for the symptoms described in the history of present illness. He was evaluated in the context of the global COVID-19 pandemic, which necessitated consideration that the patient might be at risk for infection with the SARS-CoV-2 virus that causes COVID-19. Institutional protocols and algorithms that pertain to the  evaluation of patients at risk for COVID-19 are in a state of rapid change based on information released by regulatory bodies including the CDC and federal and state organizations. These policies and algorithms were followed during the patient's care in the ED.  Some ED evaluations and interventions may be delayed as a result of limited staffing during and the pandemic.*   Note:  This document was prepared using Dragon voice recognition software and may include unintentional dictation errors.    Kazaria Gaertner, Delice Bison, DO 12/19/20 [place], Delice Bison, DO 12/19/20 [phone]

## 2020-12-19 NOTE — ED Notes (Signed)
Pt requesting something for pain- will await admission orders

## 2020-12-20 DIAGNOSIS — J441 Chronic obstructive pulmonary disease with (acute) exacerbation: Secondary | ICD-10-CM | POA: Diagnosis not present

## 2020-12-20 LAB — BASIC METABOLIC PANEL
Anion gap: 8 (ref 5–15)
BUN: 40 mg/dL — ABNORMAL HIGH (ref 8–23)
CO2: 21 mmol/L — ABNORMAL LOW (ref 22–32)
Calcium: 7.9 mg/dL — ABNORMAL LOW (ref 8.9–10.3)
Chloride: 106 mmol/L (ref 98–111)
Creatinine, Ser: 1.6 mg/dL — ABNORMAL HIGH (ref 0.61–1.24)
GFR, Estimated: 43 mL/min — ABNORMAL LOW (ref >60)
Glucose, Bld: 268 mg/dL — ABNORMAL HIGH (ref 70–99)
Potassium: 3.5 mmol/L (ref 3.5–5.1)
Sodium: 135 mmol/L (ref 135–145)

## 2020-12-20 LAB — CBC
HCT: 35 % — ABNORMAL LOW (ref 39.0–52.0)
Hemoglobin: 12.4 g/dL — ABNORMAL LOW (ref 13.0–17.0)
MCH: 30.2 pg (ref 26.0–34.0)
MCHC: 35.4 g/dL (ref 30.0–36.0)
MCV: 85.4 fL (ref 80.0–100.0)
Platelets: 128 10*3/uL — ABNORMAL LOW (ref 150–400)
RBC: 4.1 MIL/uL — ABNORMAL LOW (ref 4.22–5.81)
RDW: 14 % (ref 11.5–15.5)
WBC: 9.6 10*3/uL (ref 4.0–10.5)
nRBC: 0 % (ref 0.0–0.2)

## 2020-12-20 LAB — LIPID PANEL
Cholesterol: 138 mg/dL (ref 0–200)
HDL: 38 mg/dL — ABNORMAL LOW (ref >40)
LDL Cholesterol: 85 mg/dL (ref 0–99)
Total CHOL/HDL Ratio: 3.6 RATIO
Triglycerides: 76 mg/dL (ref <150)
VLDL: 15 mg/dL (ref 0–40)

## 2020-12-20 LAB — PROTIME-INR
INR: 1 (ref 0.8–1.2)
Prothrombin Time: 13.3 seconds (ref 11.4–15.2)

## 2020-12-20 LAB — URINE CULTURE: Culture: NO GROWTH

## 2020-12-20 LAB — LACTATE DEHYDROGENASE: LDH: 160 U/L (ref 98–192)

## 2020-12-20 LAB — PATHOLOGIST SMEAR REVIEW

## 2020-12-20 LAB — APTT: aPTT: 26 seconds (ref 24–36)

## 2020-12-20 LAB — HEMOGLOBIN A1C
Hgb A1c MFr Bld: 9 % — ABNORMAL HIGH (ref 4.8–5.6)
Mean Plasma Glucose: 211.6 mg/dL

## 2020-12-20 LAB — GLUCOSE, CAPILLARY
Glucose-Capillary: 246 mg/dL — ABNORMAL HIGH (ref 70–99)
Glucose-Capillary: 296 mg/dL — ABNORMAL HIGH (ref 70–99)

## 2020-12-20 LAB — SAVE SMEAR(SSMR), FOR PROVIDER SLIDE REVIEW

## 2020-12-20 LAB — LACTIC ACID, PLASMA: Lactic Acid, Venous: 1 mmol/L (ref 0.5–1.9)

## 2020-12-20 MED ORDER — LOSARTAN POTASSIUM 50 MG PO TABS
100.0000 mg | ORAL_TABLET | Freq: Every day | ORAL | Status: DC
  Administered 2020-12-20: 100 mg via ORAL
  Filled 2020-12-20: qty 2

## 2020-12-20 MED ORDER — COMBIVENT RESPIMAT 20-100 MCG/ACT IN AERS: 1.0000 | INHALATION_SPRAY | Freq: Four times a day (QID) | RESPIRATORY_TRACT | 1 refills | Status: AC

## 2020-12-20 MED ORDER — DM-GUAIFENESIN ER 30-600 MG PO TB12
1.0000 | ORAL_TABLET | Freq: Two times a day (BID) | ORAL | 0 refills | Status: DC | PRN
Start: 2020-12-20 — End: 2021-10-07

## 2020-12-20 MED ORDER — LEVOFLOXACIN 500 MG PO TABS: 500.0000 mg | ORAL_TABLET | Freq: Every day | ORAL | 0 refills | Status: AC

## 2020-12-20 MED ORDER — IPRATROPIUM-ALBUTEROL 0.5-2.5 (3) MG/3ML IN SOLN
3.0000 mL | Freq: Four times a day (QID) | RESPIRATORY_TRACT | Status: DC
  Administered 2020-12-20: 3 mL via RESPIRATORY_TRACT
  Filled 2020-12-20: qty 3

## 2020-12-20 NOTE — TOC Initial Note (Addendum)
Transition of Care [facility]) - Initial/Assessment Note    Patient Details  Name: Richard Chapman MRN: [id number] Date of Birth: 1940/10/15  [facility] CM/SW Contact:    Alberteen Sam, LCSW Phone Number: 12/20/2020, 2:51 PM  Clinical Narrative:                  CSW spoke with patient and patient's wife regarding home health recommendations, they are in agreement with no preference of agency.   CSW spoke with Corene Cornea with [facility], he said they can accept pending VA auth. CSW has  Technical brewer and referrals to [employer] with [place].   No other discharge needs identified.   Expected Discharge Plan: [place] Barriers to Discharge: No Barriers Identified   Patient Goals and CMS Choice Patient states their goals for this hospitalization and ongoing recovery are:: to go home CMS Medicare.gov Compare Post Acute Care list provided to:: Patient Choice offered to / list presented to : Patient  Expected Discharge Plan and Services Expected Discharge Plan: [place]       Living arrangements for the past 2 months: Single Family Home Expected Discharge Date: 12/20/20                         HH Arranged: PT, RN [place] Agency: [place] Date [facility] Agency Contacted: 12/20/20 Time [place]: 50 Representative spoke with at [place]: Corene Cornea  Prior Living Arrangements/Services Living arrangements for the past 2 months: Single Family Home Lives with:: Spouse   Do you feel safe going back to the place where you live?: Yes               Activities of Daily Living Home Assistive Devices/Equipment: None ADL Screening (condition at time of admission) Patient's cognitive ability adequate to safely complete daily activities?: Yes Is the patient deaf or have difficulty hearing?: Yes Does the patient have difficulty seeing, even when wearing glasses/contacts?: No Does the patient have difficulty concentrating,  remembering, or making decisions?: No Patient able to express need for assistance with ADLs?: Yes Does the patient have difficulty dressing or bathing?: No Independently performs ADLs?: Yes (appropriate for developmental age) Does the patient have difficulty walking or climbing stairs?: No Weakness of Legs: None Weakness of Arms/Hands: None  Permission Sought/Granted                  Emotional Assessment Appearance:: Appears stated age Attitude/Demeanor/Rapport: Gracious Affect (typically observed): Calm Orientation: : Oriented to Self, Oriented to Place, Oriented to  Time, Oriented to Situation Alcohol / Substance Use: Not Applicable Psych Involvement: No (comment)  Admission diagnosis:  Acute bronchitis with bronchospasm [J20.9] Viral URI [J06.9] COPD exacerbation ([place]) [J44.1] Patient Active Problem List   Diagnosis Date Noted   COPD exacerbation ([place]) 12/19/2020   Acute bronchitis 12/19/2020   CKD (chronic kidney disease), stage IIIb 12/19/2020   Type II diabetes mellitus with renal manifestations ([place]) 12/19/2020   HTN (hypertension) 12/19/2020   HLD (hyperlipidemia) 12/19/2020   Tobacco abuse 12/19/2020   Thrombocytopenia ([place]) 12/19/2020   Elevated troponin 12/19/2020   Diarrhea 12/19/2020   Sepsis due to pneumonia ([place]) 04/14/2018   Bacteremia 12/15/2016   Severe sepsis ([place]) 12/14/2016   Atypical chest pain 09/24/2015   Elevated WBC count 05/05/2015   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   Stroke ([place]) 04/06/2015   Cerebral infarction ([place]) 04/06/2015   Malignant neoplasm of  prostate ([place]) 01/29/2015   Cervical disc disease 12/03/2014   Chest pain 11/25/2014   Arthritis 08/20/2014   Benign fibroma of prostate 08/20/2014   Clinical depression 08/20/2014   Failure of erection 08/20/2014   Enlarged prostate 08/20/2014   Major depressive disorder with single episode 08/20/2014   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   Benign  essential HTN 08/04/2014   Acute low back pain 04/23/2014   Alteration in bowel elimination: incontinence 04/23/2014   Urge incontinence 04/23/2014   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   Benign essential tremor 12/23/2013   B12 deficiency 08/13/2013   Chronic kidney disease (CKD), stage III (moderate) ([place]) 08/13/2013   Diabetes ([place]) 08/13/2013   Hypoglycemia 08/13/2013   Diabetes mellitus ([place]) 08/13/2013   Type 2 diabetes mellitus ([place]) 08/13/2013   Arteriosclerosis of coronary artery 07/24/2013   Fatigue 07/24/2013   Acid reflux 07/24/2013   Combined fat and carbohydrate induced hyperlipemia 07/24/2013   CAD in native artery 07/24/2013   Gastro-esophageal reflux disease without esophagitis 07/24/2013   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   Subdural hematoma ([place]) 03/26/2013   Traumatic subdural hemorrhage ([place]) 03/26/2013   Abnormal prostate specific antigen 09/01/2012   Benign prostatic hyperplasia with urinary obstruction 09/01/2012   Elevated prostate specific antigen (PSA) 09/01/2012   PCP:  Leonel Ramsay, MD Pharmacy:   [facility] - [place], [address] [place] [address] Phone: [phone] Fax: [phone]  [facility] [place] [place] [facility] [address] [place] [address] Phone: [phone] Fax: [phone]  [id number] [place], [address] [place] [address] Phone: [phone] Fax: [phone]     Social Determinants of Health (SDOH) Interventions    Readmission Risk Interventions No flowsheet data found.

## 2020-12-20 NOTE — Progress Notes (Signed)
Inpatient Diabetes Program Recommendations  AACE/ADA: New Consensus Statement on Inpatient Glycemic Control (2015)  Target Ranges:  Prepandial:   less than 140 mg/dL      Peak postprandial:   less than 180 mg/dL (1-2 hours)      Critically ill patients:  140 - 180 mg/dL   Results for MATTSON, DAYAL (MRN [id number]) as of 12/20/2020 12:56  Ref. Range 12/20/2020 07:48 12/20/2020 11:25  Glucose-Capillary Latest Ref Range: 70 - 99 mg/dL 246 (H)  3 units Novolog  15 units Semglee 296 (H)      Home DM Meds: Tresiba 20 units daily  Current Orders: Semglee 15 units daily Novolog 0-9 units TID ac/hs     Of note, pt got 125 mg Soluemdrol X 1 dose yest at 4am and another 40 mg Solumedrol X 1 dose yest at 5pm.  No more steroids ordered this AM.  CBGs >200 so far today   MD- Please consider:  1. Increase Semglee to 20 units Daily (home dose)  2. Start Novolog Meal Coverage: Novolog 4 units TID with meals     --Will follow patient during hospitalization--  Wyn Quaker RN, MSN, CDE Diabetes Coordinator Inpatient Glycemic Control Team Team Pager: [phone] (8a-5p)

## 2020-12-20 NOTE — Evaluation (Signed)
Physical Therapy Evaluation Patient Details Name: Richard Chapman MRN: [id number] DOB: [birth year] Today's Date: 12/20/2020  History of Present Illness  Richard Chapman is an 53yoM who comes to [facility] on 11/5 with SOB, CP x1 week. PMH: HTN, HLd, DM, stroke, GERD, depression/GAD, OSA not on CPAP, HOH, SDH, PrCA, BPH, CKD3b, CAD. Workup showing BNP 1733, negative COVID test. RVP is positive for metapneumovirus pt on droplet precautions.  Clinical Impression  Pt admitted with above diagnosis. Pt currently with functional limitations due to the deficits listed below (see "PT Problem List"). Upon entry, pt in bed, awake and agreeable to participate. The pt is alert, pleasant, interactive. PLOF taken from wife who is able to provide info regarding prior level of function, both in tolerance and independence. Pt requires minA physical assist to perform all basic mobility required for ADL performance at home. Pt is very near baseline, only some subjective weakness remaining. Pt has no pain or dyspnea in session, VSS with activity. Pt is on room air. Patient's performance this date reveals decreased ability, independence, and tolerance in performing all basic mobility required for performance of activities of daily living. Pt requires additional DME, close physical assistance, and cues for safe participate in mobility. Pt will benefit from skilled PT intervention to increase independence and safety with basic mobility in preparation for discharge to the venue listed below.        Recommendations for follow up therapy are one component of a multi-disciplinary discharge planning process, led by the attending physician.  Recommendations may be updated based on patient status, additional functional criteria and insurance authorization.  Follow Up Recommendations Home health PT    Assistance Recommended at Discharge Intermittent Supervision/Assistance  Functional Status Assessment Patient has had a recent decline in  their functional status and demonstrates the ability to make significant improvements in function in a reasonable and predictable amount of time.  Equipment Recommendations  None recommended by PT    Recommendations for Other Services       Precautions / Restrictions Precautions Precautions: Fall Restrictions Weight Bearing Restrictions: No      Mobility  Bed Mobility Overal bed mobility: Needs Assistance Bed Mobility: Supine to Sit     Supine to sit: Min assist;Min guard     General bed mobility comments: moderate weakness    Transfers Overall transfer level: Needs assistance Equipment used: Rolling walker (2 wheels)               General transfer comment: max effort from bed height, multipole attempts; provided minA 2nd time for energy conservation    Ambulation/Gait Ambulation/Gait assistance: Min guard Gait Distance (Feet): 80 Feet (37ft, then 66ft out of room) Assistive device: Rolling walker (2 wheels)         General Gait Details: fairly fluent and safe use of RW, 1 LOB during turning in room, otherwise appears well controlled, low exertion, VSS.  Stairs            Wheelchair Mobility    Modified Rankin (Stroke Patients Only)       Balance                                             Pertinent Vitals/Pain Pain Assessment: 0-10    Home Living Family/patient expects to be discharged to:: Private residence Living Arrangements: Spouse/significant other;Children (Son lives there, works first [employer] (medical); granddaughter  and 2 great grandchildren (64yo, 48yo)) Available Help at Discharge: Family;Friend(s) Type of Home: House Home Access: Stairs to enter Entrance Stairs-Rails: Right Entrance Stairs-Number of Steps: 1   Home Layout: One level Home Equipment: Cane - single Barista (2 wheels) Additional Comments: reports 2 recent retropulsive falls in kitchen trying to perform bimnaul tasks without walker    Prior  Function Prior Level of Function : Needs assist               ADLs Comments: requires max assist with dressing, bathing; smoker     Hand Dominance        Extremity/Trunk Assessment                Communication      Cognition Arousal/Alertness: Awake/alert Behavior During Therapy: WFL for tasks assessed/performed Overall Cognitive Status: History of cognitive impairments - at baseline                                          General Comments      Exercises     Assessment/Plan    PT Assessment Patient needs continued PT services  PT Problem List Decreased strength;Decreased activity tolerance;Decreased balance;Decreased mobility;Decreased safety awareness;Decreased knowledge of precautions;Cardiopulmonary status limiting activity       PT Treatment Interventions Gait training;Functional mobility training;Therapeutic activities;Therapeutic exercise;Balance training;Cognitive remediation;Patient/family education    PT Goals (Current goals can be found in the Care Plan section)  Acute Rehab PT Goals Patient Stated Goal: regain baseline mobility in AMB and transfers PT Goal Formulation: With patient Time For Goal Achievement: 01/03/21 Potential to Achieve Goals: Good    Frequency Min 2X/week   Barriers to discharge        Co-evaluation               AM-PAC PT "6 Clicks" Mobility  Outcome Measure Help needed turning from your back to your side while in a flat bed without using bedrails?: A Little Help needed moving from lying on your back to sitting on the side of a flat bed without using bedrails?: A Little Help needed moving to and from a bed to a chair (including a wheelchair)?: A Little Help needed standing up from a chair using your arms (e.g., wheelchair or bedside chair)?: A Little Help needed to walk in hospital room?: A Little Help needed climbing 3-5 steps with a railing? : A Lot 6 Click Score: 17    End of Session  Equipment Utilized During Treatment: Gait belt Activity Tolerance: Patient tolerated treatment well;No increased pain Patient left: in bed;with family/visitor present;with call bell/phone within reach;with bed alarm set Nurse Communication: Mobility status PT Visit Diagnosis: Unsteadiness on feet (R26.81);Muscle weakness (generalized) (M62.81);Difficulty in walking, not elsewhere classified (R26.2)    Time: 9357-0177 PT Time Calculation (min) (ACUTE ONLY): 27 min   Charges:   PT Evaluation $PT Eval Moderate Complexity: 1 Mod PT Treatments $Therapeutic Exercise: 8-22 mins       2:06 PM, 12/20/20 Etta Grandchild, PT, DPT Physical Therapist - [facility]  [phone] ([place])    Anasha Perfecto C 12/20/2020, 2:04 PM

## 2020-12-20 NOTE — Discharge Summary (Signed)
Physician Discharge Summary  Richard Chapman NID:[id number] DOB: 1940-04-29 DOA: 12/19/2020  PCP: Leonel Ramsay, MD  Admit date: 12/19/2020 Discharge date: 12/20/2020  Admitted From: Home Disposition: Home  Recommendations for Outpatient Follow-up:  Follow up with PCP in 1-2 weeks Please obtain BMP/CBC in one week Please follow up on the following pending results: None  Home Health: Yes Equipment/Devices: Rolling walker Discharge Condition: Stable CODE STATUS: DNR Diet recommendation: Heart Healthy / Carb Modified    Brief/Interim Summary:  Richard Chapman is a 80 y.o. male with medical history significant of hypertension, hyperlipidemia, diabetes mellitus, stroke, GERD, depression with anxiety, OSA not on CPAP, hard of hearing, remote subdural hematoma, prostate cancer, BPH, CKD stage IIIb, CAD, tobacco abuse, essential tremor, who presents with progressively worsening shortness breath for 1 week.  Patient had 3 family members for similar upper respiratory symptoms. Had mild leukocytosis which has been resolved.  Mild lactic acidosis resolved.  Saturating well on room air.  Respiratory viral panel came back positive for metapneumovirus.  Procalcitonin at 0.18 so he received ceftriaxone and azithromycin while in the hospital and discharged on 3 days of Levaquin.  Chest x-ray showed  bronchitic change and increased interstitial marking.  Barely positive troponin with a flat curve. Patient has an history of CKD stage IIIb and creatinine seems stable around baseline.  Patient also had poorly controlled diabetes mellitus with renal manifestations.  A1c of 7.4.  He was on [place] at home and will continue with current management and follow-up with his primary care provider for further recommendations.  Patient was found to have mild thrombocytopenia with platelet at 115 and started improving to 128 on the day of discharge.  Most likely secondary to recent viral infection.  GI pathogen was  ordered due to his concern of diarrhea but he did not had any bowel movement since in the hospital.  No need to do C. difficile or GI pathogen at this time.  Patient will continue the rest of his home medications and follow-up with his providers.  Discharge Diagnoses:  Principal Problem:   COPD exacerbation ([place]) Active Problems:   Clinical depression   Chest pain   CAD in native artery   Enlarged prostate   Gastro-esophageal reflux disease without esophagitis   Stroke (HCC)   Severe sepsis (HCC)   Acute bronchitis   CKD (chronic kidney disease), stage IIIb   Type II diabetes mellitus with renal manifestations (HCC)   HTN (hypertension)   HLD (hyperlipidemia)   Tobacco abuse   Thrombocytopenia (HCC)   Elevated troponin   Diarrhea   Discharge Instructions  Discharge Instructions     Diet - low sodium heart healthy   Complete by: As directed    Discharge instructions   Complete by: As directed    It was pleasure taking care of you. You are being given 3 more days of antibiotics and an inhaler to be used every 6 hourly for the first few days and then only as needed. Please keep yourself well-hydrated and follow-up with your primary care doctor.   Increase activity slowly   Complete by: As directed       Allergies as of 12/20/2020       Reactions   Lisinopril Other (See Comments), Cough        Medication List     TAKE these medications    acetaminophen 325 MG tablet Commonly known as: TYLENOL Take 2 tablets (650 mg total) every 6 (six) hours as needed by  mouth for mild pain (or Fever >/= 101).   amLODipine 10 MG tablet Commonly known as: NORVASC Take 1 tablet (10 mg total) by mouth daily.   atorvastatin 10 MG tablet Commonly known as: LIPITOR Take 5 mg by mouth daily.   cetirizine 10 MG tablet Commonly known as: ZYRTEC Take 10 mg by mouth daily.   Cholecalciferol 25 MCG (1000 UT) tablet Take 1 tablet by mouth daily.   clopidogrel 75 MG  tablet Commonly known as: PLAVIX Take 1 tablet (75 mg total) by mouth daily.   Combivent Respimat 20-100 MCG/ACT Aers respimat Generic drug: Ipratropium-Albuterol Inhale 1 puff into the lungs every 6 (six) hours.   dextromethorphan-guaiFENesin 30-600 MG 12hr tablet Commonly known as: MUCINEX DM Take 1 tablet by mouth 2 (two) times daily as needed for cough.   docusate sodium 100 MG capsule Commonly known as: COLACE Take 1 capsule (100 mg total) by mouth 2 (two) times daily.   escitalopram 20 MG tablet Commonly known as: LEXAPRO Take 1 tablet (20 mg total) by mouth daily. What changed: when to take this   esomeprazole 20 MG capsule Commonly known as: NEXIUM Take 1 capsule (20 mg total) by mouth AC breakfast.   famotidine 20 MG tablet Commonly known as: PEPCID Take 1 tablet by mouth at bedtime.   feeding supplement (GLUCERNA 1.2 CAL) Liqd Take by mouth.   gabapentin 300 MG capsule Commonly known as: NEURONTIN TAKE ONE CAPSULE BY MOUTH AT BEDTIME FOR NERVE PAIN   hyoscyamine 0.375 MG 12 hr tablet Commonly known as: LEVBID Take 0.375 mg by mouth every 12 (twelve) hours.   insulin degludec 100 UNIT/ML FlexTouch Pen Commonly known as: TRESIBA Inject 20 Units into the skin daily.   isosorbide mononitrate 60 MG 24 hr tablet Commonly known as: IMDUR Take 1 tablet (60 mg total) by mouth daily.   levofloxacin 500 MG tablet Commonly known as: Levaquin Take 1 tablet (500 mg total) by mouth daily for 3 days.   losartan 100 MG tablet Commonly known as: COZAAR Take 100 mg by mouth daily.   magnesium oxide 400 MG tablet Commonly known as: MAG-OX Take 400 mg by mouth daily.   nitroGLYCERIN 0.4 MG SL tablet Commonly known as: NITROSTAT Place 0.4 mg under the tongue every 5 (five) minutes as needed for chest pain.   OneTouch Verio test strip Generic drug: glucose blood SMARTSIG:1 Each Via Meter Daily   primidone 50 MG tablet Commonly known as: MYSOLINE Take 1 tablet  by mouth 2 (two) times daily.   propranolol 40 MG tablet Commonly known as: INDERAL TAKE ONE-HALF TABLET BY MOUTH TWO TIMES A DAY FOR BLOOD PRESSURE   tamsulosin 0.4 MG Caps capsule Commonly known as: FLOMAX Take 0.4 mg by mouth daily.   traZODone 100 MG tablet Commonly known as: DESYREL Take 1 tablet (100 mg total) by mouth at bedtime.   vitamin B-12 1000 MCG tablet Commonly known as: CYANOCOBALAMIN Take 1,000 mcg by mouth daily.   white petrolatum Oint Commonly known as: VASELINE Apply 1 application topically as needed (Wound care).        Follow-up Information     Leonel Ramsay, MD. Schedule an appointment as soon as possible for a visit in 1 week(s).   Specialty: Infectious Diseases Contact information: [place] [address] [phone]                Allergies  Allergen Reactions   Lisinopril Other (See Comments) and Cough  Consultations: None  Procedures/Studies: DG Chest 2 View  Result Date: 12/18/2020 CLINICAL DATA:  Cough and wheezing. EXAM: CHEST - 2 VIEW COMPARISON:  April 14, 2018 FINDINGS: Calcific atherosclerotic disease and tortuosity of the aorta. Cardiomediastinal silhouette is normal. Mediastinal contours appear intact. There is no evidence of focal airspace consolidation, pleural effusion or pneumothorax. Bronchitic changes and mild exaggeration of the interstitial markings which may be partially due to hypoinflation. Osseous structures are without acute abnormality. Soft tissues are grossly normal. IMPRESSION: Bronchitic changes and mild exaggeration of the interstitial markings which may be partially due to hypoinflation. Electronically Signed   By: Fidela Salisbury M.D.   On: 12/18/2020 15:04    Subjective: Patient was seen and examined today.  Feeling little weak but getting closer to baseline.  No significant upper respiratory symptoms.  Wife at bedside.  Discharge Exam: Vitals:   12/20/20 0856  12/20/20 1137  BP:  128/61  Pulse: 62 (!) 59  Resp: 18 18  Temp:  97.9 F (36.6 C)  SpO2: 96% 96%   Vitals:   12/20/20 0520 12/20/20 0746 12/20/20 0856 12/20/20 1137  BP:  (!) 155/66  128/61  Pulse:  63 62 (!) 59  Resp:  18 18 18   Temp:  98 F (36.7 C)  97.9 F (36.6 C)  TempSrc:      SpO2:  99% 96% 96%  Weight: 78.4 kg     Height: 5\' 8"  (1.727 m)       General: Pt is alert, awake, not in acute distress Cardiovascular: RRR, S1/S2 +, no rubs, no gallops Respiratory: CTA bilaterally, no wheezing, no rhonchi Abdominal: Soft, NT, ND, bowel sounds + Extremities: no edema, no cyanosis   The results of significant diagnostics from this hospitalization (including imaging, microbiology, ancillary and laboratory) are listed below for reference.    Microbiology: Recent Results (from the past 240 hour(s))  Resp Panel by RT-PCR (Flu A&B, Covid) Nasopharyngeal Swab     Status: None   Collection Time: 12/18/20  2:09 PM   Specimen: Nasopharyngeal Swab; Nasopharyngeal(NP) swabs in vial transport medium  Result Value Ref Range Status   SARS Coronavirus 2 by RT PCR NEGATIVE NEGATIVE Final    Comment: (NOTE) SARS-CoV-2 target nucleic acids are NOT DETECTED.  The SARS-CoV-2 RNA is generally detectable in upper respiratory specimens during the acute phase of infection. The lowest concentration of SARS-CoV-2 viral copies this assay can detect is 138 copies/mL. A negative result does not preclude SARS-Cov-2 infection and should not be used as the sole basis for treatment or other patient management decisions. A negative result may occur with  improper specimen collection/handling, submission of specimen other than nasopharyngeal swab, presence of viral mutation(s) within the areas targeted by this assay, and inadequate number of viral copies(<138 copies/mL). A negative result must be combined with clinical observations, patient history, and epidemiological information. The expected result  is Negative.  Fact Sheet for Patients:  EntrepreneurPulse.com.au  Fact Sheet for Healthcare Providers:  IncredibleEmployment.be  This test is no t yet approved or cleared by the [place] FDA and  has been authorized for detection and/or diagnosis of SARS-CoV-2 by FDA under an Emergency Use Authorization (EUA). This EUA will remain  in effect (meaning this test can be used) for the duration of the COVID-19 declaration under Section 564(b)(1) of the Act, 21 U.S.C.section 360bbb-3(b)(1), unless the authorization is terminated  or revoked sooner.       Influenza A by PCR NEGATIVE NEGATIVE Final   Influenza  B by PCR NEGATIVE NEGATIVE Final    Comment: (NOTE) The Xpert Xpress SARS-CoV-2/FLU/RSV plus assay is intended as an aid in the diagnosis of influenza from Nasopharyngeal swab specimens and should not be used as a sole basis for treatment. Nasal washings and aspirates are unacceptable for Xpert Xpress SARS-CoV-2/FLU/RSV testing.  Fact Sheet for Patients: EntrepreneurPulse.com.au  Fact Sheet for Healthcare Providers: IncredibleEmployment.be  This test is not yet approved or cleared by the [place] FDA and has been authorized for detection and/or diagnosis of SARS-CoV-2 by FDA under an Emergency Use Authorization (EUA). This EUA will remain in effect (meaning this test can be used) for the duration of the COVID-19 declaration under Section 564(b)(1) of the Act, 21 U.S.C. section 360bbb-3(b)(1), unless the authorization is terminated or revoked.  Performed at [facility], [address] [place], [place] [address]   Culture, blood (Routine X 2) w Reflex to ID Panel     Status: None (Preliminary result)   Collection Time: 12/19/20  3:56 AM   Specimen: BLOOD  Result Value Ref Range Status   Specimen Description BLOOD RAC  Final   Special Requests   Final    BOTTLES DRAWN AEROBIC AND  ANAEROBIC Blood Culture adequate volume   Culture   Final    NO GROWTH 1 DAY Performed at [facility], [address] [place], [place] [address]    Report Status PENDING  Incomplete  Culture, blood (Routine X 2) w Reflex to ID Panel     Status: None (Preliminary result)   Collection Time: 12/19/20  4:01 AM   Specimen: BLOOD  Result Value Ref Range Status   Specimen Description BLOOD LFA  Final   Special Requests   Final    BOTTLES DRAWN AEROBIC AND ANAEROBIC Blood Culture adequate volume   Culture   Final    NO GROWTH 1 DAY Performed at [facility], [address] [place], [place] [address]    Report Status PENDING  Incomplete  Respiratory (~20 pathogens) panel by PCR     Status: Abnormal   Collection Time: 12/19/20  4:02 AM   Specimen: Nasopharyngeal Swab; Respiratory  Result Value Ref Range Status   Adenovirus NOT DETECTED NOT DETECTED Final   Coronavirus 229E NOT DETECTED NOT DETECTED Final    Comment: (NOTE) The Coronavirus on the Respiratory Panel, DOES NOT test for the novel  Coronavirus (2019 nCoV)    Coronavirus HKU1 NOT DETECTED NOT DETECTED Final   Coronavirus NL63 NOT DETECTED NOT DETECTED Final   Coronavirus OC43 NOT DETECTED NOT DETECTED Final   Metapneumovirus DETECTED (A) NOT DETECTED Final   Rhinovirus / Enterovirus NOT DETECTED NOT DETECTED Final   Influenza A NOT DETECTED NOT DETECTED Final   Influenza B NOT DETECTED NOT DETECTED Final   Parainfluenza Virus 1 NOT DETECTED NOT DETECTED Final   Parainfluenza Virus 2 NOT DETECTED NOT DETECTED Final   Parainfluenza Virus 3 NOT DETECTED NOT DETECTED Final   Parainfluenza Virus 4 NOT DETECTED NOT DETECTED Final   Respiratory Syncytial Virus NOT DETECTED NOT DETECTED Final   Bordetella pertussis NOT DETECTED NOT DETECTED Final   Bordetella Parapertussis NOT DETECTED NOT DETECTED Final   Chlamydophila pneumoniae NOT DETECTED NOT DETECTED Final   Mycoplasma pneumoniae NOT DETECTED NOT  DETECTED Final    Comment: Performed at [facility], [place], [place], [place] [address]  Urine Culture     Status: None   Collection Time: 12/19/20  4:05 AM  Specimen: Urine, Clean Catch  Result Value Ref Range Status   Specimen Description   Final    URINE, CLEAN CATCH Performed at [facility], [address], [place], [place] [address]    Special Requests   Final    NONE Performed at [facility], [address] [place], [place] [address]    Culture   Final    NO GROWTH Performed at [facility], [place] [address] [place], [place] [address]    Report Status 12/20/2020 FINAL  Final     Labs: BNP (last 3 results) Recent Labs    12/18/20 1409  BNP 017.7*   Basic Metabolic Panel: Recent Labs  Lab 12/18/20 1409 12/20/20 0640  NA 130* 135  K 3.8 3.5  CL 100 106  CO2 21* 21*  GLUCOSE 212* 268*  BUN 36* 40*  CREATININE 1.79* 1.60*  CALCIUM 8.1* 7.9*   Liver Function Tests: Recent Labs  Lab 12/18/20 1409  AST 24  ALT 18  ALKPHOS 54  BILITOT 1.2  PROT 6.1*  ALBUMIN 3.0*   No results for input(s): LIPASE, AMYLASE in the last 168 hours. No results for input(s): AMMONIA in the last 168 hours. CBC: Recent Labs  Lab 12/18/20 1409 12/20/20 0640  WBC 13.2* 9.6  NEUTROABS 8.8*  --   HGB 13.9 12.4*  HCT 40.0 35.0*  MCV 86.4 85.4  PLT 115* 128*   Cardiac Enzymes: No results for input(s): CKTOTAL, CKMB, CKMBINDEX, TROPONINI in the last 168 hours. BNP: Invalid input(s): POCBNP CBG: Recent Labs  Lab 12/19/20 1215 12/19/20 1645 12/19/20 2135 12/20/20 0748 12/20/20 1125  GLUCAP 352* 186* 269* 246* 296*   D-Dimer No results for input(s): DDIMER in the last 72 hours. Hgb A1c Recent Labs    12/20/20 0640  HGBA1C 9.0*   Lipid Profile Recent Labs    12/20/20 0640  CHOL 138  HDL 38*  LDLCALC 85  TRIG 76  CHOLHDL 3.6   Thyroid function studies No results for input(s): TSH, T4TOTAL, T3FREE, THYROIDAB in the  last 72 hours.  Invalid input(s): FREET3 Anemia work up No results for input(s): VITAMINB12, FOLATE, FERRITIN, TIBC, IRON, RETICCTPCT in the last 72 hours. Urinalysis    Component Value Date/Time   COLORURINE YELLOW (A) 12/19/2020 0405   APPEARANCEUR HAZY (A) 12/19/2020 0405   APPEARANCEUR Clear 10/23/2014 1058   LABSPEC 1.020 12/19/2020 0405   PHURINE 5.0 12/19/2020 0405   GLUCOSEU 150 (A) 12/19/2020 0405   HGBUR MODERATE (A) 12/19/2020 0405   BILIRUBINUR NEGATIVE 12/19/2020 0405   BILIRUBINUR Negative 10/23/2014 1058   KETONESUR NEGATIVE 12/19/2020 0405   PROTEINUR >=300 (A) 12/19/2020 0405   NITRITE NEGATIVE 12/19/2020 0405   LEUKOCYTESUR NEGATIVE 12/19/2020 0405   Sepsis Labs Invalid input(s): PROCALCITONIN,  WBC,  LACTICIDVEN Microbiology Recent Results (from the past 240 hour(s))  Resp Panel by RT-PCR (Flu A&B, Covid) Nasopharyngeal Swab     Status: None   Collection Time: 12/18/20  2:09 PM   Specimen: Nasopharyngeal Swab; Nasopharyngeal(NP) swabs in vial transport medium  Result Value Ref Range Status   SARS Coronavirus 2 by RT PCR NEGATIVE NEGATIVE Final    Comment: (NOTE) SARS-CoV-2 target nucleic acids are NOT DETECTED.  The SARS-CoV-2 RNA is generally detectable in upper respiratory specimens during the acute phase of infection. The lowest concentration of SARS-CoV-2 viral copies this assay can detect is 138 copies/mL. A negative result does not preclude SARS-Cov-2 infection and should not be used as the sole basis  for treatment or other patient management decisions. A negative result may occur with  improper specimen collection/handling, submission of specimen other than nasopharyngeal swab, presence of viral mutation(s) within the areas targeted by this assay, and inadequate number of viral copies(<138 copies/mL). A negative result must be combined with clinical observations, patient history, and epidemiological information. The expected result is  Negative.  Fact Sheet for Patients:  EntrepreneurPulse.com.au  Fact Sheet for Healthcare Providers:  IncredibleEmployment.be  This test is no t yet approved or cleared by the [place] FDA and  has been authorized for detection and/or diagnosis of SARS-CoV-2 by FDA under an Emergency Use Authorization (EUA). This EUA will remain  in effect (meaning this test can be used) for the duration of the COVID-19 declaration under Section 564(b)(1) of the Act, 21 U.S.C.section 360bbb-3(b)(1), unless the authorization is terminated  or revoked sooner.       Influenza A by PCR NEGATIVE NEGATIVE Final   Influenza B by PCR NEGATIVE NEGATIVE Final    Comment: (NOTE) The Xpert Xpress SARS-CoV-2/FLU/RSV plus assay is intended as an aid in the diagnosis of influenza from Nasopharyngeal swab specimens and should not be used as a sole basis for treatment. Nasal washings and aspirates are unacceptable for Xpert Xpress SARS-CoV-2/FLU/RSV testing.  Fact Sheet for Patients: EntrepreneurPulse.com.au  Fact Sheet for Healthcare Providers: IncredibleEmployment.be  This test is not yet approved or cleared by the [place] FDA and has been authorized for detection and/or diagnosis of SARS-CoV-2 by FDA under an Emergency Use Authorization (EUA). This EUA will remain in effect (meaning this test can be used) for the duration of the COVID-19 declaration under Section 564(b)(1) of the Act, 21 U.S.C. section 360bbb-3(b)(1), unless the authorization is terminated or revoked.  Performed at [facility], [address] [place], [place] [address]   Culture, blood (Routine X 2) w Reflex to ID Panel     Status: None (Preliminary result)   Collection Time: 12/19/20  3:56 AM   Specimen: BLOOD  Result Value Ref Range Status   Specimen Description BLOOD RAC  Final   Special Requests   Final    BOTTLES DRAWN AEROBIC AND  ANAEROBIC Blood Culture adequate volume   Culture   Final    NO GROWTH 1 DAY Performed at [facility], [address], [place], [place] [address]    Report Status PENDING  Incomplete  Culture, blood (Routine X 2) w Reflex to ID Panel     Status: None (Preliminary result)   Collection Time: 12/19/20  4:01 AM   Specimen: BLOOD  Result Value Ref Range Status   Specimen Description BLOOD LFA  Final   Special Requests   Final    BOTTLES DRAWN AEROBIC AND ANAEROBIC Blood Culture adequate volume   Culture   Final    NO GROWTH 1 DAY Performed at [facility], [address] [place], [place] [address]    Report Status PENDING  Incomplete  Respiratory (~20 pathogens) panel by PCR     Status: Abnormal   Collection Time: 12/19/20  4:02 AM   Specimen: Nasopharyngeal Swab; Respiratory  Result Value Ref Range Status   Adenovirus NOT DETECTED NOT DETECTED Final   Coronavirus 229E NOT DETECTED NOT DETECTED Final    Comment: (NOTE) The Coronavirus on the Respiratory Panel, DOES NOT test for the novel  Coronavirus (2019 nCoV)    Coronavirus HKU1 NOT DETECTED NOT DETECTED Final   Coronavirus NL63 NOT DETECTED NOT DETECTED Final  Coronavirus OC43 NOT DETECTED NOT DETECTED Final   Metapneumovirus DETECTED (A) NOT DETECTED Final   Rhinovirus / Enterovirus NOT DETECTED NOT DETECTED Final   Influenza A NOT DETECTED NOT DETECTED Final   Influenza B NOT DETECTED NOT DETECTED Final   Parainfluenza Virus 1 NOT DETECTED NOT DETECTED Final   Parainfluenza Virus 2 NOT DETECTED NOT DETECTED Final   Parainfluenza Virus 3 NOT DETECTED NOT DETECTED Final   Parainfluenza Virus 4 NOT DETECTED NOT DETECTED Final   Respiratory Syncytial Virus NOT DETECTED NOT DETECTED Final   Bordetella pertussis NOT DETECTED NOT DETECTED Final   Bordetella Parapertussis NOT DETECTED NOT DETECTED Final   Chlamydophila pneumoniae NOT DETECTED NOT DETECTED Final   Mycoplasma pneumoniae NOT DETECTED NOT  DETECTED Final    Comment: Performed at [facility], [place] [address], [place], [place] [address]  Urine Culture     Status: None   Collection Time: 12/19/20  4:05 AM   Specimen: Urine, Clean Catch  Result Value Ref Range Status   Specimen Description   Final    URINE, CLEAN CATCH Performed at [facility], [address] [place], [place] [address]    Special Requests   Final    NONE Performed at [facility], [address], [place], [place] [address]    Culture   Final    NO GROWTH Performed at [facility], [place] [address], [place], [place] [address]    Report Status 12/20/2020 FINAL  Final    Time coordinating discharge: Over 30 minutes  SIGNED:  Lorella Nimrod, MD  [facility] 12/20/2020, 2:14 PM  If 7PM-7AM, please contact night-coverage [email or web address]  This record has been created using Systems analyst. Errors have been sought and corrected,but may not always be located. Such creation errors do not reflect on the standard of care.

## 2020-12-20 NOTE — Plan of Care (Signed)
  Problem: Education: Goal: Knowledge of General Education information will improve Description: Including pain rating scale, medication(s)/side effects and non-pharmacologic comfort measures 12/20/2020 1317 by Cristela Blue, RN Outcome: Progressing 12/20/2020 1317 by Cristela Blue, RN Outcome: Progressing   Problem: Health Behavior/Discharge Planning: Goal: Ability to manage health-related needs will improve 12/20/2020 1317 by Cristela Blue, RN Outcome: Progressing 12/20/2020 1317 by Cristela Blue, RN Outcome: Progressing   Problem: Clinical Measurements: Goal: Ability to maintain clinical measurements within normal limits will improve 12/20/2020 1317 by Cristela Blue, RN Outcome: Progressing 12/20/2020 1317 by Cristela Blue, RN Outcome: Progressing Goal: Will remain free from infection 12/20/2020 1317 by Cristela Blue, RN Outcome: Progressing 12/20/2020 1317 by Cristela Blue, RN Outcome: Progressing Goal: Diagnostic test results will improve 12/20/2020 1317 by Cristela Blue, RN Outcome: Progressing 12/20/2020 1317 by Cristela Blue, RN Outcome: Progressing Goal: Respiratory complications will improve 12/20/2020 1317 by Cristela Blue, RN Outcome: Progressing 12/20/2020 1317 by Cristela Blue, RN Outcome: Progressing Goal: Cardiovascular complication will be avoided 12/20/2020 1317 by Cristela Blue, RN Outcome: Progressing 12/20/2020 1317 by Cristela Blue, RN Outcome: Progressing   Problem: Activity: Goal: Risk for activity intolerance will decrease 12/20/2020 1317 by Cristela Blue, RN Outcome: Progressing 12/20/2020 1317 by Cristela Blue, RN Outcome: Progressing   Problem: Nutrition: Goal: Adequate nutrition will be maintained 12/20/2020 1317 by Cristela Blue, RN Outcome: Progressing 12/20/2020 1317 by Cristela Blue, RN Outcome: Progressing   Problem: Coping: Goal: Level of anxiety will decrease 12/20/2020 1317 by Cristela Blue, RN Outcome:  Progressing 12/20/2020 1317 by Cristela Blue, RN Outcome: Progressing   Problem: Elimination: Goal: Will not experience complications related to bowel motility 12/20/2020 1317 by Cristela Blue, RN Outcome: Progressing 12/20/2020 1317 by Cristela Blue, RN Outcome: Progressing Goal: Will not experience complications related to urinary retention 12/20/2020 1317 by Cristela Blue, RN Outcome: Progressing 12/20/2020 1317 by Cristela Blue, RN Outcome: Progressing   Problem: Pain Managment: Goal: General experience of comfort will improve 12/20/2020 1317 by Cristela Blue, RN Outcome: Progressing 12/20/2020 1317 by Cristela Blue, RN Outcome: Progressing   Problem: Safety: Goal: Ability to remain free from injury will improve 12/20/2020 1317 by Cristela Blue, RN Outcome: Progressing 12/20/2020 1317 by Cristela Blue, RN Outcome: Progressing   Problem: Skin Integrity: Goal: Risk for impaired skin integrity will decrease 12/20/2020 1317 by Cristela Blue, RN Outcome: Progressing 12/20/2020 1317 by Cristela Blue, RN Outcome: Progressing

## 2020-12-20 NOTE — Progress Notes (Signed)
   12/20/20 0930  Clinical Encounter Type  Visited With Patient and family together  Visit Type Initial;Spiritual support;Social support  Referral From Nurse  Consult/Referral To [employer] visited with PT and his wife at bedside and offered emotional and spiritual support. PT's wife spoke of a long wait in the ED department for a bed. Chaplain normalized emotions and made more space for expression of emotions. Chaplain ministered with peace, compassionate presence and reflective listening. PT's wife spoke of their strong faith in God. Chaplain let them know a chaplain is available 24/7; which they were grateful for.   Andee Poles, M. Div.

## 2020-12-21 LAB — LEGIONELLA PNEUMOPHILA SEROGP 1 UR AG: L. pneumophila Serogp 1 Ur Ag: NEGATIVE

## 2020-12-24 LAB — CULTURE, BLOOD (ROUTINE X 2)
Culture: NO GROWTH
Culture: NO GROWTH
Special Requests: ADEQUATE
Special Requests: ADEQUATE

## 2021-01-10 ENCOUNTER — Emergency Department
Admission: EM | Admit: 2021-01-10 | Discharge: 2021-01-10 | Disposition: A | Discharge status: Home / Self Care | Payer: Medicare Other | Attending: Emergency Medicine | Admitting: Emergency Medicine

## 2021-01-10 ENCOUNTER — Emergency Department: Payer: Medicare Other

## 2021-01-10 ENCOUNTER — Other Ambulatory Visit: Payer: Self-pay

## 2021-01-10 DIAGNOSIS — S61419A Laceration without foreign body of unspecified hand, initial encounter: Secondary | ICD-10-CM | POA: Insufficient documentation

## 2021-01-10 DIAGNOSIS — J441 Chronic obstructive pulmonary disease with (acute) exacerbation: Secondary | ICD-10-CM | POA: Insufficient documentation

## 2021-01-10 DIAGNOSIS — Z794 Long term (current) use of insulin: Secondary | ICD-10-CM | POA: Insufficient documentation

## 2021-01-10 DIAGNOSIS — S6990XA Unspecified injury of unspecified wrist, hand and finger(s), initial encounter: Secondary | ICD-10-CM | POA: Diagnosis present

## 2021-01-10 DIAGNOSIS — W01198A Fall on same level from slipping, tripping and stumbling with subsequent striking against other object, initial encounter: Secondary | ICD-10-CM | POA: Diagnosis not present

## 2021-01-10 DIAGNOSIS — Z7951 Long term (current) use of inhaled steroids: Secondary | ICD-10-CM | POA: Diagnosis not present

## 2021-01-10 DIAGNOSIS — I2510 Atherosclerotic heart disease of native coronary artery without angina pectoris: Secondary | ICD-10-CM | POA: Insufficient documentation

## 2021-01-10 DIAGNOSIS — Z79899 Other long term (current) drug therapy: Secondary | ICD-10-CM | POA: Diagnosis not present

## 2021-01-10 DIAGNOSIS — S7001XA Contusion of right hip, initial encounter: Secondary | ICD-10-CM | POA: Insufficient documentation

## 2021-01-10 DIAGNOSIS — Y92009 Unspecified place in unspecified non-institutional (private) residence as the place of occurrence of the external cause: Secondary | ICD-10-CM | POA: Insufficient documentation

## 2021-01-10 DIAGNOSIS — Z7902 Long term (current) use of antithrombotics/antiplatelets: Secondary | ICD-10-CM | POA: Insufficient documentation

## 2021-01-10 DIAGNOSIS — I129 Hypertensive chronic kidney disease with stage 1 through stage 4 chronic kidney disease, or unspecified chronic kidney disease: Secondary | ICD-10-CM | POA: Diagnosis not present

## 2021-01-10 DIAGNOSIS — S0081XA Abrasion of other part of head, initial encounter: Secondary | ICD-10-CM | POA: Diagnosis not present

## 2021-01-10 DIAGNOSIS — Z8546 Personal history of malignant neoplasm of prostate: Secondary | ICD-10-CM | POA: Diagnosis not present

## 2021-01-10 DIAGNOSIS — F17210 Nicotine dependence, cigarettes, uncomplicated: Secondary | ICD-10-CM | POA: Insufficient documentation

## 2021-01-10 DIAGNOSIS — E1122 Type 2 diabetes mellitus with diabetic chronic kidney disease: Secondary | ICD-10-CM | POA: Insufficient documentation

## 2021-01-10 DIAGNOSIS — N1832 Chronic kidney disease, stage 3b: Secondary | ICD-10-CM | POA: Insufficient documentation

## 2021-01-10 DIAGNOSIS — S0990XA Unspecified injury of head, initial encounter: Secondary | ICD-10-CM | POA: Diagnosis not present

## 2021-01-10 DIAGNOSIS — Z85820 Personal history of malignant melanoma of skin: Secondary | ICD-10-CM | POA: Insufficient documentation

## 2021-01-10 MED ORDER — BACITRACIN-NEOMYCIN-POLYMYXIN 400-5-5000 EX OINT
TOPICAL_OINTMENT | Freq: Once | CUTANEOUS | Status: AC
  Filled 2021-01-10: qty 1

## 2021-01-10 NOTE — Discharge Instructions (Signed)
Take tylenol if needed for pain. Follow up with primary care for symptoms of concern. Return to the ER for symptoms that change or worsen if unable to schedule an appointment.

## 2021-01-10 NOTE — ED Triage Notes (Signed)
Pt comes via EMs from home with c/o fall. Pt tripped and fell hitting head on box fan. Pt states he is on thinners. VSS

## 2021-01-10 NOTE — ED Provider Notes (Signed)
[facility] Emergency Department Provider Note ____________________________________________   Event Date/Time   First MD Initiated Contact with Patient 01/10/21 1408     (approximate)  I have reviewed the triage vital signs and the nursing notes.   HISTORY  Chief Complaint Fall  HPI Richard Chapman is a 80 y.o. male with history of subdural hemorrhage and remaining history as listed below presents to the emergency department for treatment and evaluation after mechanical, nonsyncopal fall prior to arrival.  He was going out to the building to find some cigarettes and leg over his walker.  When he did he slipped and hit his forehead on a box fan.  He has also having right hip pain.     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   HTN (hypertension) 12/19/2020   HLD (hyperlipidemia) 12/19/2020   Tobacco abuse 12/19/2020   Thrombocytopenia ([place]) 12/19/2020   Elevated troponin 12/19/2020   Diarrhea 12/19/2020   Sepsis due to pneumonia ([place]) 04/14/2018   Bacteremia 12/15/2016   Severe sepsis ([place]) 12/14/2016   Atypical chest pain 09/24/2015   Elevated WBC count 05/05/2015   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   Stroke ([place]) 04/06/2015   Cerebral infarction ([place]) 04/06/2015   Malignant neoplasm of prostate ([place]) 01/29/2015   Cervical disc disease 12/03/2014   Chest pain 11/25/2014   Arthritis 08/20/2014   Benign fibroma of prostate 08/20/2014   Clinical depression 08/20/2014   Failure of erection  08/20/2014   Enlarged prostate 08/20/2014   Major depressive disorder with single episode 08/20/2014   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   Benign essential HTN 08/04/2014   Acute low back pain 04/23/2014   Alteration in bowel elimination: incontinence 04/23/2014   Urge incontinence 04/23/2014   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   Benign essential tremor 12/23/2013   B12 deficiency 08/13/2013   Chronic kidney disease (CKD), stage III (moderate) ([place]) 08/13/2013   Diabetes ([place]) 08/13/2013   Hypoglycemia 08/13/2013   Diabetes mellitus ([place]) 08/13/2013   Type 2 diabetes mellitus ([place]) 08/13/2013   Arteriosclerosis of coronary artery 07/24/2013   Fatigue 07/24/2013   Acid reflux 07/24/2013   Combined fat and carbohydrate induced hyperlipemia 07/24/2013   CAD in native artery 07/24/2013   Gastro-esophageal reflux disease without esophagitis 07/24/2013   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 Authorizing Provider  acetaminophen (TYLENOL) 325 MG tablet Take 2 tablets (650 mg total) every 6 (six) hours as needed by mouth for mild pain (or Fever >/= 101). 12/18/16   Gouru, Illene Silver, MD  amLODipine (NORVASC) 10 MG tablet Take 1 tablet (10 mg total) by mouth daily. 04/07/15   Fritzi Mandes, MD  atorvastatin (LIPITOR) 10 MG tablet Take 5 mg by mouth daily.    [provider]  cetirizine (ZYRTEC) 10 MG  tablet Take 10 mg by mouth daily.    [provider]  Cholecalciferol 25 MCG (1000 UT) tablet Take 1 tablet by mouth daily. 03/12/20   [provider]  clopidogrel (PLAVIX) 75 MG tablet Take 1 tablet (75 mg total) by mouth daily. 04/07/15   Fritzi Mandes, MD  dextromethorphan-guaiFENesin [facility] DM) 30-600 MG 12hr tablet Take 1 tablet by mouth 2 (two) times daily as needed for cough. 12/20/20   Lorella Nimrod, MD  docusate sodium (COLACE) 100 MG capsule Take 1 capsule (100 mg total) by mouth 2 (two) times daily. Patient not taking: Reported on 12/19/2020 04/19/18   Loletha Grayer, MD  escitalopram (LEXAPRO) 20 MG tablet Take 1 tablet (20 mg total) by mouth daily. Patient taking differently: Take 20 mg by mouth 2 (two) times daily. 11/26/14   Nicholes Mango, MD  esomeprazole (NEXIUM) 20 MG capsule Take 1 capsule (20 mg total) by mouth AC breakfast. 09/24/15   Fritzi Mandes, MD  famotidine (PEPCID) 20 MG tablet Take 1 tablet by mouth at bedtime. 01/21/20   [provider]  gabapentin (NEURONTIN) 300 MG capsule TAKE ONE CAPSULE BY MOUTH AT BEDTIME FOR NERVE PAIN 03/26/20   [provider]  hyoscyamine (LEVBID) 0.375 MG 12 hr tablet Take 0.375 mg by mouth every 12 (twelve) hours.    [provider]  insulin degludec (TRESIBA) 100 UNIT/ML SOPN FlexTouch Pen Inject 20 Units into the skin daily. 02/22/18   [provider]  Ipratropium-Albuterol (COMBIVENT RESPIMAT) 20-100 MCG/ACT AERS respimat Inhale 1 puff into the lungs every 6 (six) hours. 12/20/20   Lorella Nimrod, MD  isosorbide mononitrate (IMDUR) 60 MG 24 hr tablet Take 1 tablet (60 mg total) by mouth daily. 04/19/18   Loletha Grayer, MD  losartan (COZAAR) 100 MG tablet Take 100 mg by mouth daily.     [provider]  magnesium oxide (MAG-OX) 400 MG tablet Take 400 mg by mouth daily.     [provider]  nitroGLYCERIN (NITROSTAT) 0.4 MG SL tablet Place 0.4 mg under the tongue every 5 (five)  minutes as needed for chest pain.     [provider]  Nutritional Supplements (FEEDING SUPPLEMENT, GLUCERNA 1.2 CAL,) LIQD Take by mouth. 12/05/19   [provider]  Roma Schanz test strip SMARTSIG:1 Each Via Meter Daily 03/03/20   [provider]  primidone (MYSOLINE) 50 MG tablet Take 1 tablet by mouth 2 (two) times daily. 11/04/14   [provider]  propranolol (INDERAL) 40 MG tablet TAKE ONE-HALF TABLET BY MOUTH TWO TIMES A DAY FOR BLOOD PRESSURE 02/25/20   [provider]  tamsulosin (FLOMAX) 0.4 MG CAPS capsule Take 0.4 mg by mouth daily.     [provider]  traZODone (DESYREL) 100 MG tablet Take 1 tablet (100 mg total) by mouth at bedtime. 04/19/18   Loletha Grayer, MD  vitamin B-12 (CYANOCOBALAMIN) 1000 MCG tablet Take 1,000 mcg by mouth daily.     [provider]  white petrolatum (VASELINE) OINT Apply 1  application topically as needed (Wound care). 04/19/18   Loletha Grayer, MD    Allergies Lisinopril  Family History  Problem Relation Age of Onset   Heart disease Mother    Heart disease Father    Diabetes Father    Stroke Father    Kidney disease Neg Hx    Prostate cancer Neg Hx     Social History Social History   Tobacco Use   Smoking status: Every Day    Packs/day: 1.00    Types: Cigarettes   Smokeless tobacco: Never  Vaping Use   Vaping Use: Never used  Substance Use Topics   Alcohol use: Yes    Alcohol/week: 2.0 standard drinks    Types: 2 Standard drinks or equivalent per week   Drug use: No    Review of Systems  Constitutional: No fever/chills Eyes: No visual changes. ENT: No sore throat. Cardiovascular: Denies chest pain. Respiratory: Denies shortness of breath. Gastrointestinal: No abdominal pain.  No nausea, no vomiting.  No diarrhea.  No constipation. Genitourinary: Negative for dysuria. Musculoskeletal: Negative for back pain. Positive for right hip pain. Skin: Negative for  rash. Neurological: Negative for headaches, focal weakness or numbness  ____________________________________________   PHYSICAL EXAM:  VITAL SIGNS: ED Triage Vitals [01/10/21 1339]  Enc Vitals Group     BP 177/68     Pulse 96     Resp 17     Temp 97.8     Temp src      SpO2 98     Weight      Height      Head Circumference      Peak Flow      Pain Score 7     Pain Loc      Pain Edu?      Excl. in [place]?     Constitutional: Alert and oriented.  Overall well appearing and in no acute distress. Eyes: Conjunctivae are normal.  Pupils are equal  head: Abrasion to the right side of the forehead Nose: No congestion/rhinnorhea. Mouth/Throat: Mucous membranes are moist.  Oropharynx non-erythematous. Neck: No stridor.   Hematological/Lymphatic/Immunilogical: No cervical lymphadenopathy. Cardiovascular: Normal rate, regular rhythm. Grossly normal heart sounds.  Good peripheral circulation. Respiratory: Normal respiratory effort.  No retractions. Lungs CTAB. Gastrointestinal: Soft and nontender. No distention. No abdominal bruits. No CVA tenderness. Genitourinary:  Musculoskeletal: Right lateral hip pain.  Some pain with flexion of the knee.  No increase in pain with internal or external rotation.  Full range of motion of upper extremities and left lower extremity.. Neurologic:  Normal speech and language. No gross focal neurologic deficits are appreciated. No gait instability. Skin:  Skin is warm, dry and intact. No rash noted. Psychiatric: Mood and affect are normal. Speech and behavior are normal.  ____________________________________________   LABS (all labs ordered are listed, but only abnormal results are displayed)  Labs Reviewed - No data to display ____________________________________________  EKG  Not indicated ____________________________________________  RADIOLOGY  ED MD interpretation:    Head CT shows no acute intracranial abnormality.  I, Sherrie George,  personally viewed and evaluated these images (plain radiographs) as part of my medical decision making, as well as reviewing the written report by the radiologist.  Official radiology report(s): CT HEAD WO CONTRAST (5MM)  Result Date: 01/10/2021 CLINICAL DATA:  Fall. EXAM: CT HEAD WITHOUT CONTRAST TECHNIQUE: Contiguous axial images were obtained from the base of the skull through the vertex without intravenous contrast. COMPARISON:  Jun 15, 2018. FINDINGS:  Brain: Mild diffuse cortical atrophy is noted. Mild chronic ischemic white matter disease is noted. No mass effect or midline shift is noted. Ventricular size is within normal limits. There is no evidence of mass lesion, hemorrhage or acute infarction. Vascular: No hyperdense vessel or unexpected calcification. Skull: Status post bilateral frontal and parietal craniotomies. No acute fracture is noted. Sinuses/Orbits: No acute finding. Other: None. IMPRESSION: No acute intracranial abnormality seen. Electronically Signed   By: Marijo Conception M.D.   On: 01/10/2021 13:59   DG Hip Unilat W or Wo Pelvis 2-3 Views Right  Result Date: 01/10/2021 CLINICAL DATA:  Status post fall. EXAM: DG HIP (WITH OR WITHOUT PELVIS) 2-3V RIGHT COMPARISON:  None. FINDINGS: There is no evidence of hip fracture or dislocation. Mild bilateral and symmetric degenerative change noted within both hips. Degenerative disc disease noted within the visualized portions of the lower lumbar spine. IMPRESSION: 1. No acute findings. 2. 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 Electronically Signed   By: Kerby Moors M.D.   On: 01/10/2021 14:47    ____________________________________________   PROCEDURES  Procedure(s) performed (including Critical Care):  Procedures  ____________________________________________   INITIAL IMPRESSION / ASSESSMENT AND PLAN     79 year old male presenting to the emergency department after mechanical, nonsyncopal fall at home.   See HPI for further details.  While awaiting ER room assignment, head CT was obtained and is negative for acute concerns.  On exam, he does have some right hip pain.  Plan will be to get an x-ray.     As part of my medical decision making, I reviewed the following data within the [facility] chart reviewed and Radiograph reviewed  ___________________________________________   FINAL CLINICAL IMPRESSION(S) / ED DIAGNOSES  Final diagnoses:  Minor head injury, initial encounter  Skin tear of hand without complication, initial encounter  Contusion of right hip, initial encounter     ED Discharge Orders     None        Richard Chapman was evaluated in Emergency Department on 01/10/2021 for the symptoms described in the history of present illness. He was evaluated in the context of the global COVID-19 pandemic, which necessitated consideration that the patient might be at risk for infection with the SARS-CoV-2 virus that causes COVID-19. Institutional protocols and algorithms that pertain to the evaluation of patients at risk for COVID-19 are in a state of rapid change based on information released by regulatory bodies including the CDC and federal and state organizations. These policies and algorithms were followed during the patient's care in the ED.   Note:  This document was prepared using Dragon voice recognition software and may include unintentional dictation errors.    Victorino Dike, FNP 01/10/21 1502    Rada Hay, MD 01/10/21 [phone]

## 2021-04-01 ENCOUNTER — Other Ambulatory Visit
Admission: RE | Admit: 2021-04-01 | Discharge: 2021-04-01 | Disposition: A | Discharge status: Home / Self Care | Payer: Medicare Other | Attending: Urology | Admitting: Urology

## 2021-04-01 DIAGNOSIS — C61 Malignant neoplasm of prostate: Secondary | ICD-10-CM | POA: Insufficient documentation

## 2021-04-01 LAB — PSA: Prostatic Specific Antigen: 26.46 ng/mL — ABNORMAL HIGH (ref 0.00–4.00)

## 2021-04-04 ENCOUNTER — Telehealth: Payer: Self-pay

## 2021-04-04 DIAGNOSIS — C61 Malignant neoplasm of prostate: Secondary | ICD-10-CM

## 2021-04-04 NOTE — Telephone Encounter (Signed)
Spoke with patient wife [place] pt scheduled for 04/22/21 in [place] and is aware to get labs prior.

## 2021-04-04 NOTE — Telephone Encounter (Signed)
-----   Message from Hollice Espy, MD sent at 04/04/2021 11:43 AM EST ----- PSA has doubled in the past 6 months which is alarming.  Lets have him come back and see me in a month with another PSA prior and if it still markedly elevated, we can discuss options to evaluate this further.  Hollice Espy, MD

## 2021-04-20 ENCOUNTER — Other Ambulatory Visit
Admission: RE | Admit: 2021-04-20 | Discharge: 2021-04-20 | Disposition: A | Discharge status: Home / Self Care | Payer: Medicare Other | Attending: Urology | Admitting: Urology

## 2021-04-20 DIAGNOSIS — C61 Malignant neoplasm of prostate: Secondary | ICD-10-CM | POA: Diagnosis present

## 2021-04-20 LAB — PSA: Prostatic Specific Antigen: 19.19 ng/mL — ABNORMAL HIGH (ref 0.00–4.00)

## 2021-04-21 ENCOUNTER — Telehealth: Payer: Self-pay

## 2021-04-21 DIAGNOSIS — C61 Malignant neoplasm of prostate: Secondary | ICD-10-CM

## 2021-04-21 NOTE — Telephone Encounter (Signed)
-----   Message from Hollice Espy, MD sent at 04/21/2021  8:34 AM EST ----- ?PSA is trending downward which is great news.  Lets plan to skip f/u tomorrow since its hard to get him up and out to appointments and reschedule in [place] for visit with PSA prior in 6 mo.   ? ?Hollice Espy, MD ? ?

## 2021-04-21 NOTE — Telephone Encounter (Signed)
Pt wife aware. Rescheduled appointment ?

## 2021-04-22 ENCOUNTER — Ambulatory Visit: Payer: Medicare Other | Admitting: Urology

## 2021-09-30 ENCOUNTER — Ambulatory Visit: Payer: Medicare Other | Admitting: Urology

## 2021-10-03 ENCOUNTER — Other Ambulatory Visit
Admission: RE | Admit: 2021-10-03 | Discharge: 2021-10-03 | Disposition: A | Discharge status: Home / Self Care | Payer: Medicare Other | Attending: Urology | Admitting: Urology

## 2021-10-03 DIAGNOSIS — C61 Malignant neoplasm of prostate: Secondary | ICD-10-CM

## 2021-10-03 LAB — PSA: Prostatic Specific Antigen: 20.52 ng/mL — ABNORMAL HIGH (ref 0.00–4.00)

## 2021-10-07 ENCOUNTER — Encounter: Payer: Self-pay | Admitting: Urology

## 2021-10-07 ENCOUNTER — Ambulatory Visit (INDEPENDENT_AMBULATORY_CARE_PROVIDER_SITE_OTHER): Payer: Medicare Other | Admitting: Urology

## 2021-10-07 VITALS — BP 114/66 | HR 59 | Ht 68.0 in | Wt 170.0 lb

## 2021-10-07 DIAGNOSIS — N3281 Overactive bladder: Secondary | ICD-10-CM

## 2021-10-07 DIAGNOSIS — C61 Malignant neoplasm of prostate: Secondary | ICD-10-CM

## 2021-10-07 DIAGNOSIS — R32 Unspecified urinary incontinence: Secondary | ICD-10-CM

## 2021-10-07 NOTE — Progress Notes (Signed)
10/07/21 4:05 PM   Richard Chapman Jun 06, 1940 [id number]  Referring provider:  Leonel Ramsay, MD [place],  [place] [address] Chief Complaint  Patient presents with   Prostate Cancer    1 year follow up w/PSA      HPI: Richard Chapman Day is a 81 y.o.male with a personal tree of low risk prostate cancer on active surveillance and OAB who returns today for a 1 year follow-up with PSA.   Elevated PSA to 9.1 dx 09/2014 with Gleason 3+3 in 2 cores involving the right apex, up to 23% of tissue.  Rectal exam was benign. TRUS volume 60 cc.      MRI performed on 02/22/2016 shows no evidence of obvious high-grade lesions.  He does have nodular transition zone.  Prostatic capsule is intact.. He does have some subclinical lymph nodes in the bilateral iliac measuring 6 mm.  Gland size 66 cc.    His most recent PSA was 20.52  He reports some urinary frequency and incontinence.   PSA trend:  Prostatic Specific Antigen  Latest Ref Rng 0.00 - 4.00 ng/mL  01/09/2017 11.98 (H)   07/12/2017 10.78 (H)   08/19/2018 12.15 (H)   09/03/2019 15.83 (H)   03/26/2020 16.48 (H)   09/29/2020 13.23 (H)   04/01/2021 26.46 (H)   04/20/2021 19.19 (H)   10/03/2021 20.52 (H)     Legend: (H) High      PMH: Past Medical History:  Diagnosis Date   Anxiety and depression    Depression    Diabetes ([place])    Elevated PSA    GERD (gastroesophageal reflux disease)    History of nephrolithiasis    Hypertension    Skin cancer    Sleep apnea     Surgical History: Past Surgical History:  Procedure Laterality Date   APPENDECTOMY     BACK SURGERY     BURR HOLE FOR SUBDURAL HEMATOMA     2015   KNEE SURGERY Bilateral    NECK SURGERY     SHOULDER SURGERY     TONSILLECTOMY     WRIST SURGERY      Home Medications:  Allergies as of 10/07/2021       Reactions   Lisinopril Other (See Comments), Cough        Medication List        Accurate as of October 07, 2021 11:59 PM. If  you have any questions, ask your nurse or doctor.          STOP taking these medications    dextromethorphan-guaiFENesin 30-600 MG 12hr tablet Commonly known as: MUCINEX DM Stopped by: Hollice Espy, MD       TAKE these medications    acetaminophen 325 MG tablet Commonly known as: TYLENOL Take 2 tablets (650 mg total) every 6 (six) hours as needed by mouth for mild pain (or Fever >/= 101).   amLODipine 10 MG tablet Commonly known as: NORVASC Take 1 tablet (10 mg total) by mouth daily.   atorvastatin 10 MG tablet Commonly known as: LIPITOR Take 5 mg by mouth daily.   cetirizine 10 MG tablet Commonly known as: ZYRTEC Take 10 mg by mouth daily.   Cholecalciferol 25 MCG (1000 UT) tablet Take 1 tablet by mouth daily.   clopidogrel 75 MG tablet Commonly known as: PLAVIX Take 1 tablet (75 mg total) by mouth daily.   Combivent Respimat 20-100 MCG/ACT Aers respimat Generic drug: Ipratropium-Albuterol Inhale 1 puff into the  lungs every 6 (six) hours.   cyanocobalamin 1000 MCG tablet Commonly known as: VITAMIN B12 Take 1,000 mcg by mouth daily.   docusate sodium 100 MG capsule Commonly known as: COLACE Take 1 capsule (100 mg total) by mouth 2 (two) times daily.   escitalopram 20 MG tablet Commonly known as: LEXAPRO Take 1 tablet (20 mg total) by mouth daily. What changed: when to take this   esomeprazole 20 MG capsule Commonly known as: NEXIUM Take 1 capsule (20 mg total) by mouth AC breakfast.   famotidine 20 MG tablet Commonly known as: PEPCID Take 1 tablet by mouth at bedtime.   feeding supplement (GLUCERNA 1.2 CAL) Liqd Take by mouth.   gabapentin 300 MG capsule Commonly known as: NEURONTIN TAKE ONE CAPSULE BY MOUTH AT BEDTIME FOR NERVE PAIN   glipiZIDE 5 MG tablet Commonly known as: GLUCOTROL Take 5 mg by mouth every morning.   hyoscyamine 0.375 MG 12 hr tablet Commonly known as: LEVBID Take 0.375 mg by mouth every 12 (twelve) hours.    insulin degludec 100 UNIT/ML FlexTouch Pen Commonly known as: TRESIBA Inject 20 Units into the skin daily.   isosorbide mononitrate 60 MG 24 hr tablet Commonly known as: IMDUR Take 1 tablet (60 mg total) by mouth daily.   losartan 100 MG tablet Commonly known as: COZAAR Take 100 mg by mouth daily.   magnesium oxide 400 MG tablet Commonly known as: MAG-OX Take 400 mg by mouth daily.   nitroGLYCERIN 0.4 MG SL tablet Commonly known as: NITROSTAT Place 0.4 mg under the tongue every 5 (five) minutes as needed for chest pain.   OneTouch Verio test strip Generic drug: glucose blood SMARTSIG:1 Each Via Meter Daily   primidone 50 MG tablet Commonly known as: MYSOLINE Take 1 tablet by mouth 2 (two) times daily.   propranolol 40 MG tablet Commonly known as: INDERAL TAKE ONE-HALF TABLET BY MOUTH TWO TIMES A DAY FOR BLOOD PRESSURE   tamsulosin 0.4 MG Caps capsule Commonly known as: FLOMAX Take 0.4 mg by mouth daily.   traZODone 100 MG tablet Commonly known as: DESYREL Take 1 tablet (100 mg total) by mouth at bedtime.   white petrolatum Oint Commonly known as: VASELINE Apply 1 application topically as needed (Wound care).        Allergies:  Allergies  Allergen Reactions   Lisinopril Other (See Comments) and Cough    Family History: Family History  Problem Relation Age of Onset   Heart disease Mother    Heart disease Father    Diabetes Father    Stroke Father    Kidney disease Neg Hx    Prostate cancer Neg Hx     Social History:  reports that he has been smoking cigarettes. He has been smoking an average of 1 pack per day. He has never used smokeless tobacco. He reports current alcohol use of about 2.0 standard drinks of alcohol per week. He reports that he does not use drugs.   Physical Exam: BP 114/66   Pulse (!) 59   Ht '5\' 8"'$  (1.727 m)   Wt 170 lb (77.1 kg)   BMI 25.85 kg/m   Constitutional:  Alert and oriented, No acute distress.  Accompanied by his  wife today.  In wheelchair. HEENT: [place] AT, moist mucus membranes.  Trachea midline, no masses. Cardiovascular: No clubbing, cyanosis, or edema. Respiratory: Normal respiratory effort, no increased work of breathing. Skin: No rashes, bruises or suspicious lesions. Neurologic: Grossly intact, no focal deficits, moving all  4 extremities. Psychiatric: Normal mood and affect.  Laboratory Data:  Lab Results  Component Value Date   CREATININE 1.60 (H) 12/20/2020   Lab Results  Component Value Date   HGBA1C 9.0 (H) 12/20/2020    Assessment & Plan:   1. Prostate cancer ([place]) - Low risk prostate cancer on active surveillance  - Given his age and medical comorbidities, strong preference for continued surveillance, have deferred rectal exam moving forward unless his PSA starts to rise precipitously as it will not likely change management - Will plan for PSA only in 6 months and then a PSA with MD visit in a year. - PSA; Standing  2. Urinary frequency/ incontinence   Discussed condom cath. He is satisfied right now and does not want to try anything different.   Return in about 1 year (around 10/08/2022) for 31moPSA only, 1 year w/PSA prior.  IConley Rollsas a sEducation administratorfor AHollice Espy MD.,have documented all relevant documentation on the behalf of AHollice Espy MD,as directed by  AHollice Espy MD while in the presence of AHollice Espy MD.  I have reviewed the above documentation for accuracy and completeness, and I agree with the above.   AHollice Espy MD  [facility] [address] [place] [phone]

## 2022-05-13 ENCOUNTER — Other Ambulatory Visit: Payer: Self-pay

## 2022-05-13 ENCOUNTER — Inpatient Hospital Stay
Admission: EM | Admit: 2022-05-13 | Discharge: 2022-05-17 | DRG: 871 | Disposition: A | Discharge status: Home Health | Payer: Medicare Other | Attending: Internal Medicine | Admitting: Internal Medicine

## 2022-05-13 ENCOUNTER — Ambulatory Visit
Admission: EM | Admit: 2022-05-13 | Discharge: 2022-05-13 | Disposition: A | Discharge status: Home / Self Care | Payer: Medicare Other | Attending: Family Medicine | Admitting: Family Medicine

## 2022-05-13 ENCOUNTER — Emergency Department: Payer: Medicare Other

## 2022-05-13 DIAGNOSIS — R82998 Other abnormal findings in urine: Secondary | ICD-10-CM | POA: Diagnosis present

## 2022-05-13 DIAGNOSIS — A419 Sepsis, unspecified organism: Principal | ICD-10-CM | POA: Diagnosis present

## 2022-05-13 DIAGNOSIS — H9190 Unspecified hearing loss, unspecified ear: Secondary | ICD-10-CM | POA: Diagnosis present

## 2022-05-13 DIAGNOSIS — Z7902 Long term (current) use of antithrombotics/antiplatelets: Secondary | ICD-10-CM

## 2022-05-13 DIAGNOSIS — I2510 Atherosclerotic heart disease of native coronary artery without angina pectoris: Secondary | ICD-10-CM | POA: Diagnosis present

## 2022-05-13 DIAGNOSIS — G9340 Encephalopathy, unspecified: Secondary | ICD-10-CM

## 2022-05-13 DIAGNOSIS — G4730 Sleep apnea, unspecified: Secondary | ICD-10-CM | POA: Diagnosis present

## 2022-05-13 DIAGNOSIS — Z9049 Acquired absence of other specified parts of digestive tract: Secondary | ICD-10-CM

## 2022-05-13 DIAGNOSIS — R410 Disorientation, unspecified: Secondary | ICD-10-CM | POA: Diagnosis present

## 2022-05-13 DIAGNOSIS — N1832 Chronic kidney disease, stage 3b: Secondary | ICD-10-CM | POA: Diagnosis present

## 2022-05-13 DIAGNOSIS — Z85828 Personal history of other malignant neoplasm of skin: Secondary | ICD-10-CM

## 2022-05-13 DIAGNOSIS — Z720 Tobacco use: Secondary | ICD-10-CM | POA: Diagnosis present

## 2022-05-13 DIAGNOSIS — R4182 Altered mental status, unspecified: Principal | ICD-10-CM

## 2022-05-13 DIAGNOSIS — N183 Chronic kidney disease, stage 3 unspecified: Secondary | ICD-10-CM | POA: Diagnosis present

## 2022-05-13 DIAGNOSIS — Z8679 Personal history of other diseases of the circulatory system: Secondary | ICD-10-CM

## 2022-05-13 DIAGNOSIS — K219 Gastro-esophageal reflux disease without esophagitis: Secondary | ICD-10-CM | POA: Diagnosis present

## 2022-05-13 DIAGNOSIS — J189 Pneumonia, unspecified organism: Secondary | ICD-10-CM | POA: Diagnosis present

## 2022-05-13 DIAGNOSIS — E1129 Type 2 diabetes mellitus with other diabetic kidney complication: Secondary | ICD-10-CM | POA: Diagnosis present

## 2022-05-13 DIAGNOSIS — H7492 Unspecified disorder of left middle ear and mastoid: Secondary | ICD-10-CM | POA: Diagnosis present

## 2022-05-13 DIAGNOSIS — F32A Depression, unspecified: Secondary | ICD-10-CM | POA: Diagnosis present

## 2022-05-13 DIAGNOSIS — Z1152 Encounter for screening for COVID-19: Secondary | ICD-10-CM

## 2022-05-13 DIAGNOSIS — Z8546 Personal history of malignant neoplasm of prostate: Secondary | ICD-10-CM

## 2022-05-13 DIAGNOSIS — F419 Anxiety disorder, unspecified: Secondary | ICD-10-CM | POA: Diagnosis present

## 2022-05-13 DIAGNOSIS — Z794 Long term (current) use of insulin: Secondary | ICD-10-CM

## 2022-05-13 DIAGNOSIS — I10 Essential (primary) hypertension: Secondary | ICD-10-CM | POA: Diagnosis present

## 2022-05-13 DIAGNOSIS — Z87442 Personal history of urinary calculi: Secondary | ICD-10-CM

## 2022-05-13 DIAGNOSIS — Z79899 Other long term (current) drug therapy: Secondary | ICD-10-CM

## 2022-05-13 DIAGNOSIS — E1122 Type 2 diabetes mellitus with diabetic chronic kidney disease: Secondary | ICD-10-CM | POA: Diagnosis present

## 2022-05-13 DIAGNOSIS — Z888 Allergy status to other drugs, medicaments and biological substances status: Secondary | ICD-10-CM

## 2022-05-13 DIAGNOSIS — Z7984 Long term (current) use of oral hypoglycemic drugs: Secondary | ICD-10-CM

## 2022-05-13 DIAGNOSIS — F17200 Nicotine dependence, unspecified, uncomplicated: Secondary | ICD-10-CM | POA: Diagnosis present

## 2022-05-13 DIAGNOSIS — I129 Hypertensive chronic kidney disease with stage 1 through stage 4 chronic kidney disease, or unspecified chronic kidney disease: Secondary | ICD-10-CM | POA: Diagnosis present

## 2022-05-13 LAB — COMPREHENSIVE METABOLIC PANEL
ALT: 19 U/L (ref 0–44)
AST: 20 U/L (ref 15–41)
Albumin: 3.4 g/dL — ABNORMAL LOW (ref 3.5–5.0)
Alkaline Phosphatase: 65 U/L (ref 38–126)
Anion gap: 8 (ref 5–15)
BUN: 36 mg/dL — ABNORMAL HIGH (ref 8–23)
CO2: 21 mmol/L — ABNORMAL LOW (ref 22–32)
Calcium: 8.5 mg/dL — ABNORMAL LOW (ref 8.9–10.3)
Chloride: 102 mmol/L (ref 98–111)
Creatinine, Ser: 1.72 mg/dL — ABNORMAL HIGH (ref 0.61–1.24)
GFR, Estimated: 39 mL/min — ABNORMAL LOW (ref >60)
Glucose, Bld: 162 mg/dL — ABNORMAL HIGH (ref 70–99)
Potassium: 3.9 mmol/L (ref 3.5–5.1)
Sodium: 131 mmol/L — ABNORMAL LOW (ref 135–145)
Total Bilirubin: 0.9 mg/dL (ref 0.3–1.2)
Total Protein: 6.4 g/dL — ABNORMAL LOW (ref 6.5–8.1)

## 2022-05-13 LAB — URINALYSIS, COMPLETE (UACMP) WITH MICROSCOPIC
Bacteria, UA: NONE SEEN
Bilirubin Urine: NEGATIVE
Glucose, UA: 50 mg/dL — AB
Ketones, ur: NEGATIVE mg/dL
Leukocytes,Ua: NEGATIVE
Nitrite: NEGATIVE
Protein, ur: >=300 mg/dL — AB
Specific Gravity, Urine: 1.016 (ref 1.005–1.030)
pH: 5 (ref 5.0–8.0)

## 2022-05-13 LAB — CBC
HCT: 42.9 % (ref 39.0–52.0)
HCT: 41.3 % (ref 39.0–52.0)
Hemoglobin: 14.7 g/dL (ref 13.0–17.0)
Hemoglobin: 14.4 g/dL (ref 13.0–17.0)
MCH: 30.1 pg (ref 26.0–34.0)
MCH: 30.4 pg (ref 26.0–34.0)
MCHC: 34.3 g/dL (ref 30.0–36.0)
MCHC: 34.9 g/dL (ref 30.0–36.0)
MCV: 87.7 fL (ref 80.0–100.0)
MCV: 87.1 fL (ref 80.0–100.0)
Platelets: 183 10*3/uL (ref 150–400)
Platelets: 183 10*3/uL (ref 150–400)
RBC: 4.89 MIL/uL (ref 4.22–5.81)
RBC: 4.74 MIL/uL (ref 4.22–5.81)
RDW: 13.4 % (ref 11.5–15.5)
RDW: 13.7 % (ref 11.5–15.5)
WBC: 20 10*3/uL — ABNORMAL HIGH (ref 4.0–10.5)
WBC: 22.4 10*3/uL — ABNORMAL HIGH (ref 4.0–10.5)
nRBC: 0 % (ref 0.0–0.2)
nRBC: 0 % (ref 0.0–0.2)

## 2022-05-13 LAB — RESP PANEL BY RT-PCR (RSV, FLU A&B, COVID)  RVPGX2
Influenza A by PCR: NEGATIVE
Influenza B by PCR: NEGATIVE
Resp Syncytial Virus by PCR: NEGATIVE
SARS Coronavirus 2 by RT PCR: NEGATIVE

## 2022-05-13 LAB — LACTIC ACID, PLASMA
Lactic Acid, Venous: 1.2 mmol/L (ref 0.5–1.9)
Lactic Acid, Venous: 0.9 mmol/L (ref 0.5–1.9)

## 2022-05-13 LAB — GLUCOSE, CAPILLARY
Glucose-Capillary: 69 mg/dL — ABNORMAL LOW (ref 70–99)
Glucose-Capillary: 137 mg/dL — ABNORMAL HIGH (ref 70–99)

## 2022-05-13 LAB — TROPONIN I (HIGH SENSITIVITY): Troponin I (High Sensitivity): 32 ng/L — ABNORMAL HIGH (ref <18)

## 2022-05-13 LAB — CREATININE, SERUM
Creatinine, Ser: 1.77 mg/dL — ABNORMAL HIGH (ref 0.61–1.24)
GFR, Estimated: 37 mL/min — ABNORMAL LOW (ref >60)

## 2022-05-13 MED ORDER — SODIUM CHLORIDE 0.9 % IV SOLN
2.0000 g | INTRAVENOUS | Status: DC
  Administered 2022-05-13 – 2022-05-16 (×4): 2 g via INTRAVENOUS
  Filled 2022-05-13: qty 20
  Filled 2022-05-13 (×2): qty 2
  Filled 2022-05-13: qty 20

## 2022-05-13 MED ORDER — INSULIN ASPART 100 UNIT/ML IJ SOLN
0.0000 [IU] | Freq: Three times a day (TID) | INTRAMUSCULAR | Status: DC
  Administered 2022-05-14: 5 [IU] via SUBCUTANEOUS
  Administered 2022-05-14: 3 [IU] via SUBCUTANEOUS
  Administered 2022-05-14: 2 [IU] via SUBCUTANEOUS
  Administered 2022-05-15 (×2): 5 [IU] via SUBCUTANEOUS
  Administered 2022-05-15: 2 [IU] via SUBCUTANEOUS
  Administered 2022-05-16: 8 [IU] via SUBCUTANEOUS
  Administered 2022-05-16: 3 [IU] via SUBCUTANEOUS
  Administered 2022-05-16: 5 [IU] via SUBCUTANEOUS
  Administered 2022-05-17: 3 [IU] via SUBCUTANEOUS
  Administered 2022-05-17: 5 [IU] via SUBCUTANEOUS
  Filled 2022-05-13 (×9): qty 1

## 2022-05-13 MED ORDER — LACTATED RINGERS IV SOLN: INTRAVENOUS | Status: DC

## 2022-05-13 MED ORDER — ATORVASTATIN CALCIUM 10 MG PO TABS
5.0000 mg | ORAL_TABLET | Freq: Every day | ORAL | Status: DC
  Administered 2022-05-14 – 2022-05-17 (×4): 5 mg via ORAL
  Filled 2022-05-13 (×6): qty 0.5

## 2022-05-13 MED ORDER — ENOXAPARIN SODIUM 40 MG/0.4ML IJ SOSY
40.0000 mg | PREFILLED_SYRINGE | INTRAMUSCULAR | Status: DC
  Administered 2022-05-13 – 2022-05-16 (×4): 40 mg via SUBCUTANEOUS
  Filled 2022-05-13 (×4): qty 0.4

## 2022-05-13 MED ORDER — SODIUM CHLORIDE 0.9 % IV SOLN: INTRAVENOUS | Status: DC

## 2022-05-13 MED ORDER — ACETAMINOPHEN 650 MG RE SUPP: 650.0000 mg | Freq: Four times a day (QID) | RECTAL | Status: DC | PRN

## 2022-05-13 MED ORDER — ALBUTEROL SULFATE (2.5 MG/3ML) 0.083% IN NEBU: 2.5000 mg | INHALATION_SOLUTION | RESPIRATORY_TRACT | Status: DC | PRN

## 2022-05-13 MED ORDER — SODIUM CHLORIDE 0.9 % IV SOLN
500.0000 mg | INTRAVENOUS | Status: DC
  Administered 2022-05-13 – 2022-05-16 (×4): 500 mg via INTRAVENOUS
  Filled 2022-05-13: qty 500
  Filled 2022-05-13 (×2): qty 5
  Filled 2022-05-13: qty 500

## 2022-05-13 MED ORDER — ONDANSETRON HCL 4 MG PO TABS: 4.0000 mg | ORAL_TABLET | Freq: Four times a day (QID) | ORAL | Status: DC | PRN

## 2022-05-13 MED ORDER — NICOTINE 21 MG/24HR TD PT24
21.0000 mg | MEDICATED_PATCH | Freq: Every day | TRANSDERMAL | Status: DC
  Administered 2022-05-13 – 2022-05-17 (×5): 21 mg via TRANSDERMAL
  Filled 2022-05-13 (×5): qty 1

## 2022-05-13 MED ORDER — CLOPIDOGREL BISULFATE 75 MG PO TABS: 75.0000 mg | ORAL_TABLET | Freq: Every day | ORAL | Status: DC

## 2022-05-13 MED ORDER — ONDANSETRON HCL 4 MG/2ML IJ SOLN: 4.0000 mg | Freq: Four times a day (QID) | INTRAMUSCULAR | Status: DC | PRN

## 2022-05-13 MED ORDER — ACETAMINOPHEN 325 MG PO TABS
650.0000 mg | ORAL_TABLET | Freq: Four times a day (QID) | ORAL | Status: DC | PRN
  Administered 2022-05-14: 650 mg via ORAL
  Filled 2022-05-13 (×2): qty 2

## 2022-05-13 MED ORDER — HYDRALAZINE HCL 20 MG/ML IJ SOLN
5.0000 mg | INTRAMUSCULAR | Status: DC | PRN
  Administered 2022-05-15 – 2022-05-16 (×4): 5 mg via INTRAVENOUS
  Filled 2022-05-13 (×3): qty 1

## 2022-05-13 MED ORDER — GUAIFENESIN ER 600 MG PO TB12: 600.0000 mg | ORAL_TABLET | Freq: Two times a day (BID) | ORAL | Status: DC | PRN

## 2022-05-13 NOTE — Assessment & Plan Note (Addendum)
Cont CAP iv abx regimen.  Supplemental oxygen if needed.  Pt found to have LLL PNA on imaging,.

## 2022-05-13 NOTE — Assessment & Plan Note (Signed)
Accuchecks AC.HS and SSI Glycemic protocol.  Hold glipizide / Tyler Aas.

## 2022-05-13 NOTE — Progress Notes (Signed)
Patient arrived to the unit via stretcher from ED. Patient is oriented to person, DOB, Place, and that Ivor Costa is tomorrow. Patient has NS at 69mL/hr. Patient placed on telemetry and second verifier completed.  Patient has blanchable pink sacrum RN placed sacral foam. Bed locked in lowest position, call bell in reach and bed alarm in place.

## 2022-05-13 NOTE — Assessment & Plan Note (Signed)
Attribute patient's infection. Treat the underlying cause.  Monitor with neurochecks. Aspiration and fall precaution.

## 2022-05-13 NOTE — H&P (Signed)
History and Physical     Patient: Richard Chapman [id number] DOB: 09/12/1940 DOA: 05/13/2022 DOS: the patient was seen and examined on 05/14/2022 PCP: Leonel Ramsay, MD   Patient coming from: Urgent care Chief Complaint: Confusion  HISTORY OF PRESENT ILLNESS: Richard Chapman is an 82 y.o. male brought by family 55 emergency room for confusion.  Patient on arrival did not understand why he was here in the hospital.  Patient sent to [place] from urgent care for his mental status.  Patient is cooperative and nonfocal.  Speech is clear and verbalizes understanding of plan.  He does have a past medical history of CKD hypertension and prostate cancer.  Family was worried about fever and gave him Tylenol patient has been coughing for about 4 weeks but not confused until recently.  Patient noted to have difficulty with ambulation and is high fall risk.  Malodorous urine per EMS note.  Patient is a current smoker.  Confusion is acute and new source of unclear.   Past Medical History:  Diagnosis Date   Anxiety and depression    Depression    Diabetes (HCC)    Elevated PSA    GERD (gastroesophageal reflux disease)    History of nephrolithiasis    Hypertension    Skin cancer    Sleep apnea    Review of Systems  Constitutional:  Positive for chills.  Psychiatric/Behavioral:  Positive for confusion.   All other systems reviewed and are negative.  Allergies  Allergen Reactions   Lisinopril Other (See Comments) and Cough   Past Surgical History:  Procedure Laterality Date   APPENDECTOMY     BACK SURGERY     BURR HOLE FOR SUBDURAL HEMATOMA     2015   KNEE SURGERY Bilateral    NECK SURGERY     SHOULDER SURGERY     TONSILLECTOMY     WRIST SURGERY     MEDICATIONS: Prior to Admission medications   Medication Sig Start Date End Date Taking? Authorizing Provider  acetaminophen (TYLENOL) 325 MG tablet Take 2 tablets (650 mg total) every 6 (six) hours as needed by mouth for mild pain  (or Fever >/= 101). 12/18/16   Gouru, Illene Silver, MD  amLODipine (NORVASC) 10 MG tablet Take 1 tablet (10 mg total) by mouth daily. 04/07/15   Fritzi Mandes, MD  atorvastatin (LIPITOR) 10 MG tablet Take 5 mg by mouth daily.    [provider]  cetirizine (ZYRTEC) 10 MG tablet Take 10 mg by mouth daily.    [provider]  Cholecalciferol 25 MCG (1000 UT) tablet Take 1 tablet by mouth daily. 03/12/20   [provider]  clopidogrel (PLAVIX) 75 MG tablet Take 1 tablet (75 mg total) by mouth daily. 04/07/15   Fritzi Mandes, MD  docusate sodium (COLACE) 100 MG capsule Take 1 capsule (100 mg total) by mouth 2 (two) times daily. 04/19/18   Loletha Grayer, MD  escitalopram (LEXAPRO) 20 MG tablet Take 1 tablet (20 mg total) by mouth daily. Patient taking differently: Take 20 mg by mouth 2 (two) times daily. 11/26/14   Nicholes Mango, MD  esomeprazole (NEXIUM) 20 MG capsule Take 1 capsule (20 mg total) by mouth AC breakfast. 09/24/15   Fritzi Mandes, MD  famotidine (PEPCID) 20 MG tablet Take 1 tablet by mouth at bedtime. 01/21/20   [provider]  gabapentin (NEURONTIN) 300 MG capsule TAKE ONE CAPSULE BY MOUTH AT BEDTIME FOR NERVE PAIN 03/26/20   [provider]  glipiZIDE (  GLUCOTROL) 5 MG tablet Take 5 mg by mouth every morning. 09/22/21   [provider]  hyoscyamine (LEVBID) 0.375 MG 12 hr tablet Take 0.375 mg by mouth every 12 (twelve) hours.    [provider]  insulin degludec (TRESIBA) 100 UNIT/ML SOPN FlexTouch Pen Inject 20 Units into the skin daily. 02/22/18   [provider]  Ipratropium-Albuterol (COMBIVENT RESPIMAT) 20-100 MCG/ACT AERS respimat Inhale 1 puff into the lungs every 6 (six) hours. 12/20/20   Lorella Nimrod, MD  isosorbide mononitrate (IMDUR) 60 MG 24 hr tablet Take 1 tablet (60 mg total) by mouth daily. 04/19/18   Loletha Grayer, MD  losartan (COZAAR) 100 MG tablet Take 100 mg by mouth daily.     [provider]  magnesium  oxide (MAG-OX) 400 MG tablet Take 400 mg by mouth daily.     [provider]  nitroGLYCERIN (NITROSTAT) 0.4 MG SL tablet Place 0.4 mg under the tongue every 5 (five) minutes as needed for chest pain.     [provider]  Nutritional Supplements (FEEDING SUPPLEMENT, GLUCERNA 1.2 CAL,) LIQD Take by mouth. 12/05/19   [provider]  Roma Schanz test strip SMARTSIG:1 Each Via Meter Daily 03/03/20   [provider]  primidone (MYSOLINE) 50 MG tablet Take 1 tablet by mouth 2 (two) times daily. 11/04/14   [provider]  propranolol (INDERAL) 40 MG tablet TAKE ONE-HALF TABLET BY MOUTH TWO TIMES A DAY FOR BLOOD PRESSURE 02/25/20   [provider]  tamsulosin (FLOMAX) 0.4 MG CAPS capsule Take 0.4 mg by mouth daily.     [provider]  traZODone (DESYREL) 100 MG tablet Take 1 tablet (100 mg total) by mouth at bedtime. 04/19/18   Loletha Grayer, MD  vitamin B-12 (CYANOCOBALAMIN) 1000 MCG tablet Take 1,000 mcg by mouth daily.     [provider]  white petrolatum (VASELINE) OINT Apply 1 application topically as needed (Wound care). 04/19/18   Loletha Grayer, MD     sodium chloride 50 mL/hr at 05/13/22 2334   azithromycin Stopped (05/13/22 2222)   cefTRIAXone (ROCEPHIN)  IV Stopped (05/13/22 2115)   ED Course: Pt in Ed is alert cooperative afebrile.  patient meets sepsis criteria and code sepsis activated. Vitals:   05/14/22 0011 05/14/22 0116 05/14/22 0300 05/14/22 0455  BP: (!) 149/77   (!) 149/59  Pulse: 63   63  Resp: 18 17  20   Temp: (!) 101.3 F (38.5 C) 100.3 F (37.9 C) 99.4 F (37.4 C) 98.5 F (36.9 C)  TempSrc:      SpO2: 97%   97%  Weight:      Height:       Total I/O In: 521.2 [I.V.:171; IV Piggyback:350.2] Out: -  SpO2: 97 % Blood work in ed shows: Sodium 2 serum bicarb 20, CKD stage III GFR 39 Troponin 32. Leukocytosis of 20,000 with normal platelet counts. Ct chest show LLL bronchopneumonia.  In ed  pt received  CAP coverage with Rocephin azithromycin .  Results for orders placed or performed during the hospital encounter of 05/13/22 (from the past 72 hour(s))  CBC     Status: Abnormal   Collection Time: 05/13/22  4:19 PM  Result Value Ref Range   WBC 20.0 (H) 4.0 - 10.5 K/uL   RBC 4.89 4.22 - 5.81 MIL/uL   Hemoglobin 14.7 13.0 - 17.0 g/dL   HCT 42.9 39.0 - 52.0 %   MCV 87.7 80.0 - 100.0 fL   MCH 30.1  26.0 - 34.0 pg   MCHC 34.3 30.0 - 36.0 g/dL   RDW 13.4 11.5 - 15.5 %   Platelets 183 150 - 400 K/uL   nRBC 0.0 0.0 - 0.2 %    Comment: Performed at [facility], [address] [place], [place] [address]  Comprehensive metabolic panel     Status: Abnormal   Collection Time: 05/13/22  4:19 PM  Result Value Ref Range   Sodium 131 (L) 135 - 145 mmol/L   Potassium 3.9 3.5 - 5.1 mmol/L   Chloride 102 98 - 111 mmol/L   CO2 21 (L) 22 - 32 mmol/L   Glucose, Bld 162 (H) 70 - 99 mg/dL    Comment: Glucose reference range applies only to samples taken after fasting for at least 8 hours.   BUN 36 (H) 8 - 23 mg/dL   Creatinine, Ser 1.72 (H) 0.61 - 1.24 mg/dL   Calcium 8.5 (L) 8.9 - 10.3 mg/dL   Total Protein 6.4 (L) 6.5 - 8.1 g/dL   Albumin 3.4 (L) 3.5 - 5.0 g/dL   AST 20 15 - 41 U/L   ALT 19 0 - 44 U/L   Alkaline Phosphatase 65 38 - 126 U/L   Total Bilirubin 0.9 0.3 - 1.2 mg/dL   GFR, Estimated 39 (L) >60 mL/min    Comment: (NOTE) Calculated using the CKD-EPI Creatinine Equation (2021)    Anion gap 8 5 - 15    Comment: Performed at [facility], [place], [place] [address]  Troponin I (High Sensitivity)     Status: Abnormal   Collection Time: 05/13/22  4:19 PM  Result Value Ref Range   Troponin I (High Sensitivity) 32 (H) <18 ng/L    Comment: (NOTE) Elevated high sensitivity troponin I (hsTnI) values and significant  changes across serial measurements may suggest ACS but many other  chronic and acute conditions are known to elevate hsTnI  results.  Refer to the "Links" section for chest pain algorithms and additional  guidance. Performed at [facility], [address] [place], [place] [address]   Resp panel by RT-PCR (RSV, Flu A&B, Covid) Anterior Nasal Swab     Status: None   Collection Time: 05/13/22  4:19 PM   Specimen: Anterior Nasal Swab  Result Value Ref Range   SARS Coronavirus 2 by RT PCR NEGATIVE NEGATIVE    Comment: (NOTE) SARS-CoV-2 target nucleic acids are NOT DETECTED.  The SARS-CoV-2 RNA is generally detectable in upper respiratory specimens during the acute phase of infection. The lowest concentration of SARS-CoV-2 viral copies this assay can detect is 138 copies/mL. A negative result does not preclude SARS-Cov-2 infection and should not be used as the sole basis for treatment or other patient management decisions. A negative result may occur with  improper specimen collection/handling, submission of specimen other than nasopharyngeal swab, presence of viral mutation(s) within the areas targeted by this assay, and inadequate number of viral copies(<138 copies/mL). A negative result must be combined with clinical observations, patient history, and epidemiological information. The expected result is Negative.  Fact Sheet for Patients:  EntrepreneurPulse.com.au  Fact Sheet for Healthcare Providers:  IncredibleEmployment.be  This test is no t yet approved or cleared by the [place] FDA and  has been authorized for detection and/or diagnosis of SARS-CoV-2 by FDA under an Emergency Use Authorization (EUA). This EUA will remain  in effect (meaning this test can be used) for the duration of the COVID-19 declaration under  Section 564(b)(1) of the Act, 21 U.S.C.section 360bbb-3(b)(1), unless the authorization is terminated  or revoked sooner.       Influenza A by PCR NEGATIVE NEGATIVE   Influenza B by PCR NEGATIVE NEGATIVE    Comment: (NOTE) The  Xpert Xpress SARS-CoV-2/FLU/RSV plus assay is intended as an aid in the diagnosis of influenza from Nasopharyngeal swab specimens and should not be used as a sole basis for treatment. Nasal washings and aspirates are unacceptable for Xpert Xpress SARS-CoV-2/FLU/RSV testing.  Fact Sheet for Patients: EntrepreneurPulse.com.au  Fact Sheet for Healthcare Providers: IncredibleEmployment.be  This test is not yet approved or cleared by the [place] FDA and has been authorized for detection and/or diagnosis of SARS-CoV-2 by FDA under an Emergency Use Authorization (EUA). This EUA will remain in effect (meaning this test can be used) for the duration of the COVID-19 declaration under Section 564(b)(1) of the Act, 21 U.S.C. section 360bbb-3(b)(1), unless the authorization is terminated or revoked.     Resp Syncytial Virus by PCR NEGATIVE NEGATIVE    Comment: (NOTE) Fact Sheet for Patients: EntrepreneurPulse.com.au  Fact Sheet for Healthcare Providers: IncredibleEmployment.be  This test is not yet approved or cleared by the [place] FDA and has been authorized for detection and/or diagnosis of SARS-CoV-2 by FDA under an Emergency Use Authorization (EUA). This EUA will remain in effect (meaning this test can be used) for the duration of the COVID-19 declaration under Section 564(b)(1) of the Act, 21 U.S.C. section 360bbb-3(b)(1), unless the authorization is terminated or revoked.  Performed at [facility], [address] [place], [place] [address]   Urinalysis, Complete w Microscopic -Urine, Clean Catch     Status: Abnormal   Collection Time: 05/13/22  6:13 PM  Result Value Ref Range   Color, Urine YELLOW (A) YELLOW   APPearance CLEAR (A) CLEAR   Specific Gravity, Urine 1.016 1.005 - 1.030   pH 5.0 5.0 - 8.0   Glucose, UA 50 (A) NEGATIVE mg/dL   Hgb urine dipstick SMALL (A) NEGATIVE    Bilirubin Urine NEGATIVE NEGATIVE   Ketones, ur NEGATIVE NEGATIVE mg/dL   Protein, ur >=300 (A) NEGATIVE mg/dL   Nitrite NEGATIVE NEGATIVE   Leukocytes,Ua NEGATIVE NEGATIVE   RBC / HPF 0-5 0 - 5 RBC/hpf   WBC, UA 0-5 0 - 5 WBC/hpf   Bacteria, UA NONE SEEN NONE SEEN   Squamous Epithelial / HPF 0-5 0 - 5 /HPF   Mucus PRESENT     Comment: Performed at [facility], [address] [place], [place] [address]  Lactic acid, plasma     Status: None   Collection Time: 05/13/22  8:26 PM  Result Value Ref Range   Lactic Acid, Venous 1.2 0.5 - 1.9 mmol/L    Comment: Performed at [facility], [address] [place], [place] [address]  CBC     Status: Abnormal   Collection Time: 05/13/22  8:26 PM  Result Value Ref Range   WBC 22.4 (H) 4.0 - 10.5 K/uL   RBC 4.74 4.22 - 5.81 MIL/uL   Hemoglobin 14.4 13.0 - 17.0 g/dL   HCT 41.3 39.0 - 52.0 %   MCV 87.1 80.0 - 100.0 fL   MCH 30.4 26.0 - 34.0 pg   MCHC 34.9 30.0 - 36.0 g/dL   RDW 13.7 11.5 - 15.5 %   Platelets 183 150 - 400 K/uL   nRBC 0.0 0.0 - 0.2 %    Comment: Performed at [facility], [address] [place], [place] [address]  Creatinine, serum     Status: Abnormal   Collection Time: 05/13/22  8:26 PM  Result Value Ref Range   Creatinine, Ser 1.77 (H) 0.61 - 1.24 mg/dL   GFR, Estimated 37 (L) >60 mL/min    Comment: (NOTE) Calculated using the CKD-EPI Creatinine Equation (2021) Performed at [facility], [address] [place], [place] [address]   Glucose, capillary     Status: Abnormal   Collection Time: 05/13/22 10:00 PM  Result Value Ref Range   Glucose-Capillary 69 (L) 70 - 99 mg/dL    Comment: Glucose reference range applies only to samples taken after fasting for at least 8 hours.  Lactic acid, plasma     Status: None   Collection Time: 05/13/22 10:04 PM  Result Value Ref Range   Lactic Acid, Venous 0.9 0.5 - 1.9 mmol/L    Comment: Performed at [facility], [address] [place], [place] [address]  Glucose, capillary     Status: Abnormal   Collection Time: 05/13/22 10:44 PM  Result Value Ref Range   Glucose-Capillary 137 (H) 70 - 99 mg/dL    Comment: Glucose reference range applies only to samples taken after fasting for at least 8 hours.  Glucose, capillary     Status: None   Collection Time: 05/14/22 12:18 AM  Result Value Ref Range   Glucose-Capillary 96 70 - 99 mg/dL    Comment: Glucose reference range applies only to samples taken after fasting for at least 8 hours.  Glucose, capillary     Status: Abnormal   Collection Time: 05/14/22  4:46 AM  Result Value Ref Range   Glucose-Capillary 60 (L) 70 - 99 mg/dL    Comment: Glucose reference range applies only to samples taken after fasting for at least 8 hours.  Glucose, capillary     Status: None   Collection Time: 05/14/22  5:07 AM  Result Value Ref Range   Glucose-Capillary 95 70 - 99 mg/dL    Comment: Glucose reference range applies only to samples taken after fasting for at least 8 hours.    Lab Results  Component Value Date   CREATININE 1.77 (H) 05/13/2022   CREATININE 1.72 (H) 05/13/2022   CREATININE 1.60 (H) 12/20/2020      Latest Ref Rng & Units 05/13/2022    8:26 PM 05/13/2022    4:19 PM 12/20/2020    6:40 AM  CMP  Glucose 70 - 99 mg/dL  162  268   BUN 8 - 23 mg/dL  36  40   Creatinine 0.61 - 1.24 mg/dL 1.77  1.72  1.60   Sodium 135 - 145 mmol/L  131  135   Potassium 3.5 - 5.1 mmol/L  3.9  3.5   Chloride 98 - 111 mmol/L  102  106   CO2 22 - 32 mmol/L  21  21   Calcium 8.9 - 10.3 mg/dL  8.5  7.9   Total Protein 6.5 - 8.1 g/dL  6.4    Total Bilirubin 0.3 - 1.2 mg/dL  0.9    Alkaline Phos 38 - 126 U/L  65    AST 15 - 41 U/L  20    ALT 0 - 44 U/L  19     Unresulted Labs (From admission, onward)     Start     Ordered   05/20/22 0500  Creatinine, serum  (enoxaparin (LOVENOX)    CrCl >/= 30 ml/min)  Weekly,  STAT     Comments: while on enoxaparin therapy    05/13/22 2020    05/14/22 0500  CBC with Differential/Platelet  Tomorrow morning,   R        05/13/22 2058   05/14/22 [id number]  Basic metabolic panel  Tomorrow morning,   R        05/13/22 2058   05/13/22 2112  Hemoglobin A1c  Add-on,   AD        05/13/22 2111   05/13/22 1930  Blood culture (routine x 2)  BLOOD CULTURE X 2,   STAT      05/13/22 1929           Pt has received : Orders Placed This Encounter  Procedures   Resp panel by RT-PCR (RSV, Flu A&B, Covid) Anterior Nasal Swab    Standing Status:   Standing    Number of Occurrences:   1   Blood culture (routine x 2)    Standing Status:   Standing    Number of Occurrences:   2   CT HEAD WO CONTRAST (5MM)    Standing Status:   Standing    Number of Occurrences:   1   DG Chest Portable 1 View    Standing Status:   Standing    Number of Occurrences:   1    Order Specific Question:   Reason for Exam (SYMPTOM  OR DIAGNOSIS REQUIRED)    Answer:   cough   CT CHEST WO CONTRAST    Standing Status:   Standing    Number of Occurrences:   1   CBC    Standing Status:   Standing    Number of Occurrences:   1   Comprehensive metabolic panel    Standing Status:   Standing    Number of Occurrences:   1   Urinalysis, Complete w Microscopic -Urine, Clean Catch    Standing Status:   Standing    Number of Occurrences:   1    Order Specific Question:   Specimen Source    Answer:   Urine, Clean Catch [76]   Lactic acid, plasma    Standing Status:   Standing    Number of Occurrences:   2   CBC    Baseline for enoxaparin therapy IF NOT ALREADY DRAWN.  Notify MD if PLT < 100 K.    Standing Status:   Standing    Number of Occurrences:   1   Creatinine, serum    Baseline for enoxaparin therapy IF NOT ALREADY DRAWN.    Standing Status:   Standing    Number of Occurrences:   1   Creatinine, serum    while on enoxaparin therapy    Standing Status:   Standing    Number of Occurrences:   3   CBC with Differential/Platelet    Standing Status:   Standing     Number of Occurrences:   1   Basic metabolic panel    Standing Status:   Standing    Number of Occurrences:   1   Hemoglobin A1c    Standing Status:   Standing    Number of Occurrences:   1   Glucose, capillary    Standing Status:   Standing    Number of Occurrences:   1   Glucose, capillary    Standing Status:   Standing    Number of Occurrences:   1   Glucose, capillary  Standing Status:   Standing    Number of Occurrences:   1   Glucose, capillary    Standing Status:   Standing    Number of Occurrences:   1   Glucose, capillary    Standing Status:   Standing    Number of Occurrences:   1   Diet heart healthy/carb modified Room service appropriate? Yes; Fluid consistency: Thin    Standing Status:   Standing    Number of Occurrences:   1    Order Specific Question:   Diet-HS Snack?    Answer:   Nothing    Order Specific Question:   Room service appropriate?    Answer:   Yes    Order Specific Question:   Fluid consistency:    Answer:   Thin   DO NOT delay antibiotics if unable to obtain blood culture.    Standing Status:   Standing    Number of Occurrences:   1   Apply Diabetes Mellitus Care Plan    Standing Status:   Standing    Number of Occurrences:   1   STAT CBG when hypoglycemia is suspected. If treated, recheck every 15 minutes after each treatment until CBG >/= 70 mg/dl    Standing Status:   Standing    Number of Occurrences:   1   Refer to Hypoglycemia Protocol Sidebar Report for treatment of CBG < 70 mg/dl    Standing Status:   Standing    Number of Occurrences:   1   No HS correction Insulin    Standing Status:   Standing    Number of Occurrences:   1   Apply Pneumonia Care Plan    Standing Status:   Standing    Number of Occurrences:   1   Cardiac Monitoring Continuous x 24 hours Indications for use: Other; other indications for use: PNA    Standing Status:   Standing    Number of Occurrences:   1    Order Specific Question:   Indications for use:     Answer:   Other    Order Specific Question:   other indications for use:    Answer:   PNA   Vital signs    Standing Status:   Standing    Number of Occurrences:   1   Notify physician (specify)    Standing Status:   Standing    Number of Occurrences:   20    Order Specific Question:   Notify Physician    Answer:   for pulse less than 55 or greater than 120    Order Specific Question:   Notify Physician    Answer:   for respiratory rate less than 12 or greater than 25    Order Specific Question:   Notify Physician    Answer:   for temperature greater than 100.5 F    Order Specific Question:   Notify Physician    Answer:   for urinary output less than 30 mL/hr for four hours    Order Specific Question:   Notify Physician    Answer:   for systolic BP less than 90 or greater than 0000000, diastolic BP less than 60 or greater than 100    Order Specific Question:   Notify Physician    Answer:   for new hypoxia w/ oxygen saturations < 88%   Progressive Mobility Protocol: No Restrictions    Standing Status:   Standing    Number  of Occurrences:   1   If patient diabetic or glucose greater than 140 notify physician for Sliding Scale Insulin Orders    Standing Status:   Standing    Number of Occurrences:   20   Intake and Output    Standing Status:   Standing    Number of Occurrences:   1   Initiate Oral Care Protocol    Standing Status:   Standing    Number of Occurrences:   1   Initiate Carrier Fluid Protocol    Standing Status:   Standing    Number of Occurrences:   1   RN may order General Admission PRN Orders utilizing "General Admission PRN medications" (through manage orders) for the following patient needs: allergy symptoms (Claritin), cold sores (Carmex), cough (Robitussin DM), eye irritation (Liquifilm Tears), hemorrhoids (Tucks), indigestion (Maalox), minor skin irritation (Hydrocortisone Cream), muscle pain Suezanne Jacquet Gay), nose irritation (saline nasal spray) and sore throat (Chloraseptic  spray).    Standing Status:   Standing    Number of Occurrences:   [id number]   Full code    Standing Status:   Standing    Number of Occurrences:   1    Order Specific Question:   By:    Answer:   Other   Code Sepsis activation.  This occurs automatically when order is signed and prioritizes pharmacy, lab, and radiology services for STAT collections and interventions.  If [facility] downtime, call [facility] [phone]) to activate Code Sepsis.    Standing Status:   Standing    Number of Occurrences:   1    Order Specific Question:   Initiate "Code Sepsis" tracking    Answer:   Yes    Order Specific Question:   Contact eLink [phone])    Answer:   No    Order Specific Question:   Reason for Consult?    Answer:   tracking   pharmacy consult    Standing Status:   Standing    Number of Occurrences:   1    Order Specific Question:   Reason for Consult (*indicate specifics in comment field)    Answer:   Other (see comment)    Order Specific Question:   Comment:    Answer:   Treatment for SEPSIS has been initiated. Follow patient to expedite timely antibiotic and fluid administration.   Consult to hospitalist    Standing Status:   Standing    Number of Occurrences:   1    Order Specific Question:   Place call to:    Answer:   [facility]    Order Specific Question:   Reason for Consult    Answer:   Admit    Order Specific Question:   Diagnosis/Clinical Info for Consult:    Answer:   sepsis/CAP   Inpatient consult to spiritual care    Standing Status:   Standing    Number of Occurrences:   1    Order Specific Question:   Reason for Consult    Answer:   Patient requests prayer   Consult to Transition of Care    Safety bars in patient bathroom    Standing Status:   Standing    Number of Occurrences:   1    Order Specific Question:   Reason for Consult:    Answer:   Home Health / DME Needs   OT eval and treat    Standing Status:   Standing    Number of Occurrences:  1   PT eval and treat     Standing Status:   Standing    Number of Occurrences:   1   Pulse oximetry check with vital signs    Standing Status:   Standing    Number of Occurrences:   1   Oxygen therapy Mode or (Route): Nasal cannula; Liters Per Minute: 2; Keep 02 saturation: greater than 92 %    Standing Status:   Standing    Number of Occurrences:   1    Order Specific Question:   Mode or (Route)    Answer:   Nasal cannula    Order Specific Question:   Liters Per Minute    Answer:   2    Order Specific Question:   Keep 02 saturation    Answer:   greater than 92 %   Incentive spirometry    Standing Status:   Standing    Number of Occurrences:   1   Insert 2nd peripheral IV if not already present.    Standing Status:   Standing    Number of Occurrences:   20   Place in observation (patient's expected length of stay will be less than 2 midnights)    Standing Status:   Standing    Number of Occurrences:   1    Order Specific Question:   Hospital Area    Answer:   [place] [[id number]]    Order Specific Question:   Level of Care    Answer:   Telemetry Medical [104]    Order Specific Question:   Covid Evaluation    Answer:   Asymptomatic - no recent exposure (last 10 days) testing not required    Order Specific Question:   Diagnosis    Answer:   Confusion and disorientation [id number]    Order Specific Question:   Admitting Physician    Answer:   Cherylann Ratel    Order Specific Question:   Attending Physician    Answer:   Cherylann Ratel   Aspiration precautions    Standing Status:   Standing    Number of Occurrences:   1   Fall precautions    Standing Status:   Standing    Number of Occurrences:   1    Meds ordered this encounter  Medications   DISCONTD: lactated ringers infusion   cefTRIAXone (ROCEPHIN) 2 g in sodium chloride 0.9 % 100 mL IVPB    Order Specific Question:   Antibiotic Indication:    Answer:   CAP   azithromycin (ZITHROMAX) 500 mg in sodium  chloride 0.9 % 250 mL IVPB    Order Specific Question:   Antibiotic Indication:    Answer:   CAP   atorvastatin (LIPITOR) tablet 5 mg   DISCONTD: clopidogrel (PLAVIX) tablet 75 mg   insulin aspart (novoLOG) injection 0-15 Units    Order Specific Question:   Correction coverage:    Answer:   Moderate (average weight, post-op)    Order Specific Question:   CBG < 70:    Answer:   implement hypoglycemia protocol    Order Specific Question:   CBG 70 - 120:    Answer:   0 units    Order Specific Question:   CBG 121 - 150:    Answer:   2 units    Order Specific Question:   CBG 151 - 200:    Answer:   3 units    Order  Specific Question:   CBG 201 - 250:    Answer:   5 units    Order Specific Question:   CBG 251 - 300:    Answer:   8 units    Order Specific Question:   CBG 301 - 350:    Answer:   11 units    Order Specific Question:   CBG 351 - 400:    Answer:   15 units    Order Specific Question:   CBG > 400    Answer:   call MD and obtain STAT lab verification   enoxaparin (LOVENOX) injection 40 mg   DISCONTD: 0.9 %  sodium chloride infusion   OR Linked Order Group    acetaminophen (TYLENOL) tablet 650 mg    acetaminophen (TYLENOL) suppository 650 mg   OR Linked Order Group    ondansetron (ZOFRAN) tablet 4 mg    ondansetron (ZOFRAN) injection 4 mg   albuterol (PROVENTIL) (2.5 MG/3ML) 0.083% nebulizer solution 2.5 mg   guaiFENesin (MUCINEX) 12 hr tablet 600 mg   hydrALAZINE (APRESOLINE) injection 5 mg   nicotine (NICODERM CQ - dosed in mg/24 hours) patch 21 mg   0.9 %  sodium chloride infusion   clopidogrel (PLAVIX) tablet 75 mg   isosorbide mononitrate (IMDUR) 24 hr tablet 60 mg    Admission Imaging : CT CHEST WO CONTRAST  Result Date: 05/13/2022 CLINICAL DATA:  Cough, respiratory illness EXAM: CT CHEST WITHOUT CONTRAST TECHNIQUE: Multidetector CT imaging of the chest was performed following the standard protocol without IV contrast. RADIATION DOSE REDUCTION: This exam was  performed according to the departmental dose-optimization program which includes automated exposure control, adjustment of the mA and/or kV according to patient size and/or use of iterative reconstruction technique. COMPARISON:  05/13/2022, 05/12/2020 FINDINGS: Cardiovascular: Unenhanced imaging of the heart is unremarkable without pericardial effusion. Normal caliber of the thoracic aorta. Atherosclerosis of the aorta and coronary vasculature. Evaluation of the vascular lumen is limited without IV contrast. Mediastinum/Nodes: Stable 3.1 cm right lobe thyroid nodule that was previously biopsied. Trachea and esophagus are unremarkable. No pathologic adenopathy. Lungs/Pleura: Patchy left lower lobe airspace disease consistent with bronchopneumonia. Mild bibasilar bronchial wall thickening. No effusion or pneumothorax. Central airways are patent. Stable benign 3 mm right upper lobe pulmonary nodule reference image 59/2. Upper Abdomen: No acute upper abdominal finding. Musculoskeletal: There are no acute displaced fractures. Prior healed lower left rib fractures are noted. Postsurgical changes from lower cervical ACDF. 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 Reconstructed images demonstrate no additional findings. IMPRESSION: 1. Bibasilar bronchial wall thickening, with left lower lobe consolidation consistent with bronchopneumonia. Follow up after appropriate medical management is recommended to document resolution. 2. Aortic Atherosclerosis (ICD10-I70.0). Coronary artery atherosclerosis. Electronically Signed   By: Randa Ngo M.D.   On: 05/13/2022 19:19   CT HEAD WO CONTRAST (5MM)  Result Date: 05/13/2022 CLINICAL DATA:  Altered mental status EXAM: CT HEAD WITHOUT CONTRAST TECHNIQUE: Contiguous axial images were obtained from the base of the skull through the vertex without intravenous contrast. RADIATION DOSE REDUCTION: This exam was performed according to the departmental  dose-optimization program which includes automated exposure control, adjustment of the mA and/or kV according to patient size and/or use of iterative reconstruction technique. COMPARISON:  CT Head 01/10/21 FINDINGS: Brain: No evidence of acute infarction, hemorrhage, hydrocephalus, extra-axial collection or mass lesion/mass effect. Sequela of severe chronic microvascular ischemic change, unchanged from prior exam. Advanced generalized volume loss. 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 There is very mild asymmetric dural thickening in the dural overlying the left-sided mastoid air cells (series 4, image 44/series 5, image 51). Vascular: No hyperdense vessel or unexpected calcification. Skull: Normal. Negative for fracture or focal lesion. Sinuses/Orbits: Left-sided mastoid effusion and left middle ear effusion. Polypoid mucosal thickening in the bilateral maxillary sinuses. Bilateral lens replacement orbits are otherwise unremarkable. Other: None. IMPRESSION: 1. No acute intracranial abnormality. 2. Left-sided mastoid effusion and left middle ear effusion. Correlate for signs and symptoms of otomastoiditis. 3. Very mild asymmetric dural thickening overlying the left-sided mastoid air cells, which may be reactive. Electronically Signed   By: Marin Roberts M.D.   On: 05/13/2022 17:23   DG Chest Portable 1 View  Result Date: 05/13/2022 CLINICAL DATA:  cough EXAM: PORTABLE CHEST - 1 VIEW COMPARISON:  12/18/2020 FINDINGS: Lower lung volumes with some patchy atelectasis or infiltrate in the infrahilar regions and lung bases, left greater than right. No overt interstitial edema. Heart size and mediastinal contours are within normal limits. Aortic Atherosclerosis (ICD10-170.0). No effusion. Cervical fixation hardware. IMPRESSION: Low volumes with bibasilar atelectasis or infiltrates, left greater than right. Electronically  Signed   By: Lucrezia Europe M.D.   On: 05/13/2022 16:21   Physical Examination: Vitals:   05/14/22 0011 05/14/22 0116 05/14/22 0300 05/14/22 0455  BP: (!) 149/77   (!) 149/59  Pulse: 63   63  Temp: (!) 101.3 F (38.5 C) 100.3 F (37.9 C) 99.4 F (37.4 C) 98.5 F (36.9 C)  Resp: 18 17  20   Height:      Weight:      SpO2: 97%   97%  TempSrc:      BMI (Calculated):       Physical Exam Vitals and nursing note reviewed.  Constitutional:      General: He is not in acute distress.    Appearance: Normal appearance. He is obese. He is not ill-appearing, toxic-appearing or diaphoretic.  HENT:     Head: Normocephalic and atraumatic.     Right Ear: Hearing and external ear normal.     Left Ear: Hearing and external ear normal.     Nose: Nose normal. No nasal deformity.     Mouth/Throat:     Lips: Pink.     Mouth: Mucous membranes are moist.     Tongue: No lesions.     Pharynx: Oropharynx is clear.  Cardiovascular:     Rate and Rhythm: Regular rhythm. Tachycardia present.     Pulses: Normal pulses.     Heart sounds: Normal heart sounds.  Pulmonary:     Effort: Pulmonary effort is normal.     Breath sounds: Wheezing present.  Abdominal:     General: Bowel sounds are normal. There is no distension.     Palpations: Abdomen is soft. There is no mass.     Tenderness: There is no abdominal tenderness. There is no guarding.     Hernia: No hernia is present.  Musculoskeletal:     Right lower leg: No edema.     Left lower leg: No edema.  Skin:    General: Skin is warm.  Neurological:     General: No focal deficit present.     Mental Status: He is alert and oriented to person, place, and time.     Cranial Nerves: Cranial nerves 2-12 are intact.     Motor: Motor function is intact.  Psychiatric:  Attention and Perception: Attention normal.        Mood and Affect: Mood normal.        Speech: Speech normal.        Behavior: Behavior normal. Behavior is cooperative.         Cognition and Memory: Cognition normal.     Assessment and Plan: * Confusion and disorientation Attribute patient's infection. Treat the underlying cause.  Monitor with neurochecks. Aspiration and fall precaution.  Sepsis ([place]) Vitals:   05/14/22 0011 05/14/22 0116 05/14/22 0300 05/14/22 0455  BP: (!) 149/77   (!) 149/59  Pulse: 63   63  Resp: 18 17  20   Temp: (!) 101.3 F (38.5 C) 100.3 F (37.9 C) 99.4 F (37.4 C) 98.5 F (36.9 C)  TempSrc:      SpO2: 97%   97%  Weight:      Height:      Pt meets sepsis criteria. We will continue patient on rocephin and azithromycin.   Mastoid disorder, left Ct imaging shows left mastoid effusion. ENT consult per AM team.   Tobacco abuse Nicotine patch .   HTN (hypertension) Vitals:   05/13/22 1600 05/13/22 1636 05/13/22 1730 05/13/22 1800  BP: (!) 159/65 (!) 151/77 134/86 (!) 154/67   05/13/22 1830  BP: (!) 148/74  Cont pt's  Imdur.  Hold losartan and amlodipine.  Hold propranolol.    Type II diabetes mellitus with renal manifestations (HCC) Accuchecks AC.HS and SSI Glycemic protocol.  Hold glipizide / Tyler Aas.    CKD (chronic kidney disease), stage IIIb Lab Results  Component Value Date   CREATININE 1.72 (H) 05/13/2022   CREATININE 1.60 (H) 12/20/2020   CREATININE 1.79 (H) 12/18/2020  Follow and avoid contrast.    CAP (community acquired pneumonia) Cont CAP iv abx regimen.  Supplemental oxygen if needed.  Pt found to have LLL PNA on imaging,.   Gastro-esophageal reflux disease without esophagitis Continue pepcid.     DVT prophylaxis:  Heparin.   Code Status:  Full Code.       05/13/2022   10:37 PM  Advanced Directives  Does Patient Have a Medical Advance Directive? Yes  Type of Paramedic of [place];Living will  Does patient want to make changes to medical advance directive? No - Guardian declined    Family Communication:  None.    Emergency Contact: Contact  Information     Name Relation Home Work [place] [phone]  [phone]   Chukwuemeka, Carra [phone]  [phone]   Ilija, Chezem   Painter Other [phone]        Disposition Plan:  Home. Consults: None. Admission status: Observation.   Unit / Expected LOS: Med Tele/ 2 days.    Para Skeans MD [facility]  6 PM- 2 AM. [phone]( Pager ) Please use [email or web address] to contact the current [facility] MD  based on hours  and team and services OR You may call [phone] to contact current Assigned [facility] Attending/Consulting MD for this patient.  This number is the [facility] ADMIT/Consult system  and will be able to assist any patient in any [facility] location .

## 2022-05-13 NOTE — ED Notes (Signed)
Patient is being discharged from the Urgent Care and sent to the Emergency Department via EMS . Per Dr. Marsa Aris, patient is in need of higher level of care due to Altered Mental Status. Patient is aware and verbalizes understanding of plan of care.  Vitals:   05/13/22 1506  BP: 117/62  Pulse: 92  SpO2: 94%

## 2022-05-13 NOTE — Assessment & Plan Note (Signed)
Vitals:   05/13/22 1600 05/13/22 1636 05/13/22 1730 05/13/22 1800  BP: (!) 159/65 (!) 151/77 134/86 (!) 154/67   05/13/22 1830  BP: (!) 148/74  Cont pt's  Imdur.  Hold losartan and amlodipine.  Hold propranolol.

## 2022-05-13 NOTE — Assessment & Plan Note (Signed)
Lab Results  Component Value Date   CREATININE 1.72 (H) 05/13/2022   CREATININE 1.60 (H) 12/20/2020   CREATININE 1.79 (H) 12/18/2020  Follow and avoid contrast.

## 2022-05-13 NOTE — ED Triage Notes (Signed)
Pt is with his wife and his sons girlfriend  Pt blood sugar 187  Pt is confused and does not know why he is here.  Pt stated that he was worried about his arm because "nothing is on it".  Pt family states that he has felt warm and has been shaking.   Pt states that he has indigestion and that his arms hurt. Pt family states that he stumbled and hit his arm against the wall.

## 2022-05-13 NOTE — Assessment & Plan Note (Signed)
Nicotine patch if currently still smoking.

## 2022-05-13 NOTE — ED Notes (Signed)
Patient transported to CT 

## 2022-05-13 NOTE — Progress Notes (Signed)
IV consult received for 2nd IV placement. Pt has sufficient IV access for meds ordered at this time. OK for pt to have only one IV per MD Posey Pronto. Primary RN notified and to place consult if IV needs change.

## 2022-05-13 NOTE — Assessment & Plan Note (Signed)
Continue pepcid

## 2022-05-13 NOTE — ED Notes (Signed)
Pt brief changed due to incontinence, shoes and sweat pants placed in belongings bag and given to wife.

## 2022-05-13 NOTE — Consult Note (Signed)
CODE SEPSIS - PHARMACY COMMUNICATION  **Broad Spectrum Antibiotics should be administered within 1 hour of Sepsis diagnosis**  Time Code Sepsis Called/Page Received: 1934  Antibiotics Ordered: ceftriaxone and azithromycin  Time of 1st antibiotic administration: 2028  Additional action taken by pharmacy: N/A   Lorin Picket ,PharmD Clinical Pharmacist  05/13/2022  7:31 PM

## 2022-05-13 NOTE — ED Provider Notes (Signed)
[facility]    CSN: [id number] Arrival date & time: 05/13/22  1440      History   Chief Complaint Chief Complaint  Patient presents with   Altered Mental Status    HPI  82 year old Chapman presents with altered mental status.  He has a past medical history of diabetes, chronic kidney disease, hypertension, prostate cancer.  Family members state that both of his hands have been shaky today.  He has felt warm.  They are concerned about potential fever.  Have given him Tylenol.  They report that he has had a cough for 4 weeks.  He is not confused at baseline.  He is confused here today.  He is alert but not oriented.  Additionally, they state that he has had difficulty ambulating today.  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   Malignant neoplasm of prostate ([place]) 01/29/2015   Cervical disc disease 12/03/2014   Benign fibroma of prostate 08/20/2014   Major depressive disorder with single episode 08/20/2014   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   Urge incontinence 04/23/2014   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 Authorizing Provider  acetaminophen (TYLENOL) 325 MG tablet Take 2 tablets (650 mg total) every 6 (six) hours as needed by mouth for mild pain (or Fever >/= 101). 12/18/16  Yes Gouru, Illene Silver, MD  amLODipine (NORVASC) 10 MG tablet Take 1 tablet (10 mg total) by mouth daily. 04/07/15  Yes Fritzi Mandes, MD  atorvastatin (LIPITOR) 10 MG tablet Take 5 mg by mouth daily.   Yes [provider]  cetirizine (ZYRTEC) 10 MG tablet Take 10 mg by mouth daily.   Yes [provider]  Cholecalciferol 25 MCG (1000 UT) tablet Take 1 tablet by mouth daily. 03/12/20  Yes [provider]  clopidogrel (PLAVIX) 75 MG tablet Take 1 tablet (75 mg total) by mouth daily. 04/07/15  Yes Fritzi Mandes, MD  docusate sodium (COLACE) 100 MG capsule Take 1 capsule (100 mg total) by mouth 2 (two) times daily. 04/19/18  Yes Wieting, Richard, MD  escitalopram (LEXAPRO) 20 MG tablet Take 1 tablet (20 mg total) by mouth daily. Patient taking differently: Take 20 mg by mouth  2 (two) times daily. 11/26/14  Yes Gouru, Illene Silver, MD  esomeprazole (NEXIUM) 20 MG capsule Take 1 capsule (20 mg total) by mouth AC breakfast. 09/24/15  Yes Fritzi Mandes, MD  famotidine (PEPCID) 20 MG tablet Take 1 tablet by mouth at bedtime. 01/21/20  Yes [provider]  gabapentin (NEURONTIN) 300 MG capsule TAKE ONE CAPSULE BY MOUTH AT BEDTIME FOR NERVE PAIN 03/26/20  Yes [provider]  glipiZIDE (GLUCOTROL) 5 MG tablet Take 5 mg by mouth every morning. 09/22/21  Yes [provider]  hyoscyamine (LEVBID) 0.375 MG 12 hr tablet Take 0.375 mg by mouth every 12 (twelve) hours.   Yes [provider]  insulin degludec (TRESIBA) 100  UNIT/ML SOPN FlexTouch Pen Inject 20 Units into the skin daily. 02/22/18  Yes [provider]  Ipratropium-Albuterol (COMBIVENT RESPIMAT) 20-100 MCG/ACT AERS respimat Inhale 1 puff into the lungs every 6 (six) hours. 12/20/20  Yes Lorella Nimrod, MD  isosorbide mononitrate (IMDUR) 60 MG 24 hr tablet Take 1 tablet (60 mg total) by mouth daily. 04/19/18  Yes Wieting, Richard, MD  losartan (COZAAR) 100 MG tablet Take 100 mg by mouth daily.    Yes [provider]  magnesium oxide (MAG-OX) 400 MG tablet Take 400 mg by mouth daily.    Yes [provider]  nitroGLYCERIN (NITROSTAT) 0.4 MG SL tablet Place 0.4 mg under the tongue every 5 (five) minutes as needed for chest pain.    Yes [provider]  Nutritional Supplements (FEEDING SUPPLEMENT, GLUCERNA 1.2 CAL,) LIQD Take by mouth. 12/05/19  Yes [provider]  Roma Schanz test strip SMARTSIG:1 Each Via Meter Daily 03/03/20  Yes [provider]  primidone (MYSOLINE) 50 MG tablet Take 1 tablet by mouth 2 (two) times daily. Richard/21/16  Yes [provider]  tamsulosin (FLOMAX) 0.4 MG CAPS capsule Take 0.4 mg by mouth daily.    Yes [provider]  traZODone (DESYREL) 100 MG tablet Take 1 tablet (100 mg total) by mouth at bedtime. 04/19/18  Yes Wieting, Richard, MD  vitamin B-12 (CYANOCOBALAMIN) 1000 MCG tablet Take 1,000 mcg by mouth daily.    Yes [provider]  white petrolatum (VASELINE) OINT Apply 1 application topically as needed (Wound care). 04/19/18  Yes Wieting, Richard, MD  propranolol (INDERAL) 40 MG tablet TAKE ONE-HALF TABLET BY MOUTH TWO TIMES A DAY FOR BLOOD PRESSURE 02/25/20   [provider]    Family History Family History  Problem Relation Age of Onset   Heart disease Mother    Heart disease Father    Diabetes Father    Stroke Father    Kidney disease Neg Hx    Prostate cancer Neg Hx     Social History Social History   Tobacco Use   Smoking  status: Every Day    Packs/day: 1    Types: Cigarettes   Smokeless tobacco: Never  Vaping Use   Vaping Use: Never used  Substance Use Topics   Alcohol use: Not Currently    Alcohol/week: 2.0 standard drinks of alcohol    Types: 2 Standard drinks or equivalent per week   Drug use: No     Allergies   Lisinopril   Review of Systems Review of Systems Per HPI  Physical Exam Triage Vital Signs ED Triage Vitals  Enc Vitals Group     BP 05/13/22 1506 117/62     Pulse Rate 05/13/22 1506 92     Resp --      Temp --  Temp src --      SpO2 05/13/22 1506 94 %     Weight --      Height --      Head Circumference --      Peak Flow --      Pain Score 05/13/22 1500 0     Pain Loc --      Pain Edu? --      Excl. in [place]? --    No data found.  Updated Vital Signs BP 117/62 (BP Location: Left Arm)   Pulse 92   SpO2 94%   Visual Acuity Right Eye Distance:   Left Eye Distance:   Bilateral Distance:    Right Eye Near:   Left Eye Near:    Bilateral Near:     Physical Exam Vitals and nursing note reviewed.  Constitutional:      General: He is not in acute distress. HENT:     Head: Normocephalic and atraumatic.  Eyes:     General:        Right eye: No discharge.        Left eye: No discharge.     Conjunctiva/sclera: Conjunctivae normal.  Cardiovascular:     Rate and Rhythm: Normal rate and regular rhythm.  Pulmonary:     Effort: Pulmonary effort is normal. No respiratory distress.  Neurological:     Mental Status: He is alert. He is disoriented.      UC Treatments / Results  Labs (all labs ordered are listed, but only abnormal results are displayed) Labs Reviewed - No data to display  EKG   Radiology No results found.  Procedures Procedures (including critical care time)  Medications Ordered in UC Medications - No data to display  Initial Impression / Assessment and Plan / UC Course  I have reviewed the triage vital signs and the nursing  notes.  Pertinent labs & imaging results that were available during my care of the patient were reviewed by me and considered in my medical decision making (see chart for details).    82 year old Chapman presents with altered mental status/encephalopathy.  He is alert but is not oriented.  He believes that tomorrow is Christmas and the year is 69.  He does not know the name of one of the individuals who came with him (son's girlfriend).  Wife and son's girlfriend do not feel that they can safely transport him to the hospital.  Patient is going via EMS to the ER for further evaluation.  Needs complete workup including labs, urine, chest x-ray, etc.  Needs hospital admission.   Final Clinical Impressions(s) / UC Diagnoses   Final diagnoses:  Encephalopathy   Discharge Instructions   None    ED Prescriptions   None    PDMP not reviewed this encounter.   Coral Spikes, [place] 05/13/22 1542

## 2022-05-13 NOTE — ED Notes (Signed)
Pt attempted to provide urine sample, unable to at this time. Will try again later.

## 2022-05-13 NOTE — Sepsis Progress Note (Signed)
Elink monitoring for sepsis protocol 

## 2022-05-13 NOTE — ED Provider Notes (Signed)
[facility] Provider Note    Event Date/Time   First MD Initiated Contact with Patient 05/13/22 1559     (approximate)  History   Chief Complaint: Altered Mental Status  HPI  Richard Chapman is a 82 y.o. male with a past medical history of diabetes, gastric reflux, hypertension, presents to the emergency department for confusion.  According to report patient is coming from the urgent care for altered mental status.  Patient lives at home with his wife and is not normally confused at baseline.  Has been confused over the past couple days.  Per report wife had noticed a cough for the past 3 to 4 weeks and has been feeling somewhat warm recently possibly with a fever although no recorded temperature and the patient received Tylenol earlier today.  Here the patient is awake alert he is pleasant he does seem confused he was oriented to person and place but not time.  Awaiting family arrival for further details.  Physical Exam   Triage Vital Signs: ED Triage Vitals [05/13/22 1600]  Enc Vitals Group     BP      Pulse      Resp      Temp      Temp src      SpO2      Weight 190 lb (86.2 kg)     Height 5\' 7"  (1.702 m)     Head Circumference      Peak Flow      Pain Score 0     Pain Loc      Pain Edu?      Excl. in [place]?     Most recent vital signs: There were no vitals filed for this visit.  General: Awake, no distress.  CV:  Good peripheral perfusion.  Regular rate and rhythm  Resp:  Normal effort.  Equal breath sounds bilaterally.  Abd:  No distention.  Soft, nontender.  No rebound or guarding.  ED Results / Procedures / Treatments   RADIOLOGY  I have reviewed and interpreted chest x-ray images.  No obvious consolidation seen on my evaluation. Radiology has read the x-ray as bilateral lower lobe atelectasis versus infiltrate. CT scan of the chest confirms left lower lobe pneumonia. CT scan of the head shows no concerning findings.   MEDICATIONS  ORDERED IN ED: Medications - No data to display   IMPRESSION / MDM / [place] / ED COURSE  I reviewed the triage vital signs and the nursing notes.  Patient's presentation is most consistent with acute presentation with potential threat to life or bodily function.  Patient presents to the emergency department for altered mental status.  Overall patient appears well, no distress.  Patient denies any symptoms denies any complaints.  Does appear somewhat confused disoriented to time, awaiting family arrival to confirm baseline although per report baseline is alert and oriented x 4.  We will check labs including blood work, urinalysis obtain a chest x-ray given the report of a cough as well as a COVID test given the report of a cough.  Given the altered mental state will also obtain CT imaging of the head and continue to closely monitor while awaiting results.  Differential is quite broad but would include viral etiology such as COVID or influenza, bacterial infection such as pneumonia or UTI, possible CVA or ICH, metabolic or electrolyte abnormality.  Patient's workup has resulted showing significant leukocytosis of 20,000.  Family is here now who  denies any recent steroid or prednisone use./Flu/RSV is negative.  Troponin slightly elevated at 32, which is largely unchanged from historical values.  Patient's chemistry shows mild renal insufficiency which again is largely unchanged from historical values as well.  Urinalysis shows no signs of infection.  Lactic acid is 1.2 however given the patient's altered mental state, white blood cell count of 20,000 and CT scan showing a left lower lobe pneumonia we have started broad-spectrum antibiotics, send blood cultures.  Patient will be admitted to the hospital service for further workup and treatment.  CRITICAL CARE Performed by: Harvest Dark   Total critical care time: 30 minutes  Critical care time was exclusive of separately billable  procedures and treating other patients.  Critical care was necessary to treat or prevent imminent or life-threatening deterioration.  Critical care was time spent personally by me on the following activities: development of treatment plan with patient and/or surrogate as well as nursing, discussions with consultants, evaluation of patient's response to treatment, examination of patient, obtaining history from patient or surrogate, ordering and performing treatments and interventions, ordering and review of laboratory studies, ordering and review of radiographic studies, pulse oximetry and re-evaluation of patient's condition.   FINAL CLINICAL IMPRESSION(S) / ED DIAGNOSES   Altered mental status Pneumonia    Note:  This document was prepared using Dragon voice recognition software and may include unintentional dictation errors.   Harvest Dark, MD 05/13/22 2205

## 2022-05-13 NOTE — ED Notes (Signed)
Patient is being discharged from the Urgent Care and sent to the Emergency Department via EMS . Per Dr. Lacinda Axon, patient is in need of higher level of care due to Altered Mental Status and Confusion. Patient is aware and verbalizes understanding of plan of care.  Vitals:   05/13/22 1506  BP: 117/62  Pulse: 92  SpO2: 94%

## 2022-05-13 NOTE — ED Notes (Signed)
Request made for transport to the floor ?

## 2022-05-13 NOTE — ED Triage Notes (Signed)
Pt brought to ED via [facility] from [facility] for altered mental status. Pt A&Ox2. Per EMS vitals WNL. Strong smell of urine noted from pt.

## 2022-05-13 NOTE — ED Notes (Signed)
EMS has been called for patient transport. 

## 2022-05-14 DIAGNOSIS — F17200 Nicotine dependence, unspecified, uncomplicated: Secondary | ICD-10-CM | POA: Diagnosis present

## 2022-05-14 DIAGNOSIS — H9190 Unspecified hearing loss, unspecified ear: Secondary | ICD-10-CM | POA: Diagnosis present

## 2022-05-14 DIAGNOSIS — R410 Disorientation, unspecified: Secondary | ICD-10-CM | POA: Diagnosis not present

## 2022-05-14 DIAGNOSIS — A419 Sepsis, unspecified organism: Secondary | ICD-10-CM

## 2022-05-14 DIAGNOSIS — Z7984 Long term (current) use of oral hypoglycemic drugs: Secondary | ICD-10-CM | POA: Diagnosis not present

## 2022-05-14 DIAGNOSIS — I129 Hypertensive chronic kidney disease with stage 1 through stage 4 chronic kidney disease, or unspecified chronic kidney disease: Secondary | ICD-10-CM | POA: Diagnosis present

## 2022-05-14 DIAGNOSIS — F419 Anxiety disorder, unspecified: Secondary | ICD-10-CM | POA: Diagnosis present

## 2022-05-14 DIAGNOSIS — Z87442 Personal history of urinary calculi: Secondary | ICD-10-CM | POA: Diagnosis not present

## 2022-05-14 DIAGNOSIS — K219 Gastro-esophageal reflux disease without esophagitis: Secondary | ICD-10-CM | POA: Diagnosis present

## 2022-05-14 DIAGNOSIS — Z85828 Personal history of other malignant neoplasm of skin: Secondary | ICD-10-CM | POA: Diagnosis not present

## 2022-05-14 DIAGNOSIS — Z888 Allergy status to other drugs, medicaments and biological substances status: Secondary | ICD-10-CM | POA: Diagnosis not present

## 2022-05-14 DIAGNOSIS — Z8679 Personal history of other diseases of the circulatory system: Secondary | ICD-10-CM | POA: Diagnosis not present

## 2022-05-14 DIAGNOSIS — F32A Depression, unspecified: Secondary | ICD-10-CM | POA: Diagnosis present

## 2022-05-14 DIAGNOSIS — Z9049 Acquired absence of other specified parts of digestive tract: Secondary | ICD-10-CM | POA: Diagnosis not present

## 2022-05-14 DIAGNOSIS — Z794 Long term (current) use of insulin: Secondary | ICD-10-CM | POA: Diagnosis not present

## 2022-05-14 DIAGNOSIS — Z8546 Personal history of malignant neoplasm of prostate: Secondary | ICD-10-CM | POA: Diagnosis not present

## 2022-05-14 DIAGNOSIS — R82998 Other abnormal findings in urine: Secondary | ICD-10-CM | POA: Diagnosis present

## 2022-05-14 DIAGNOSIS — E1122 Type 2 diabetes mellitus with diabetic chronic kidney disease: Secondary | ICD-10-CM | POA: Diagnosis present

## 2022-05-14 DIAGNOSIS — G4730 Sleep apnea, unspecified: Secondary | ICD-10-CM | POA: Diagnosis present

## 2022-05-14 DIAGNOSIS — Z1152 Encounter for screening for COVID-19: Secondary | ICD-10-CM | POA: Diagnosis not present

## 2022-05-14 DIAGNOSIS — J189 Pneumonia, unspecified organism: Secondary | ICD-10-CM | POA: Diagnosis present

## 2022-05-14 DIAGNOSIS — Z79899 Other long term (current) drug therapy: Secondary | ICD-10-CM | POA: Diagnosis not present

## 2022-05-14 DIAGNOSIS — N1832 Chronic kidney disease, stage 3b: Secondary | ICD-10-CM | POA: Diagnosis present

## 2022-05-14 DIAGNOSIS — Z7902 Long term (current) use of antithrombotics/antiplatelets: Secondary | ICD-10-CM | POA: Diagnosis not present

## 2022-05-14 DIAGNOSIS — H7492 Unspecified disorder of left middle ear and mastoid: Secondary | ICD-10-CM | POA: Diagnosis present

## 2022-05-14 DIAGNOSIS — R4182 Altered mental status, unspecified: Secondary | ICD-10-CM | POA: Diagnosis present

## 2022-05-14 DIAGNOSIS — I2510 Atherosclerotic heart disease of native coronary artery without angina pectoris: Secondary | ICD-10-CM | POA: Diagnosis present

## 2022-05-14 LAB — CBC WITH DIFFERENTIAL/PLATELET
Abs Immature Granulocytes: 0.05 10*3/uL (ref 0.00–0.07)
Basophils Absolute: 0 10*3/uL (ref 0.0–0.1)
Basophils Relative: 0 %
Eosinophils Absolute: 0 10*3/uL (ref 0.0–0.5)
Eosinophils Relative: 0 %
HCT: 38.6 % — ABNORMAL LOW (ref 39.0–52.0)
Hemoglobin: 13.5 g/dL (ref 13.0–17.0)
Immature Granulocytes: 0 %
Lymphocytes Relative: 19 %
Lymphs Abs: 3.3 10*3/uL (ref 0.7–4.0)
MCH: 30.5 pg (ref 26.0–34.0)
MCHC: 35 g/dL (ref 30.0–36.0)
MCV: 87.1 fL (ref 80.0–100.0)
Monocytes Absolute: 1.3 10*3/uL — ABNORMAL HIGH (ref 0.1–1.0)
Monocytes Relative: 8 %
Neutro Abs: 12.6 10*3/uL — ABNORMAL HIGH (ref 1.7–7.7)
Neutrophils Relative %: 73 %
Platelets: 153 10*3/uL (ref 150–400)
RBC: 4.43 MIL/uL (ref 4.22–5.81)
RDW: 13.8 % (ref 11.5–15.5)
WBC: 17.3 10*3/uL — ABNORMAL HIGH (ref 4.0–10.5)
nRBC: 0 % (ref 0.0–0.2)

## 2022-05-14 LAB — BASIC METABOLIC PANEL
Anion gap: 6 (ref 5–15)
BUN: 31 mg/dL — ABNORMAL HIGH (ref 8–23)
CO2: 22 mmol/L (ref 22–32)
Calcium: 8.1 mg/dL — ABNORMAL LOW (ref 8.9–10.3)
Chloride: 108 mmol/L (ref 98–111)
Creatinine, Ser: 1.6 mg/dL — ABNORMAL HIGH (ref 0.61–1.24)
GFR, Estimated: 42 mL/min — ABNORMAL LOW (ref >60)
Glucose, Bld: 100 mg/dL — ABNORMAL HIGH (ref 70–99)
Potassium: 3.7 mmol/L (ref 3.5–5.1)
Sodium: 136 mmol/L (ref 135–145)

## 2022-05-14 LAB — GLUCOSE, CAPILLARY
Glucose-Capillary: 132 mg/dL — ABNORMAL HIGH (ref 70–99)
Glucose-Capillary: 180 mg/dL — ABNORMAL HIGH (ref 70–99)
Glucose-Capillary: 198 mg/dL — ABNORMAL HIGH (ref 70–99)
Glucose-Capillary: 213 mg/dL — ABNORMAL HIGH (ref 70–99)
Glucose-Capillary: 229 mg/dL — ABNORMAL HIGH (ref 70–99)
Glucose-Capillary: 60 mg/dL — ABNORMAL LOW (ref 70–99)
Glucose-Capillary: 95 mg/dL (ref 70–99)
Glucose-Capillary: 96 mg/dL (ref 70–99)

## 2022-05-14 MED ORDER — CLOPIDOGREL BISULFATE 75 MG PO TABS
75.0000 mg | ORAL_TABLET | Freq: Every day | ORAL | Status: DC
  Administered 2022-05-14 – 2022-05-17 (×4): 75 mg via ORAL
  Filled 2022-05-14 (×5): qty 1

## 2022-05-14 MED ORDER — ISOSORBIDE MONONITRATE ER 30 MG PO TB24
60.0000 mg | ORAL_TABLET | Freq: Two times a day (BID) | ORAL | Status: DC
  Administered 2022-05-14 – 2022-05-17 (×7): 60 mg via ORAL
  Filled 2022-05-14 (×7): qty 2

## 2022-05-14 NOTE — Plan of Care (Signed)
  Problem: Activity: Goal: Ability to tolerate increased activity will improve Outcome: Progressing   Problem: Clinical Measurements: Goal: Ability to maintain a body temperature in the normal range will improve Outcome: Progressing   Problem: Respiratory: Goal: Ability to maintain adequate ventilation will improve Outcome: Progressing Goal: Ability to maintain a clear airway will improve Outcome: Progressing   Problem: Education: Goal: Ability to describe self-care measures that may prevent or decrease complications (Diabetes Survival Skills Education) will improve Outcome: Progressing   Problem: Fluid Volume: Goal: Ability to maintain a balanced intake and output will improve Outcome: Progressing   Problem: Metabolic: Goal: Ability to maintain appropriate glucose levels will improve Outcome: Progressing   Problem: Skin Integrity: Goal: Risk for impaired skin integrity will decrease Outcome: Progressing   Problem: Clinical Measurements: Goal: Respiratory complications will improve Outcome: Progressing Goal: Cardiovascular complication will be avoided Outcome: Progressing   Problem: Activity: Goal: Risk for activity intolerance will decrease Outcome: Progressing   Problem: Coping: Goal: Level of anxiety will decrease Outcome: Progressing   Problem: Pain Managment: Goal: General experience of comfort will improve Outcome: Progressing   Problem: Safety: Goal: Ability to remain free from injury will improve Outcome: Progressing

## 2022-05-14 NOTE — Progress Notes (Signed)
S:  Wife inquiring why he has not received home prescribed trazodone.  Educated on the potential for drowsiness and increased confusion per reason for admission.  Verbalized understanding but reports increased restless at night.  B:  67 M BIB family to ED for confusion.  Patient on arrival did not understand why he was here in the hospital.  Patient sent to [place] from urgent care for his mental status.  Patient is cooperative and nonfocal.  Speech is clear and verbalizes understanding of plan.  He does have a past medical history of CKD hypertension and prostate cancer.  Family was worried about fever and gave him Tylenol patient has been coughing for about 4 weeks but not confused until recently.  Patient noted to have difficulty with ambulation and is high fall risk.  Malodorous urine per EMS note.  Patient is a current smoker.  Confusion is acute and new source of unclear.    3/31 : Patient symptomatically improved, wife at bedside states patient is at baseline.  Will continue current management.  ENT evaluation possible tomorrow for left ear effusion.  Patient with no tenderness over the left ear.  A:  Alert and oriented to self and place.  Repetitive speech.  Slightly restless, but cooperative and redirectable.  VS 187/76, MAP 108, HR 68, RR 20, 97% room air 98.7 oral temp.  Denies pain.  Wife at bedside.  HS Isosorbide 60 mg given coinciding current BP.    R:  ?

## 2022-05-14 NOTE — Evaluation (Signed)
Occupational Therapy Evaluation Patient Details Name: Richard Chapman MRN: [id number] DOB: 1940-04-20 Today's Date: 05/14/2022   History of Present Illness Richard Chapman is an 50yoM who comes to [facility] due to confusion. Workup showed PNA. PMH: HTN, HLd, DM, stroke, GERD, depression/GAD, OSA, not on CPAP, HOH, SDH, PrCA, BPH, CKD3b, CAD.   Clinical Impression   Patient agreeable to OT evaluation. Pt presenting with decreased independence in self care, balance, functional mobility/transfers, endurance, and safety awareness. Spouse present and confirmed PLOF/home set up. PTA pt received assistance with ADLs/IADLs as needed and completed functional mobility using a RW. A&Ox3 (disoriented to time). Pt currently functioning at [place] guard for bed mobility, simulated toilet transfer, and functional mobility using a RW. VC for safety awareness required t/o session. Pt will benefit from skilled acute OT services to address deficits noted below. OT recommends ongoing therapy upon discharge to maximize safety and independence with ADLs, decrease fall risk, decrease caregiver burden, and promote return to PLOF.     Recommendations for follow up therapy are one component of a multi-disciplinary discharge planning process, led by the attending physician.  Recommendations may be updated based on patient status, additional functional criteria and insurance authorization.   Assistance Recommended at Discharge Frequent or constant Supervision/Assistance  Patient can return home with the following A little help with walking and/or transfers;A little help with bathing/dressing/bathroom;Assistance with cooking/housework;Assist for transportation;Help with stairs or ramp for entrance;Direct supervision/assist for medications management;Direct supervision/assist for financial management    Functional Status Assessment  Patient has had a recent decline in their functional status and demonstrates the ability to make significant  improvements in function in a reasonable and predictable amount of time.  Equipment Recommendations  None recommended by OT    Recommendations for Other Services       Precautions / Restrictions Precautions Precautions: Fall Restrictions Weight Bearing Restrictions: No      Mobility Bed Mobility Overal bed mobility: Needs Assistance Bed Mobility: Supine to Sit     Supine to sit: Min guard          Transfers Overall transfer level: Needs assistance Equipment used: Rolling walker (2 wheels) Transfers: Sit to/from Stand Sit to Stand: Min guard           General transfer comment: VC for hand placement, attempting to pull up on RW      Balance Overall balance assessment: Needs assistance Sitting-balance support: Feet supported Sitting balance-Leahy Scale: Good     Standing balance support: Bilateral upper extremity supported, During functional activity Standing balance-Leahy Scale: Fair         ADL either performed or assessed with clinical judgement   ADL Overall ADL's : Needs assistance/impaired     Grooming: Min guard;Standing Grooming Details (indicate cue type and reason): anticipate                 Toilet Transfer: Rolling walker (2 wheels);Min guard;Ambulation Toilet Transfer Details (indicate cue type and reason): simulated         Functional mobility during ADLs: Min guard;Rolling walker (2 wheels) (~53ft)       Vision Baseline Vision/History: 1 Wears glasses Patient Visual Report: No change from baseline       Perception     Praxis      Pertinent Vitals/Pain Pain Assessment Pain Assessment: No/denies pain     Hand Dominance Right   Extremity/Trunk Assessment Upper Extremity Assessment Upper Extremity Assessment: Generalized weakness   Lower Extremity Assessment Lower Extremity Assessment: Generalized weakness  Cervical / Trunk Assessment Cervical / Trunk Assessment: Normal   Communication  Communication Communication: HOH   Cognition Arousal/Alertness: Awake/alert Behavior During Therapy: WFL for tasks assessed/performed Overall Cognitive Status: Within Functional Limits for tasks assessed     General Comments: Normally A&O at baseline, wife endorses confusion recently at home. Pleasant and cooperative, follow commands well (repetition due to [facility]). A&Ox3 (disoriented to date).     General Comments       Exercises Other Exercises Other Exercises: OT provided education re: role of OT, OT POC, post acute recs, sitting up for all meals, EOB/OOB mobility with assistance, home/fall safety.     Shoulder Instructions      Home Living Family/patient expects to be discharged to:: Private residence Living Arrangements: Spouse/significant other;Children;Other relatives (son and grandchildren) Available Help at Discharge: Family;Available 24 hours/day Type of Home: House Home Access: Stairs to enter [employer] of Steps: 1 Entrance Stairs-Rails: Right Home Layout: One level     Bathroom Shower/Tub: Teacher, early years/pre: Handicapped height     Home Equipment: Conservation officer, nature (2 wheels);Cane - single point;Hand held shower head;Tub bench;BSC/3in1          Prior Functioning/Environment Prior Level of Function : Needs assist             Mobility Comments: occasionally needs help to stand per wife, RW at all times, no falls, has life alert ADLs Comments: Wife assists with ADLs (bathing, dressing, toileting) and IADLs (driving, meals, med mgmt), IND for self-feeding. Urinary incontinence at baseline - has urinal, wears depends.        OT Problem List: Decreased strength;Decreased activity tolerance;Impaired balance (sitting and/or standing);Decreased knowledge of use of DME or AE;Decreased safety awareness      OT Treatment/Interventions: Self-care/ADL training;Therapeutic exercise;Neuromuscular education;Energy conservation;DME and/or AE  instruction;Manual therapy;Modalities;Balance training;Patient/family education;Visual/perceptual remediation/compensation;Cognitive remediation/compensation;Therapeutic activities;Splinting    OT Goals(Current goals can be found in the care plan section) Acute Rehab OT Goals Patient Stated Goal: return home OT Goal Formulation: With patient/family Time For Goal Achievement: 05/28/22 Potential to Achieve Goals: Good   OT Frequency: Min 2X/week    Co-evaluation              AM-PAC OT "6 Clicks" Daily Activity     Outcome Measure Help from another person eating meals?: None Help from another person taking care of personal grooming?: A Little Help from another person toileting, which includes using toliet, bedpan, or urinal?: A Little Help from another person bathing (including washing, rinsing, drying)?: A Little Help from another person to put on and taking off regular upper body clothing?: None Help from another person to put on and taking off regular lower body clothing?: A Little 6 Click Score: 20   End of Session Equipment Utilized During Treatment: Gait belt;Rolling walker (2 wheels) Nurse Communication: Mobility status  Activity Tolerance: Patient tolerated treatment well Patient left: in chair;with call bell/phone within reach;with chair alarm set;with family/visitor present  OT Visit Diagnosis: Other abnormalities of gait and mobility (R26.89);Muscle weakness (generalized) (M62.81)                Time: [id number] OT Time Calculation (min): 22 min Charges:  OT General Charges $OT Visit: 1 Visit OT Evaluation $OT Eval Low Complexity: 1 Low  [facility] MS, OTR/L ascom [phone]  05/14/22, 1:19 PM

## 2022-05-14 NOTE — Evaluation (Signed)
Physical Therapy Evaluation Patient Details Name: Richard Chapman MRN: [id number] DOB: June 12, 1940 Today's Date: 05/14/2022  History of Present Illness  Richard Chapman is an 72yoM who comes to [facility] due to confusion. Workup showed PNA. PMH: HTN, HLd, DM, stroke, GERD, depression/GAD, OSA, not on CPAP, HOH, SDH, PrCA, BPH, CKD3b, CAD.   Clinical Impression  Patient alert, agreeable to PT, HOH and family at bedside verified PLOF. Pt utilizes a RW and often moves impulsively.   CGA for pt to mobilize to EOB, good balance noted. (Handheld assist provided at pt request though not necessary). He was able to sit for several minutes for PLOF questions as well as RN medication administration. Sit <> stand with RW and CGA, pt needed cues for hand placement to maximize independence. He ambulated ~75ft with RW and CGA (limited due to incontinence at baseline), no LOB and pt did move rather quickly.  Overall the patient demonstrated deficits (see "PT Problem List") that impede the patient's functional abilities, safety, and mobility and would benefit from skilled PT intervention. Recommendation at this time is continued PT services to maximize independence, function and safety.      Recommendations for follow up therapy are one component of a multi-disciplinary discharge planning process, led by the attending physician.  Recommendations may be updated based on patient status, additional functional criteria and insurance authorization.  Follow Up Recommendations       Assistance Recommended at Discharge Frequent or constant Supervision/Assistance  Patient can return home with the following  Help with stairs or ramp for entrance;Direct supervision/assist for medications management;Assistance with cooking/housework;A little help with bathing/dressing/bathroom    Equipment Recommendations None recommended by PT  Recommendations for Other Services       Functional Status Assessment Patient has had a recent  decline in their functional status and demonstrates the ability to make significant improvements in function in a reasonable and predictable amount of time.     Precautions / Restrictions Precautions Precautions: Fall Restrictions Weight Bearing Restrictions: No      Mobility  Bed Mobility Overal bed mobility: Needs Assistance Bed Mobility: Supine to Sit     Supine to sit: Min guard     General bed mobility comments: handheld assist provided but not required    Transfers Overall transfer level: Needs assistance Equipment used: Rolling walker (2 wheels) Transfers: Sit to/from Stand Sit to Stand: Min guard           General transfer comment: cued for hand placement to maximize safety    Ambulation/Gait   Gait Distance (Feet): 55 Feet Assistive device: Rolling walker (2 wheels)         General Gait Details: pt ambulates quickly, no LOB  Stairs            Wheelchair Mobility    Modified Rankin (Stroke Patients Only)       Balance Overall balance assessment: Needs assistance Sitting-balance support: Feet supported Sitting balance-Leahy Scale: Good       Standing balance-Leahy Scale: Fair                               Pertinent Vitals/Pain Pain Assessment Pain Assessment: No/denies pain    Home Living Family/patient expects to be discharged to:: Private residence Living Arrangements: Spouse/significant other;Children;Other relatives Available Help at Discharge: Family;Available 24 hours/day Type of Home: House Home Access: Stairs to enter Entrance Stairs-Rails: Right Entrance Stairs-Number of Steps: 1   Home Layout:  One level Home Equipment: Conservation officer, nature (2 wheels);Cane - single point      Prior Function Prior Level of Function : Needs assist             Mobility Comments: occasionally needs help to stand per wife, RW at all times       Hand Dominance        Extremity/Trunk Assessment   Upper Extremity  Assessment Upper Extremity Assessment: Generalized weakness    Lower Extremity Assessment Lower Extremity Assessment: Generalized weakness    Cervical / Trunk Assessment Cervical / Trunk Assessment: Normal  Communication   Communication: HOH  Cognition Arousal/Alertness: Awake/alert Behavior During Therapy: WFL for tasks assessed/performed Overall Cognitive Status: Within Functional Limits for tasks assessed                                          General Comments      Exercises     Assessment/Plan    PT Assessment Patient needs continued PT services  PT Problem List Decreased strength;Decreased mobility;Decreased range of motion;Decreased activity tolerance;Decreased balance;Decreased knowledge of use of DME;Decreased safety awareness       PT Treatment Interventions DME instruction;Therapeutic activities;Gait training;Therapeutic exercise;Patient/family education;Stair training;Balance training;Functional mobility training;Neuromuscular re-education    PT Goals (Current goals can be found in the Care Plan section)  Acute Rehab PT Goals Patient Stated Goal: to go home PT Goal Formulation: With patient Time For Goal Achievement: 05/28/22 Potential to Achieve Goals: Good    Frequency Min 2X/week     Co-evaluation               AM-PAC PT "6 Clicks" Mobility  Outcome Measure Help needed turning from your back to your side while in a flat bed without using bedrails?: None Help needed moving from lying on your back to sitting on the side of a flat bed without using bedrails?: None Help needed moving to and from a bed to a chair (including a wheelchair)?: None Help needed standing up from a chair using your arms (e.g., wheelchair or bedside chair)?: A Little Help needed to walk in hospital room?: A Little Help needed climbing 3-5 steps with a railing? : A Little 6 Click Score: 21    End of Session Equipment Utilized During Treatment: Gait  belt Activity Tolerance: Patient tolerated treatment well Patient left: with chair alarm set;in chair;with call bell/phone within reach;with family/visitor present Nurse Communication: Mobility status PT Visit Diagnosis: Other abnormalities of gait and mobility (R26.89);History of falling (Z91.81);Muscle weakness (generalized) (M62.81)    Time: [id number] PT Time Calculation (min) (ACUTE ONLY): 22 min   Charges:   PT Evaluation $PT Eval Low Complexity: 1 Low          Lieutenant Diego PT, DPT 12:40 PM,05/14/22

## 2022-05-14 NOTE — Progress Notes (Signed)
Progress Note   Patient: Richard Chapman [id number] DOB: June 01, 1940 DOA: 05/13/2022     0 DOS: the patient was seen and examined on 05/14/2022   Brief hospital course:  82 y.o. male brought by family 32 emergency room for confusion.  Patient on arrival did not understand why he was here in the hospital.  Patient sent to [place] from urgent care for his mental status.  Patient is cooperative and nonfocal.  Speech is clear and verbalizes understanding of plan.  He does have a past medical history of CKD hypertension and prostate cancer.  Family was worried about fever and gave him Tylenol patient has been coughing for about 4 weeks but not confused until recently.  Patient noted to have difficulty with ambulation and is high fall risk.  Malodorous urine per EMS note.  Patient is a current smoker.  Confusion is acute and new source of unclear.   3/31 : Patient symptomatically improved, wife at bedside states patient is at baseline.  Will continue current management.  ENT evaluation possible tomorrow for left ear effusion.  Patient with no tenderness over the left ear.   Assessment and Plan:  Confusion and disorientation Attribute patient's infection. Treat the underlying cause.  Monitor with neurochecks. Aspiration and fall precaution.  Sepsis  Pt meets sepsis criteria. We will continue patient on rocephin and azithromycin.   Mastoid disorder, left Ct imaging shows left mastoid effusion.  Patient asymptomatic with no tenderness over the left ear however has hearing impairment. ENT consult tomorrow.  Tobacco abuse Nicotine patch .  HTN (hypertension) Vitals:   05/13/22 1600 05/13/22 1636 05/13/22 1730 05/13/22 1800  BP: (!) 159/65 (!) 151/77 134/86 (!) 154/67   05/13/22 1830  BP: (!) 148/74  Cont pt's  Imdur.  Hold losartan and amlodipine.  Hold propranolol.   Type II diabetes mellitus with renal manifestations (HCC) Accuchecks AC.HS and SSI Glycemic protocol.  Hold glipizide /  Tyler Aas.   CKD (chronic kidney disease), stage IIIb Lab Results  Component Value Date   CREATININE 1.72 (H) 05/13/2022   CREATININE 1.60 (H) 12/20/2020   CREATININE 1.79 (H) 12/18/2020  Follow and avoid contrast.   CAP (community acquired pneumonia) Cont CAP iv abx regimen.  Supplemental oxygen if needed.  Pt found to have LLL PNA on imaging,.  Gastro-esophageal reflux disease without esophagitis Continue pepcid.      Subjective: Patient seen and examined this morning.  Vital labs and imaging reviewed.  Patient back to baseline.  Wife at bedside updated about status.  Physical Exam: Vitals:   05/14/22 0116 05/14/22 0300 05/14/22 0455 05/14/22 0901  BP:   (!) 149/59 (!) 145/55  Pulse:   63 60  Resp: 17  20 18   Temp: 100.3 F (37.9 C) 99.4 F (37.4 C) 98.5 F (36.9 C) 98.9 F (37.2 C)  TempSrc:      SpO2:   97% 96%  Weight:      Height:       Physical Exam Constitutional:      Appearance: Normal appearance.  HENT:     Head: Normocephalic and atraumatic.     Comments: Hearing impaired    Mouth/Throat:     Mouth: Mucous membranes are dry.  Eyes:     Extraocular Movements: Extraocular movements intact.     Pupils: Pupils are equal, round, and reactive to light.  Cardiovascular:     Rate and Rhythm: Normal rate and regular rhythm.     Pulses: Normal pulses.  Pulmonary:  Effort: Pulmonary effort is normal.  Abdominal:     General: Abdomen is flat.     Palpations: Abdomen is soft.  Musculoskeletal:        General: Normal range of motion.     Cervical back: Normal range of motion.  Skin:    General: Skin is warm.  Neurological:     General: No focal deficit present.     Mental Status: He is alert.  Psychiatric:        Mood and Affect: Mood normal.        Behavior: Behavior normal.     Data Reviewed:    Family Communication: Wife updated about status.  Disposition: Status is: Observation The patient remains OBS appropriate and will d/c before 2  midnights.  Planned Discharge Destination: Home    Time spent: 34  minutes  Author: Oran Rein, MD 05/14/2022 12:25 PM  For on call review [email or web address].

## 2022-05-14 NOTE — Assessment & Plan Note (Signed)
Ct imaging shows left mastoid effusion. ENT consult per AM team.

## 2022-05-14 NOTE — Progress Notes (Signed)
   05/14/22 1100  Spiritual Encounters  Type of Visit Initial  Care provided to: Pt and family  Referral source Patient request  Reason for visit Advance directives  OnCall Visit Yes   Chaplain responded to request to help with Advance Directives. Apparently these had already been completed however family desired routine spiritual support and prayer.

## 2022-05-14 NOTE — Assessment & Plan Note (Signed)
Vitals:   05/14/22 0011 05/14/22 0116 05/14/22 0300 05/14/22 0455  BP: (!) 149/77   (!) 149/59  Pulse: 63   63  Resp: 18 17  20   Temp: (!) 101.3 F (38.5 C) 100.3 F (37.9 C) 99.4 F (37.4 C) 98.5 F (36.9 C)  TempSrc:      SpO2: 97%   97%  Weight:      Height:      Pt meets sepsis criteria. We will continue patient on rocephin and azithromycin.

## 2022-05-15 DIAGNOSIS — R410 Disorientation, unspecified: Secondary | ICD-10-CM | POA: Diagnosis not present

## 2022-05-15 LAB — CBC
HCT: 39.7 % (ref 39.0–52.0)
Hemoglobin: 14 g/dL (ref 13.0–17.0)
MCH: 30.6 pg (ref 26.0–34.0)
MCHC: 35.3 g/dL (ref 30.0–36.0)
MCV: 86.7 fL (ref 80.0–100.0)
Platelets: 160 10*3/uL (ref 150–400)
RBC: 4.58 MIL/uL (ref 4.22–5.81)
RDW: 13.5 % (ref 11.5–15.5)
WBC: 11.8 10*3/uL — ABNORMAL HIGH (ref 4.0–10.5)
nRBC: 0 % (ref 0.0–0.2)

## 2022-05-15 LAB — GLUCOSE, CAPILLARY
Glucose-Capillary: 147 mg/dL — ABNORMAL HIGH (ref 70–99)
Glucose-Capillary: 222 mg/dL — ABNORMAL HIGH (ref 70–99)
Glucose-Capillary: 240 mg/dL — ABNORMAL HIGH (ref 70–99)
Glucose-Capillary: 315 mg/dL — ABNORMAL HIGH (ref 70–99)

## 2022-05-15 LAB — HEMOGLOBIN A1C
Hgb A1c MFr Bld: 8.5 % — ABNORMAL HIGH (ref 4.8–5.6)
Mean Plasma Glucose: 197 mg/dL

## 2022-05-15 NOTE — Progress Notes (Signed)
Occupational Therapy Treatment Patient Details Name: Richard Chapman MRN: [id number] DOB: 10/27/1940 Today's Date: 05/15/2022   History of present illness Maverik Bueti is an 35yoM who comes to [facility] due to confusion. Workup showed PNA. PMH: HTN, HLd, DM, stroke, GERD, depression/GAD, OSA, not on CPAP, HOH, SDH, PrCA, BPH, CKD3b, CAD.   OT comments  Mr Beldin was seen for OT treatment on this date. Upon arrival to room pt reclined in bed, agreeable to tx. Pt requires SBA + RW for toilet t/f, cues for safety 2/2 impulsivity. MAX A pericare standing. MOD I don/doff B socks seated on toilet. Pt tolerated ~80 ft functional mobility, limited by episodes of bowel incontinence. Pt making good progress toward goals, will continue to follow POC. Discharge recommendation remains appropriate.     Recommendations for follow up therapy are one component of a multi-disciplinary discharge planning process, led by the attending physician.  Recommendations may be updated based on patient status, additional functional criteria and insurance authorization.    Assistance Recommended at Discharge Frequent or constant Supervision/Assistance  Patient can return home with the following  A little help with walking and/or transfers;A little help with bathing/dressing/bathroom;Assistance with cooking/housework;Assist for transportation;Help with stairs or ramp for entrance;Direct supervision/assist for medications management;Direct supervision/assist for financial management   Equipment Recommendations  None recommended by OT    Recommendations for Other Services      Precautions / Restrictions Precautions Precautions: Fall Restrictions Weight Bearing Restrictions: No       Mobility Bed Mobility Overal bed mobility: Needs Assistance Bed Mobility: Sit to Supine, Supine to Sit     Supine to sit: Min guard Sit to supine: Min guard        Transfers Overall transfer level: Needs assistance Equipment used:  Rolling walker (2 wheels) Transfers: Sit to/from Stand Sit to Stand: Min guard                 Balance Overall balance assessment: Needs assistance Sitting-balance support: Feet supported Sitting balance-Leahy Scale: Good     Standing balance support: Bilateral upper extremity supported, During functional activity Standing balance-Leahy Scale: Good                             ADL either performed or assessed with clinical judgement   ADL Overall ADL's : Needs assistance/impaired                                       General ADL Comments: SBA + RW for toilet t/f, cues for safety 2/2 impulsivity. MAX A pericare standing. MOD I don/doff B socks seated on toilet      Cognition Arousal/Alertness: Awake/alert Behavior During Therapy: WFL for tasks assessed/performed Overall Cognitive Status: Impaired/Different from baseline                                 General Comments: impulsive, does well with repeated cues                   Pertinent Vitals/ Pain       Pain Assessment Pain Assessment: No/denies pain   Frequency  Min 2X/week        Progress Toward Goals  OT Goals(current goals can now be found in the care plan section)  Progress towards OT goals:  Progressing toward goals  Acute Rehab OT Goals Patient Stated Goal: to go home OT Goal Formulation: With patient/family Time For Goal Achievement: 05/28/22 Potential to Achieve Goals: Good ADL Goals Pt Will Perform Lower Body Dressing: with min assist;sit to/from stand;with caregiver independent in assisting Pt Will Transfer to Toilet: with supervision;ambulating Pt Will Perform Toileting - Clothing Manipulation and hygiene: with min assist;sit to/from stand;with caregiver independent in assisting  Plan Discharge plan remains appropriate;Frequency remains appropriate    Co-evaluation                 AM-PAC OT "6 Clicks" Daily Activity     Outcome  Measure   Help from another person eating meals?: None Help from another person taking care of personal grooming?: A Little Help from another person toileting, which includes using toliet, bedpan, or urinal?: A Little Help from another person bathing (including washing, rinsing, drying)?: A Little Help from another person to put on and taking off regular upper body clothing?: None Help from another person to put on and taking off regular lower body clothing?: A Little 6 Click Score: 20    End of Session    OT Visit Diagnosis: Other abnormalities of gait and mobility (R26.89);Muscle weakness (generalized) (M62.81)   Activity Tolerance Patient tolerated treatment well   Patient Left in bed;with call bell/phone within reach;with bed alarm set;with family/visitor present   Nurse Communication          Time: [id number] OT Time Calculation (min): 16 min  Charges: OT General Charges $OT Visit: 1 Visit OT Treatments $Self Care/Home Management : 8-22 mins   Dessie Coma, M.S. OTR/L  05/15/22, 2:40 PM  ascom [phone]

## 2022-05-15 NOTE — TOC Progression Note (Signed)
Transition of Care [facility]) - Progression Note    Patient Details  Name: Richard Chapman MRN: [id number] Date of Birth: 06-16-1940  Transition of Care [facility] CM/SW Contact  Gerilyn Pilgrim, LCSW Phone Number: 05/15/2022, 12:40 PM  Clinical Narrative:   CSW spoke with wife regarding HH. Wife reports no preference on agency. Wife states she has a shower chair and a RW that she obtained from [place]. Wife states she is interested in a Rollator. CSW to order.          Expected Discharge Plan and Services                                               Social Determinants of Health (SDOH) Interventions SDOH Screenings   Food Insecurity: No Food Insecurity (05/13/2022)  Housing: Low Risk  (05/13/2022)  Transportation Needs: No Transportation Needs (05/13/2022)  Utilities: Not At Risk (05/13/2022)  Tobacco Use: High Risk (05/13/2022)    Readmission Risk Interventions     No data to display

## 2022-05-15 NOTE — Progress Notes (Signed)
. Progress Note   Patient: Richard Chapman [id number] DOB: 26-Feb-1940 DOA: 05/13/2022     1 DOS: the patient was seen and examined on 05/15/2022   Brief hospital course:  82 y.o. male brought by family 29 emergency room for confusion.  Patient on arrival did not understand why he was here in the hospital.  Patient sent to [place] from urgent care for his mental status.  Patient is cooperative and nonfocal.  Speech is clear and verbalizes understanding of plan.  He does have a past medical history of CKD hypertension and prostate cancer.  Family was worried about fever and gave him Tylenol patient has been coughing for about 4 weeks but not confused until recently.  Patient noted to have difficulty with ambulation and is high fall risk.  Malodorous urine per EMS note.  Patient is a current smoker.  Confusion is acute and new source of unclear.   3/31 : Patient symptomatically improved, wife at bedside states patient is at baseline.  Will continue current management.  ENT evaluation possible tomorrow for left ear effusion.  Patient with no tenderness over the left ear.  1/4 : ENT evaluated the patient, effusions chronic will need outpatient ENT follow-up.  Patient overall status improved.  Back to baseline.  Plan for discharge tomorrow.  Assessment and Plan:  Confusion and disorientation-improved Attribute patient's infection. Treat the underlying cause.  Monitor with neurochecks. Aspiration and fall precaution.  Sepsis  Pt meets sepsis criteria. We will continue patient on rocephin and azithromycin.   Mastoid disorder, left Ct imaging shows left mastoid effusion.  Patient asymptomatic with no tenderness over the left ear however has hearing impairment. ENT consult tomorrow.  Tobacco abuse Nicotine patch .  HTN (hypertension) Vitals:   05/13/22 1600 05/13/22 1636 05/13/22 1730 05/13/22 1800  BP: (!) 159/65 (!) 151/77 134/86 (!) 154/67   05/13/22 1830  BP: (!) 148/74  Cont pt's  Imdur.   Hold losartan and amlodipine.  Hold propranolol.   Type II diabetes mellitus with renal manifestations (HCC) Accuchecks AC.HS and SSI Glycemic protocol.  Hold glipizide / Tyler Aas.   CKD (chronic kidney disease), stage IIIb Lab Results  Component Value Date   CREATININE 1.72 (H) 05/13/2022   CREATININE 1.60 (H) 12/20/2020   CREATININE 1.79 (H) 12/18/2020  Follow and avoid contrast.   CAP (community acquired pneumonia) Cont CAP iv abx regimen.  Supplemental oxygen if needed.  Pt found to have LLL PNA on imaging,.  Gastro-esophageal reflux disease without esophagitis Continue pepcid.      Subjective: Patient seen and examined this morning.  Vital labs and imaging reviewed.  Patient back to baseline.  Wife at bedside updated about status.  Physical Exam: Vitals:   05/14/22 1703 05/14/22 2033 05/15/22 0723 05/15/22 0737  BP: (!) 172/71 (!) 187/76 (!) 162/74 (!) 164/74  Pulse: (!) 59 68 65   Resp: 18 20 18    Temp: 98 F (36.7 C) 98.7 F (37.1 C) 98.6 F (37 C)   TempSrc:   Oral   SpO2: 97% 97% 97%   Weight:      Height:       Physical Exam Constitutional:      Appearance: Normal appearance.  HENT:     Head: Normocephalic and atraumatic.     Comments: Hearing impaired    Mouth/Throat:     Mouth: Mucous membranes are dry.  Eyes:     Extraocular Movements: Extraocular movements intact.     Pupils: Pupils are equal, round,  and reactive to light.  Cardiovascular:     Rate and Rhythm: Normal rate and regular rhythm.     Pulses: Normal pulses.  Pulmonary:     Effort: Pulmonary effort is normal.  Abdominal:     General: Abdomen is flat.     Palpations: Abdomen is soft.  Musculoskeletal:        General: Normal range of motion.     Cervical back: Normal range of motion.  Skin:    General: Skin is warm.  Neurological:     General: No focal deficit present.     Mental Status: He is alert.  Psychiatric:        Mood and Affect: Mood normal.        Behavior:  Behavior normal.      Family Communication: Wife updated about status.  Disposition: Status is: Observation The patient remains OBS appropriate and will d/c before 2 midnights.  Planned Discharge Destination: Home    Time spent: 34  minutes  Author: Oran Rein, MD 05/15/2022 1:46 PM  For on call review [email or web address].

## 2022-05-16 DIAGNOSIS — R410 Disorientation, unspecified: Secondary | ICD-10-CM | POA: Diagnosis not present

## 2022-05-16 DIAGNOSIS — A419 Sepsis, unspecified organism: Secondary | ICD-10-CM | POA: Diagnosis not present

## 2022-05-16 LAB — GLUCOSE, CAPILLARY
Glucose-Capillary: 162 mg/dL — ABNORMAL HIGH (ref 70–99)
Glucose-Capillary: 272 mg/dL — ABNORMAL HIGH (ref 70–99)
Glucose-Capillary: 226 mg/dL — ABNORMAL HIGH (ref 70–99)
Glucose-Capillary: 196 mg/dL — ABNORMAL HIGH (ref 70–99)

## 2022-05-16 LAB — CBC
HCT: 41.4 % (ref 39.0–52.0)
Hemoglobin: 14.7 g/dL (ref 13.0–17.0)
MCH: 30.2 pg (ref 26.0–34.0)
MCHC: 35.5 g/dL (ref 30.0–36.0)
MCV: 85 fL (ref 80.0–100.0)
Platelets: 187 10*3/uL (ref 150–400)
RBC: 4.87 MIL/uL (ref 4.22–5.81)
RDW: 13.3 % (ref 11.5–15.5)
WBC: 8.7 10*3/uL (ref 4.0–10.5)
nRBC: 0 % (ref 0.0–0.2)

## 2022-05-16 MED ORDER — AMLODIPINE BESYLATE 10 MG PO TABS
10.0000 mg | ORAL_TABLET | Freq: Every day | ORAL | Status: DC
  Administered 2022-05-16 – 2022-05-17 (×2): 10 mg via ORAL
  Filled 2022-05-16 (×2): qty 1

## 2022-05-16 MED ORDER — NITROGLYCERIN 0.4 MG SL SUBL: 0.4000 mg | SUBLINGUAL_TABLET | SUBLINGUAL | Status: DC | PRN

## 2022-05-16 MED ORDER — TAMSULOSIN HCL 0.4 MG PO CAPS
0.4000 mg | ORAL_CAPSULE | Freq: Every day | ORAL | Status: DC
  Administered 2022-05-16 – 2022-05-17 (×2): 0.4 mg via ORAL
  Filled 2022-05-16 (×2): qty 1

## 2022-05-16 MED ORDER — PRIMIDONE 50 MG PO TABS
50.0000 mg | ORAL_TABLET | Freq: Two times a day (BID) | ORAL | Status: DC
  Administered 2022-05-16 – 2022-05-17 (×3): 50 mg via ORAL
  Filled 2022-05-16 (×3): qty 1

## 2022-05-16 MED ORDER — PANTOPRAZOLE SODIUM 40 MG PO TBEC
80.0000 mg | DELAYED_RELEASE_TABLET | Freq: Every day | ORAL | Status: DC
  Administered 2022-05-16 – 2022-05-17 (×2): 80 mg via ORAL
  Filled 2022-05-16 (×2): qty 2

## 2022-05-16 MED ORDER — MAGNESIUM OXIDE 400 MG PO TABS
400.0000 mg | ORAL_TABLET | Freq: Every day | ORAL | Status: DC
  Administered 2022-05-16 – 2022-05-17 (×2): 400 mg via ORAL
  Filled 2022-05-16 (×4): qty 1

## 2022-05-16 MED ORDER — ESCITALOPRAM OXALATE 10 MG PO TABS
20.0000 mg | ORAL_TABLET | Freq: Every day | ORAL | Status: DC
  Administered 2022-05-16: 20 mg via ORAL
  Filled 2022-05-16: qty 2

## 2022-05-16 MED ORDER — PROPRANOLOL HCL 20 MG PO TABS
20.0000 mg | ORAL_TABLET | Freq: Two times a day (BID) | ORAL | Status: DC
  Administered 2022-05-16 – 2022-05-17 (×3): 20 mg via ORAL
  Filled 2022-05-16 (×3): qty 1

## 2022-05-16 MED ORDER — HYOSCYAMINE SULFATE ER 0.375 MG PO TB12
0.3750 mg | ORAL_TABLET | Freq: Two times a day (BID) | ORAL | Status: DC
  Administered 2022-05-16 – 2022-05-17 (×3): 0.375 mg via ORAL
  Filled 2022-05-16 (×3): qty 1

## 2022-05-16 MED ORDER — ISOSORBIDE MONONITRATE ER 30 MG PO TB24: 60.0000 mg | ORAL_TABLET | Freq: Every day | ORAL | Status: DC

## 2022-05-16 MED ORDER — LOSARTAN POTASSIUM 50 MG PO TABS
100.0000 mg | ORAL_TABLET | Freq: Every day | ORAL | Status: DC
  Administered 2022-05-16 – 2022-05-17 (×2): 100 mg via ORAL
  Filled 2022-05-16 (×2): qty 2

## 2022-05-16 MED ORDER — FAMOTIDINE 20 MG PO TABS
20.0000 mg | ORAL_TABLET | Freq: Every day | ORAL | Status: DC
  Administered 2022-05-16: 20 mg via ORAL
  Filled 2022-05-16: qty 1

## 2022-05-16 MED ORDER — ATORVASTATIN CALCIUM 20 MG PO TABS: 10.0000 mg | ORAL_TABLET | Freq: Every day | ORAL | Status: DC

## 2022-05-16 NOTE — Inpatient Diabetes Management (Signed)
Inpatient Diabetes Program Recommendations  AACE/ADA: New Consensus Statement on Inpatient Glycemic Control (2015)  Target Ranges:  Prepandial:   less than 140 mg/dL      Peak postprandial:   less than 180 mg/dL (1-2 hours)      Critically ill patients:  140 - 180 mg/dL   Lab Results  Component Value Date   GLUCAP 162 (H) 05/16/2022   HGBA1C 8.5 (H) 05/13/2022    Review of Glycemic Control  Latest Reference Range & Units 05/15/22 07:26 05/15/22 11:52 05/15/22 16:08 05/15/22 20:37 05/16/22 07:41  Glucose-Capillary 70 - 99 mg/dL 147 (H) 222 (H) 240 (H) 315 (H) 162 (H)  (H): Data is abnormally high  Diabetes history: DM2 Outpatient Diabetes medications: Tresiba 16 units QD, Glipizide 10 mg QD Current orders for Inpatient glycemic control: Novolog 0-15 units TID  Inpatient Diabetes Program Recommendations:    Postprandials elevated.  Takes Glipizide at home.  Please consider:  Novolog 2 units TID with meals if he consumes at least 50%  Will continue to follow while inpatient.  Thank you, Reche Dixon, MSN, [place] [facility] Coordinator Inpatient Diabetes Program [phone] (team pager from 8a-5p)

## 2022-05-16 NOTE — Progress Notes (Signed)
. Progress Note   Patient: Richard Chapman [id number] DOB: [birth year] DOA: 05/13/2022     2 DOS: the patient was seen and examined on 05/16/2022   Brief hospital course:  82 y.o. male brought by family 67 emergency room for confusion.  Patient on arrival did not understand why he was here in the hospital.  Patient sent to [place] from urgent care for his mental status.  Patient is cooperative and nonfocal.  Speech is clear and verbalizes understanding of plan.  He does have a past medical history of CKD hypertension and prostate cancer.  Family was worried about fever and gave him Tylenol patient has been coughing for about 4 weeks but not confused until recently.  Patient noted to have difficulty with ambulation and is high fall risk.  Malodorous urine per EMS note.  Patient is a current smoker.  Confusion is acute and new source of unclear.   3/31 : Patient symptomatically improved, wife at bedside states patient is at baseline.  Will continue current management.  ENT evaluation possible tomorrow for left ear effusion.  Patient with no tenderness over the left ear. 4/1  : ENT evaluated the patient, effusions chronic will need outpatient ENT follow-up.  Patient overall status improved.  Back to baseline.    4/2 : Patient continues to stay on antibiotics day 4 of antibiotics today.  Was little confused and hypertensive.  Plan for discharge tomorrow   Assessment and Plan:  Confusion and disorientation-improved Attribute patient's infection. Treat the underlying cause.  Monitor with neurochecks. Aspiration and fall precaution.  Sepsis  Pt meets sepsis criteria. We will continue patient on rocephin and azithromycin.  Day 4 today  Mastoid disorder, left Ct imaging shows left mastoid effusion.  Patient asymptomatic with no tenderness over the left ear however has hearing impairment. ENT consult tomorrow.  Tobacco abuse Nicotine patch .  HTN (hypertension) Vitals:   05/13/22 1600 05/13/22  1636 05/13/22 1730 05/13/22 1800  BP: (!) 159/65 (!) 151/77 134/86 (!) 154/67   05/13/22 1830  BP: (!) 148/74  Cont pt's  Imdur.  Hold losartan and amlodipine.  Hold propranolol.   Type II diabetes mellitus with renal manifestations (HCC) Accuchecks AC.HS and SSI Glycemic protocol.  Hold glipizide / Tyler Aas.   CKD (chronic kidney disease), stage IIIb Lab Results  Component Value Date   CREATININE 1.72 (H) 05/13/2022   CREATININE 1.60 (H) 12/20/2020   CREATININE 1.79 (H) 12/18/2020  Follow and avoid contrast.   CAP (community acquired pneumonia) Cont CAP iv abx regimen.  Supplemental oxygen if needed.  Pt found to have LLL PNA on imaging,.  Gastro-esophageal reflux disease without esophagitis Continue pepcid.      Subjective: Patient seen and examined this morning.  Vital labs and imaging reviewed.  Patient has been hypertensive for the past 24 hours likely secondary to missing her home medications.  Mildly confused as per wife at bedside.  No complaint of chest pain or shortness of breath.  Physical Exam: Vitals:   05/16/22 0504 05/16/22 0828 05/16/22 0844 05/16/22 0933  BP: (!) 166/63 (!) 193/63 (!) 192/72 (!) 158/66  Pulse: 72 65 72   Resp: 17 18 19    Temp: (!) 97.5 F (36.4 C) 98 F (36.7 C)    TempSrc: Oral Oral    SpO2: 98% 98% 98%   Weight:      Height:       Physical Exam Constitutional:      Appearance: Normal appearance.  HENT:  Head: Normocephalic and atraumatic.     Comments: Hearing impaired    Mouth/Throat:     Mouth: Mucous membranes are dry.  Eyes:     Extraocular Movements: Extraocular movements intact.     Pupils: Pupils are equal, round, and reactive to light.  Cardiovascular:     Rate and Rhythm: Normal rate and regular rhythm.     Pulses: Normal pulses.  Pulmonary:     Effort: Pulmonary effort is normal.  Abdominal:     General: Abdomen is flat.     Palpations: Abdomen is soft.  Musculoskeletal:        General: Normal range of  motion.     Cervical back: Normal range of motion.  Skin:    General: Skin is warm.  Neurological:     General: No focal deficit present.     Mental Status: He is alert.  Psychiatric:        Mood and Affect: Mood normal.        Behavior: Behavior normal.      Family Communication: Wife updated about status.  Disposition: Status is: Inpatient  Planned Discharge Destination: Home    Time spent: 31   minutes  Author: Oran Rein, MD 05/16/2022 1:51 PM  For on call review [email or web address].

## 2022-05-16 NOTE — Progress Notes (Signed)
Physical Therapy Treatment Patient Details Name: Richard Chapman MRN: [id number] DOB: 02-15-1940 Today's Date: 05/16/2022   History of Present Illness Richard Chapman is an 38yoM who comes to [facility] due to confusion. Workup showed PNA. PMH: HTN, HLd, DM, stroke, GERD, depression/GAD, OSA, not on CPAP, HOH, SDH, PrCA, BPH, CKD3b, CAD.    PT Comments    Pt received upright in bed agreeable to PT services. Pt exits bed at minguard to light minA (HHA+1) and bed features to sit upright. Pt able to stand to RW with max VC's for hand placement with poor carryover but completes without physical support. Pt ambulates today ~70' with short step through gait with RW at [facility] with frequent VC's for environmental scanning as pt tends to look at the floor. Only fair carryover but is steady and consistent with his steps. Pt is able to complete stair training with transportation to PT gym via recliner. Pt's spouse present reporting his stair navigation is at his baseline. Pt returning to room in recliner with all needs in reach. Continue to recommend f/u services to address balance/gait impairments.   Recommendations for follow up therapy are one component of a multi-disciplinary discharge planning process, led by the attending physician.  Recommendations may be updated based on patient status, additional functional criteria and insurance authorization.     Assistance Recommended at Discharge Frequent or constant Supervision/Assistance  Patient can return home with the following Help with stairs or ramp for entrance;Direct supervision/assist for medications management;Assistance with cooking/housework;A little help with bathing/dressing/bathroom   Equipment Recommendations  None recommended by PT    Recommendations for Other Services       Precautions / Restrictions Precautions Precautions: Fall Restrictions Weight Bearing Restrictions: No     Mobility  Bed Mobility               General bed  mobility comments: NT. In recliner pre and psot testing Patient Response: Cooperative  Transfers Overall transfer level: Needs assistance Equipment used: Rolling walker (2 wheels) Transfers: Sit to/from Stand Sit to Stand: Min guard           General transfer comment: max VC's for hand placement from bed. Safe STS efforts from chairs with arm rests.    Ambulation/Gait Ambulation/Gait assistance: Min guard Gait Distance (Feet): 80 Feet Assistive device: Rolling walker (2 wheels) Gait Pattern/deviations: Step-through pattern, Trunk flexed       General Gait Details: VC's for environmental scanning as pt is kyphotic liking to look down at his feet.   Stairs Stairs: Yes Stairs assistance: Min guard Stair Management: One rail Right, One rail Left, Two rails, Step to pattern, Forwards Number of Stairs: 4 General stair comments: asc/desc safely. Spouse reports his stair mobility is at baseline.   Wheelchair Mobility    Modified Rankin (Stroke Patients Only)       Balance Overall balance assessment: Needs assistance Sitting-balance support: Feet supported Sitting balance-Leahy Scale: Good     Standing balance support: Bilateral upper extremity supported, During functional activity Standing balance-Leahy Scale: Good                              Cognition Arousal/Alertness: Awake/alert Behavior During Therapy: WFL for tasks assessed/performed Overall Cognitive Status: Impaired/Different from baseline  Exercises      General Comments        Pertinent Vitals/Pain Pain Assessment Pain Assessment: No/denies pain    Home Living                          Prior Function            PT Goals (current goals can now be found in the care plan section) Acute Rehab PT Goals Patient Stated Goal: to go home PT Goal Formulation: With patient Time For Goal Achievement:  05/28/22 Potential to Achieve Goals: Good Progress towards PT goals: Progressing toward goals    Frequency    Min 2X/week      PT Plan Current plan remains appropriate    Co-evaluation              AM-PAC PT "6 Clicks" Mobility   Outcome Measure  Help needed turning from your back to your side while in a flat bed without using bedrails?: None Help needed moving from lying on your back to sitting on the side of a flat bed without using bedrails?: None Help needed moving to and from a bed to a chair (including a wheelchair)?: None Help needed standing up from a chair using your arms (e.g., wheelchair or bedside chair)?: A Little Help needed to walk in hospital room?: A Little Help needed climbing 3-5 steps with a railing? : A Little 6 Click Score: 21    End of Session Equipment Utilized During Treatment: Gait belt Activity Tolerance: Patient tolerated treatment well Patient left: with chair alarm set;in chair;with call bell/phone within reach;with family/visitor present Nurse Communication: Mobility status PT Visit Diagnosis: Other abnormalities of gait and mobility (R26.89);History of falling (Z91.81);Muscle weakness (generalized) (M62.81)     Time: 0902-0920 PT Time Calculation (min) (ACUTE ONLY): 18 min  Charges:  $Gait Training: 8-22 mins                    Salem Caster. Fairly IV, PT, DPT Physical Therapist- [facility]  05/16/2022, 10:23 AM

## 2022-05-17 DIAGNOSIS — R410 Disorientation, unspecified: Secondary | ICD-10-CM | POA: Diagnosis not present

## 2022-05-17 DIAGNOSIS — A419 Sepsis, unspecified organism: Secondary | ICD-10-CM | POA: Diagnosis not present

## 2022-05-17 DIAGNOSIS — I2510 Atherosclerotic heart disease of native coronary artery without angina pectoris: Secondary | ICD-10-CM

## 2022-05-17 LAB — GLUCOSE, CAPILLARY
Glucose-Capillary: 178 mg/dL — ABNORMAL HIGH (ref 70–99)
Glucose-Capillary: 214 mg/dL — ABNORMAL HIGH (ref 70–99)

## 2022-05-17 MED ORDER — NITROGLYCERIN 0.4 MG SL SUBL: 0.4000 mg | SUBLINGUAL_TABLET | SUBLINGUAL | 12 refills | Status: AC | PRN

## 2022-05-17 MED ORDER — AMOXICILLIN-POT CLAVULANATE 875-125 MG PO TABS
1.0000 | ORAL_TABLET | Freq: Two times a day (BID) | ORAL | Status: DC
  Administered 2022-05-17: 1 via ORAL
  Filled 2022-05-17: qty 1

## 2022-05-17 MED ORDER — AMOXICILLIN-POT CLAVULANATE 875-125 MG PO TABS: 1.0000 | ORAL_TABLET | Freq: Two times a day (BID) | ORAL | 0 refills | Status: AC

## 2022-05-17 MED ORDER — AZITHROMYCIN 250 MG PO TABS
250.0000 mg | ORAL_TABLET | Freq: Every day | ORAL | Status: DC
  Administered 2022-05-17: 250 mg via ORAL
  Filled 2022-05-17: qty 1

## 2022-05-17 MED ORDER — AZITHROMYCIN 250 MG PO TABS: 250.0000 mg | ORAL_TABLET | Freq: Every day | ORAL | 0 refills | Status: DC

## 2022-05-17 NOTE — Care Management Important Message (Signed)
Important Message  Patient Details  Name: Mosiah Rexroth MRN: [id number] Date of Birth: 11/30/1940   Medicare Important Message Given:  Yes     Dannette Barbara 05/17/2022, 11:07 AM

## 2022-05-17 NOTE — Inpatient Diabetes Management (Signed)
Inpatient Diabetes Program Recommendations  AACE/ADA: New Consensus Statement on Inpatient Glycemic Control (2015)  Target Ranges:  Prepandial:   less than 140 mg/dL      Peak postprandial:   less than 180 mg/dL (1-2 hours)      Critically ill patients:  140 - 180 mg/dL   Lab Results  Component Value Date   GLUCAP 178 (H) 05/17/2022   HGBA1C 8.5 (H) 05/13/2022    Review of Glycemic Control  Latest Reference Range & Units 05/16/22 07:41 05/16/22 11:29 05/16/22 16:15 05/16/22 21:11 05/17/22 07:35  Glucose-Capillary 70 - 99 mg/dL 162 (H) 272 (H) 226 (H) 196 (H) 178 (H)  (H): Data is abnormally high  Diabetes history: DM2 Outpatient Diabetes medications: Tresiba 16 units QD, Glipizide 10 mg QD Current orders for Inpatient glycemic control: Novolog 0-15 units TID   Inpatient Diabetes Program Recommendations:     Postprandials elevated.  Takes Glipizide at home.  Please consider:   Novolog 2 units TID with meals if he consumes at least 50%   Will continue to follow while inpatient.   Thank you, Reche Dixon, MSN, [place] Diabetes Coordinator Inpatient Diabetes Program [phone] (team pager from 8a-5p)

## 2022-05-17 NOTE — Discharge Summary (Signed)
Physician Discharge Summary   Patient: Richard Chapman MRN: [id number] DOB: February 19, 1940  Admit date:     05/13/2022  Discharge date: 05/17/22  Discharge Physician: Oran Rein   PCP: Leonel Ramsay, MD   Recommendations at discharge:   Follow-up with PCP in 1 to 2 weeks.  Discharge Diagnoses: Principal Problem:   Confusion and disorientation Active Problems:   Sepsis   CAD in native artery   Gastro-esophageal reflux disease without esophagitis   CAP (community acquired pneumonia)   CKD (chronic kidney disease), stage IIIb   Type II diabetes mellitus with renal manifestations   HTN (hypertension)   Tobacco abuse   Mastoid disorder, left  Resolved Problems:   * No resolved hospital problems. *  Hospital Course:   82 y.o. male brought by family 68 emergency room for confusion.  Patient on arrival did not understand why he was here in the hospital.  Patient sent to [place] from urgent care for his mental status.  Patient is cooperative and nonfocal.  Speech is clear and verbalizes understanding of plan.  He does have a past medical history of CKD hypertension and prostate cancer.  Family was worried about fever and gave him Tylenol patient has been coughing for about 4 weeks but not confused until recently.  Patient noted to have difficulty with ambulation and is high fall risk.  Malodorous urine per EMS note.  Patient is a current smoker.  Confusion is acute and new source of unclear.    3/31 : Patient symptomatically improved, wife at bedside states patient is at baseline.  Will continue current management.  ENT evaluation possible tomorrow for left ear effusion.  Patient with no tenderness over the left ear. 4/1  : ENT evaluated the patient, effusions chronic will need outpatient ENT follow-up.  Patient overall status improved.  Back to baseline.     4/2 : Patient continues to stay on antibiotics day 4 of antibiotics today.  Was little confused and hypertensive.  Plan for  discharge tomorrow   4/2 : Patient AMS improved was back to baseline as per wife at bedside.  Will continue antibiotic treatment for total of 10 days.  Discharge the patient on Augmentin azithromycin.  At the time of discharge patient was hemodynamically stable with no active complaints.  Assessment and Plan: Confusion and disorientation  Attribute patient's infection. -Pneumonia which improved after 4 days of antibiotics.  Patient back to baseline.  Discharged home with instruction to follow-up with PCP. Treat the underlying cause.  Monitor with neurochecks. Aspiration and fall precaution.  Sepsis Vitals:   05/14/22 0011 05/14/22 0116 05/14/22 0300 05/14/22 0455  BP: (!) 149/77   (!) 149/59  Pulse: 63   63  Resp: 18 17  20   Temp: (!) 101.3 F (38.5 C) 100.3 F (37.9 C) 99.4 F (37.4 C) 98.5 F (36.9 C)  TempSrc:      SpO2: 97%   97%  Weight:      Height:      Pt meets sepsis criteria. We will continue patient on rocephin and azithromycin.   Mastoid disorder, left Ct imaging shows left mastoid effusion. ENT consult per AM team.   Tobacco abuse Nicotine patch .  HTN (hypertension) Vitals:   05/13/22 1600 05/13/22 1636 05/13/22 1730 05/13/22 1800  BP: (!) 159/65 (!) 151/77 134/86 (!) 154/67   05/13/22 1830  BP: (!) 148/74  Cont pt's  Imdur.  Hold losartan and amlodipine.  Hold propranolol.   Type II diabetes mellitus  with renal manifestations Accuchecks AC.HS and SSI Glycemic protocol.  Hold glipizide / Tyler Aas.   CKD (chronic kidney disease), stage IIIb Lab Results  Component Value Date   CREATININE 1.72 (H) 05/13/2022   CREATININE 1.60 (H) 12/20/2020   CREATININE 1.79 (H) 12/18/2020  Follow and avoid contrast.  CAP (community acquired pneumonia) Cont CAP iv abx regimen.  Supplemental oxygen if needed.  Pt found to have LLL PNA on imaging,.  Gastro-esophageal reflux disease without esophagitis Continue pepcid.      Pain control - [employer]  Controlled Substance Reporting System database was reviewed. and patient was instructed, not to drive, operate heavy machinery, perform activities at heights, swimming or participation in water activities or provide baby-sitting services while on Pain, Sleep and Anxiety Medications; until their outpatient Physician has advised to do so again. Also recommended to not to take more than prescribed Pain, Sleep and Anxiety Medications.  Consultants: None Procedures performed: None  Disposition: Home Diet recommendation:  Discharge Diet Orders (From admission, onward)     Start     Ordered   05/17/22 0000  Diet - low sodium heart healthy        05/17/22 1410           Cardiac diet DISCHARGE MEDICATION: Allergies as of 05/17/2022       Reactions   Lisinopril Other (See Comments), Cough        Medication List     TAKE these medications    acetaminophen 325 MG tablet Commonly known as: TYLENOL Take 2 tablets (650 mg total) every 6 (six) hours as needed by mouth for mild pain (or Fever >/= 101).   amLODipine 10 MG tablet Commonly known as: NORVASC Take 1 tablet (10 mg total) by mouth daily.   amoxicillin-clavulanate 875-125 MG tablet Commonly known as: AUGMENTIN Take 1 tablet by mouth every 12 (twelve) hours for 6 days.   atorvastatin 10 MG tablet Commonly known as: LIPITOR Take 10 mg by mouth daily.   azithromycin 250 MG tablet Commonly known as: ZITHROMAX Take 1 tablet (250 mg total) by mouth daily.   cetirizine 10 MG tablet Commonly known as: ZYRTEC Take 10 mg by mouth daily.   Cholecalciferol 50 MCG (2000 UT) Tabs Take 2,000 Units by mouth daily.   clopidogrel 75 MG tablet Commonly known as: PLAVIX Take 1 tablet (75 mg total) by mouth daily.   Combivent Respimat 20-100 MCG/ACT Aers respimat Generic drug: Ipratropium-Albuterol Inhale 1 puff into the lungs every 6 (six) hours.   cyanocobalamin 1000 MCG tablet Commonly known as: VITAMIN B12 Take 1,000 mcg by  mouth daily.   docusate sodium 100 MG capsule Commonly known as: COLACE Take 1 capsule (100 mg total) by mouth 2 (two) times daily.   escitalopram 20 MG tablet Commonly known as: LEXAPRO Take 1 tablet (20 mg total) by mouth daily. What changed: when to take this   esomeprazole 20 MG capsule Commonly known as: NEXIUM Take 1 capsule (20 mg total) by mouth AC breakfast.   famotidine 20 MG tablet Commonly known as: PEPCID Take 1 tablet by mouth at bedtime.   feeding supplement (GLUCERNA 1.2 CAL) Liqd Take by mouth.   fluticasone 50 MCG/ACT nasal spray Commonly known as: FLONASE Place 1 spray into both nostrils daily.   gabapentin 300 MG capsule Commonly known as: NEURONTIN TAKE ONE CAPSULE BY MOUTH AT BEDTIME FOR NERVE PAIN   glipiZIDE 10 MG tablet Commonly known as: GLUCOTROL Take 10 mg by mouth daily before breakfast.  hyoscyamine 0.375 MG 12 hr tablet Commonly known as: LEVBID Take 0.375 mg by mouth every 12 (twelve) hours.   isosorbide mononitrate 60 MG 24 hr tablet Commonly known as: IMDUR Take 1 tablet (60 mg total) by mouth daily.   isosorbide mononitrate 60 MG 24 hr tablet Commonly known as: IMDUR Take 60 mg by mouth 2 (two) times daily.   losartan 100 MG tablet Commonly known as: COZAAR Take 100 mg by mouth daily.   magnesium oxide 400 MG tablet Commonly known as: MAG-OX Take 400 mg by mouth daily.   nitroGLYCERIN 0.4 MG SL tablet Commonly known as: NITROSTAT Place 0.4 mg under the tongue every 5 (five) minutes as needed for chest pain. What changed: Another medication with the same name was added. Make sure you understand how and when to take each.   nitroGLYCERIN 0.4 MG SL tablet Commonly known as: NITROSTAT Place 1 tablet (0.4 mg total) under the tongue every 5 (five) minutes as needed for chest pain. What changed: You were already taking a medication with the same name, and this prescription was added. Make sure you understand how and when to  take each.   OneTouch Verio test strip Generic drug: glucose blood SMARTSIG:1 Each Via Meter Daily   primidone 50 MG tablet Commonly known as: MYSOLINE Take 1 tablet by mouth 2 (two) times daily.   propranolol 40 MG tablet Commonly known as: INDERAL TAKE ONE-HALF TABLET BY MOUTH TWO TIMES A DAY FOR BLOOD PRESSURE   tamsulosin 0.4 MG Caps capsule Commonly known as: FLOMAX Take 0.4 mg by mouth daily.   traZODone 100 MG tablet Commonly known as: DESYREL Take 1 tablet (100 mg total) by mouth at bedtime.   Tyler Aas FlexTouch 200 UNIT/ML FlexTouch Pen Generic drug: insulin degludec Inject 16 Units into the skin daily.   white petrolatum Oint Commonly known as: VASELINE Apply 1 application topically as needed (Wound care).               Durable Medical Equipment  (From admission, onward)           Start     Ordered   05/17/22 1203  For home use only DME 4 wheeled rolling walker with seat  Once       Question:  Patient needs a walker to treat with the following condition  Answer:  Imbalance   05/17/22 1202            Discharge Exam: Filed Weights   05/13/22 1600 05/13/22 2159  Weight: 86.2 kg 77.8 kg   Physical Exam Constitutional:      Appearance: Normal appearance.  Eyes:     Extraocular Movements: Extraocular movements intact.     Pupils: Pupils are equal, round, and reactive to light.  Cardiovascular:     Rate and Rhythm: Normal rate and regular rhythm.  Pulmonary:     Effort: Pulmonary effort is normal.  Abdominal:     General: Abdomen is flat.  Musculoskeletal:        General: Normal range of motion.     Cervical back: Normal range of motion.  Skin:    General: Skin is warm.  Neurological:     General: No focal deficit present.     Mental Status: He is alert and oriented to person, place, and time.  Psychiatric:        Mood and Affect: Mood normal.        Behavior: Behavior normal.      Condition at discharge:  good  The results of  significant diagnostics from this hospitalization (including imaging, microbiology, ancillary and laboratory) are listed below for reference.   Imaging Studies: CT HEAD WO CONTRAST (5MM)  Addendum Date: 05/15/2022   ADDENDUM REPORT: 05/15/2022 09:26 ADDENDUM: The left sided middle ear and mastoid effusion were present on prior imaging from 01/10/21. The area of dural thickening overlying this region is new (series 5, image 50-52) from 2022. While this may be artifactual, further evaluation with a contrast enhanced brain MRI could be considered for more definitive characterization. Electronically Signed   By: Marin Roberts M.D.   On: 05/15/2022 09:26   Result Date: 05/15/2022 CLINICAL DATA:  Altered mental status EXAM: CT HEAD WITHOUT CONTRAST TECHNIQUE: Contiguous axial images were obtained from the base of the skull through the vertex without intravenous contrast. RADIATION DOSE REDUCTION: This exam was performed according to the departmental dose-optimization program which includes automated exposure control, adjustment of the mA and/or kV according to patient size and/or use of iterative reconstruction technique. COMPARISON:  CT Head 01/10/21 FINDINGS: Brain: No evidence of acute infarction, hemorrhage, hydrocephalus, extra-axial collection or mass lesion/mass effect. Sequela of severe chronic microvascular ischemic change, unchanged from prior exam. Advanced generalized volume loss. 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 There is very mild asymmetric dural thickening in the dural overlying the left-sided mastoid air cells (series 4, image 44/series 5, image 51). Vascular: No hyperdense vessel or unexpected calcification. Skull: Normal. Negative for fracture or focal lesion. Sinuses/Orbits: Left-sided mastoid effusion and left middle ear effusion. Polypoid mucosal thickening in the bilateral maxillary sinuses.  Bilateral lens replacement orbits are otherwise unremarkable. Other: None. IMPRESSION: 1. No acute intracranial abnormality. 2. Left-sided mastoid effusion and left middle ear effusion. Correlate for signs and symptoms of otomastoiditis. 3. Very mild asymmetric dural thickening overlying the left-sided mastoid air cells, which may be reactive. Electronically Signed: By: Marin Roberts M.D. On: 05/13/2022 17:23   CT CHEST WO CONTRAST  Result Date: 05/13/2022 CLINICAL DATA:  Cough, respiratory illness EXAM: CT CHEST WITHOUT CONTRAST TECHNIQUE: Multidetector CT imaging of the chest was performed following the standard protocol without IV contrast. RADIATION DOSE REDUCTION: This exam was performed according to the departmental dose-optimization program which includes automated exposure control, adjustment of the mA and/or kV according to patient size and/or use of iterative reconstruction technique. COMPARISON:  05/13/2022, 05/12/2020 FINDINGS: Cardiovascular: Unenhanced imaging of the heart is unremarkable without pericardial effusion. Normal caliber of the thoracic aorta. Atherosclerosis of the aorta and coronary vasculature. Evaluation of the vascular lumen is limited without IV contrast. Mediastinum/Nodes: Stable 3.1 cm right lobe thyroid nodule that was previously biopsied. Trachea and esophagus are unremarkable. No pathologic adenopathy. Lungs/Pleura: Patchy left lower lobe airspace disease consistent with bronchopneumonia. Mild bibasilar bronchial wall thickening. No effusion or pneumothorax. Central airways are patent. Stable benign 3 mm right upper lobe pulmonary nodule reference image 59/2. Upper Abdomen: No acute upper abdominal finding. Musculoskeletal: There are no acute displaced fractures. Prior healed lower left rib fractures are noted. Postsurgical changes from lower cervical ACDF. 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 Reconstructed images demonstrate no additional  findings. IMPRESSION: 1. Bibasilar bronchial wall thickening, with left lower lobe consolidation consistent with bronchopneumonia. Follow up after appropriate medical management is recommended to document resolution. 2. Aortic Atherosclerosis (ICD10-I70.0). Coronary artery atherosclerosis. Electronically Signed   By: Randa Ngo M.D.   On: 05/13/2022 19:19   DG Chest  Portable 1 View  Result Date: 05/13/2022 CLINICAL DATA:  cough EXAM: PORTABLE CHEST - 1 VIEW COMPARISON:  12/18/2020 FINDINGS: Lower lung volumes with some patchy atelectasis or infiltrate in the infrahilar regions and lung bases, left greater than right. No overt interstitial edema. Heart size and mediastinal contours are within normal limits. Aortic Atherosclerosis (ICD10-170.0). No effusion. Cervical fixation hardware. IMPRESSION: Low volumes with bibasilar atelectasis or infiltrates, left greater than right. Electronically Signed   By: Lucrezia Europe M.D.   On: 05/13/2022 16:21    Microbiology: Results for orders placed or performed during the hospital encounter of 05/13/22  Resp panel by RT-PCR (RSV, Flu A&B, Covid) Anterior Nasal Swab     Status: None   Collection Time: 05/13/22  4:19 PM   Specimen: Anterior Nasal Swab  Result Value Ref Range Status   SARS Coronavirus 2 by RT PCR NEGATIVE NEGATIVE Final    Comment: (NOTE) SARS-CoV-2 target nucleic acids are NOT DETECTED.  The SARS-CoV-2 RNA is generally detectable in upper respiratory specimens during the acute phase of infection. The lowest concentration of SARS-CoV-2 viral copies this assay can detect is 138 copies/mL. A negative result does not preclude SARS-Cov-2 infection and should not be used as the sole basis for treatment or other patient management decisions. A negative result may occur with  improper specimen collection/handling, submission of specimen other than nasopharyngeal swab, presence of viral mutation(s) within the areas targeted by this assay, and  inadequate number of viral copies(<138 copies/mL). A negative result must be combined with clinical observations, patient history, and epidemiological information. The expected result is Negative.  Fact Sheet for Patients:  EntrepreneurPulse.com.au  Fact Sheet for Healthcare Providers:  IncredibleEmployment.be  This test is no t yet approved or cleared by the [place] FDA and  has been authorized for detection and/or diagnosis of SARS-CoV-2 by FDA under an Emergency Use Authorization (EUA). This EUA will remain  in effect (meaning this test can be used) for the duration of the COVID-19 declaration under Section 564(b)(1) of the Act, 21 U.S.C.section 360bbb-3(b)(1), unless the authorization is terminated  or revoked sooner.       Influenza A by PCR NEGATIVE NEGATIVE Final   Influenza B by PCR NEGATIVE NEGATIVE Final    Comment: (NOTE) The Xpert Xpress SARS-CoV-2/FLU/RSV plus assay is intended as an aid in the diagnosis of influenza from Nasopharyngeal swab specimens and should not be used as a sole basis for treatment. Nasal washings and aspirates are unacceptable for Xpert Xpress SARS-CoV-2/FLU/RSV testing.  Fact Sheet for Patients: EntrepreneurPulse.com.au  Fact Sheet for Healthcare Providers: IncredibleEmployment.be  This test is not yet approved or cleared by the [place] FDA and has been authorized for detection and/or diagnosis of SARS-CoV-2 by FDA under an Emergency Use Authorization (EUA). This EUA will remain in effect (meaning this test can be used) for the duration of the COVID-19 declaration under Section 564(b)(1) of the Act, 21 U.S.C. section 360bbb-3(b)(1), unless the authorization is terminated or revoked.     Resp Syncytial Virus by PCR NEGATIVE NEGATIVE Final    Comment: (NOTE) Fact Sheet for Patients: EntrepreneurPulse.com.au  Fact Sheet for Healthcare  Providers: IncredibleEmployment.be  This test is not yet approved or cleared by the [place] FDA and has been authorized for detection and/or diagnosis of SARS-CoV-2 by FDA under an Emergency Use Authorization (EUA). This EUA will remain in effect (meaning this test can be used) for the duration of the COVID-19 declaration under Section 564(b)(1) of the Act, 21  U.S.C. section 360bbb-3(b)(1), unless the authorization is terminated or revoked.  Performed at [facility], [address] [place], [place] [address]   Blood culture (routine x 2)     Status: None (Preliminary result)   Collection Time: 05/13/22  8:26 PM   Specimen: BLOOD  Result Value Ref Range Status   Specimen Description BLOOD BLOOD LEFT ARM  Final   Special Requests   Final    BOTTLES DRAWN AEROBIC AND ANAEROBIC Blood Culture adequate volume   Culture   Final    NO GROWTH 4 DAYS Performed at [facility], [place], [address] [place], [place] [address]    Report Status PENDING  Incomplete  Blood culture (routine x 2)     Status: None (Preliminary result)   Collection Time: 05/13/22  8:26 PM   Specimen: BLOOD  Result Value Ref Range Status   Specimen Description BLOOD BLOOD LEFT ARM  Final   Special Requests   Final    BOTTLES DRAWN AEROBIC AND ANAEROBIC Blood Culture adequate volume   Culture   Final    NO GROWTH 4 DAYS Performed at [facility], [address] [place], [place] [address]    Report Status PENDING  Incomplete    Labs: CBC: Recent Labs  Lab 05/13/22 1619 05/13/22 2026 05/14/22 0630 05/15/22 0906 05/16/22 1021  WBC 20.0* 22.4* 17.3* 11.8* 8.7  NEUTROABS  --   --  12.6*  --   --   HGB 14.7 14.4 13.5 14.0 14.7  HCT 42.9 41.3 38.6* 39.7 41.4  MCV 87.7 87.1 87.1 86.7 85.0  PLT 183 183 153 160 [id number]   Basic Metabolic Panel: Recent Labs  Lab 05/13/22 1619 05/13/22 2026 05/14/22 0630  NA 131*  --  136  K 3.9  --  3.7  CL 102  --  108   CO2 21*  --  22  GLUCOSE 162*  --  100*  BUN 36*  --  31*  CREATININE 1.72* 1.77* 1.60*  CALCIUM 8.5*  --  8.1*   Liver Function Tests: Recent Labs  Lab 05/13/22 1619  AST 20  ALT 19  ALKPHOS 65  BILITOT 0.9  PROT 6.4*  ALBUMIN 3.4*   CBG: Recent Labs  Lab 05/16/22 1129 05/16/22 1615 05/16/22 2111 05/17/22 0735 05/17/22 1237  GLUCAP 272* 226* 196* 178* 214*    Discharge time spent: greater than 30 minutes.  Signed: Oran Rein, MD [facility] 05/17/2022

## 2022-05-17 NOTE — TOC Transition Note (Signed)
Transition of Care [facility]) - CM/SW Discharge Note   Patient Details  Name: Richard Chapman MRN: [id number] Date of Birth: 1941/01/03  [facility] CM/SW Contact:  Gerilyn Pilgrim, LCSW Phone Number: 05/17/2022, 10:56 AM   Clinical Narrative:   Pt discharging today with [facility] PT/OT/AID Corene Cornea with [place] notified.  Rollator ordered to be delivered to patients room prior to discharge through [place]. No additional needs at this time. CSW signing off.           Patient Goals and CMS Choice      Discharge Placement                         Discharge Plan and Services Additional resources added to the After Visit Summary for                                       Social Determinants of Health (SDOH) Interventions SDOH Screenings   Food Insecurity: No Food Insecurity (05/13/2022)  Housing: Low Risk  (05/13/2022)  Transportation Needs: No Transportation Needs (05/13/2022)  Utilities: Not At Risk (05/13/2022)  Tobacco Use: High Risk (05/13/2022)     Readmission Risk Interventions     No data to display

## 2022-05-18 LAB — CULTURE, BLOOD (ROUTINE X 2)
Culture: NO GROWTH
Culture: NO GROWTH
Special Requests: ADEQUATE
Special Requests: ADEQUATE

## 2022-09-15 ENCOUNTER — Telehealth: Payer: Self-pay | Admitting: Urology

## 2022-09-15 ENCOUNTER — Other Ambulatory Visit
Admission: RE | Admit: 2022-09-15 | Discharge: 2022-09-15 | Disposition: A | Discharge status: Home / Self Care | Payer: Medicare Other | Attending: Urology | Admitting: Urology

## 2022-09-15 DIAGNOSIS — C61 Malignant neoplasm of prostate: Secondary | ICD-10-CM | POA: Diagnosis present

## 2022-09-15 LAB — PSA: Prostatic Specific Antigen: 33.51 ng/mL — ABNORMAL HIGH (ref 0.00–4.00)

## 2022-09-15 NOTE — Telephone Encounter (Signed)
Order placed

## 2022-09-15 NOTE — Telephone Encounter (Signed)
Pt needs to have PSA done in [place] prior to appt w/Dr Apolinar Junes 8/23.  Can you please put order in for [place] lab?

## 2022-10-06 ENCOUNTER — Ambulatory Visit (INDEPENDENT_AMBULATORY_CARE_PROVIDER_SITE_OTHER): Payer: Medicare Other | Admitting: Urology

## 2022-10-06 ENCOUNTER — Encounter: Payer: Self-pay | Admitting: Urology

## 2022-10-06 VITALS — BP 123/67 | HR 63 | Ht 68.0 in | Wt 170.0 lb

## 2022-10-06 DIAGNOSIS — C61 Malignant neoplasm of prostate: Secondary | ICD-10-CM

## 2022-10-06 DIAGNOSIS — R350 Frequency of micturition: Secondary | ICD-10-CM | POA: Diagnosis not present

## 2022-10-06 NOTE — Progress Notes (Signed)
Richard Chapman,acting as a scribe for Vanna Scotland, MD.,have documented all relevant documentation on the behalf of Vanna Scotland, MD,as directed by  Vanna Scotland, MD while in the presence of Vanna Scotland, MD.  10/06/2022 1:55 PM   Richard Chapman 04/13/1940 [id number]  Referring provider: Mick Sell, MD [address] [place],  [place] [address]  Chief Complaint  Patient presents with   Prostate Cancer    HPI: 82 year-old male who returns today for a routine annual follow up. He was last seen last year. 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   Elevated PSA to 9.1 dx 09/2014 with Gleason 3+3 in 2 cores involving the right apex, up to 23% of tissue.  Rectal exam was benign. TRUS volume 60 cc.      MRI performed on 02/22/2016 shows no evidence of obvious high-grade lesions.  He does have nodular transition zone.  Prostatic capsule is intact.. He does have some subclinical lymph nodes in the bilateral iliac measuring 6 mm.  Gland size 66 cc.    His most recent PSA on 09/15/2022 was 33.51 which is a significant rise from last year.   Today, he continues to have baseline urgency frequency.   Prostatic Specific Antigen  Latest Ref Rng 0.00 - 4.00 ng/mL  08/19/2018 12.15 (H)   09/03/2019 15.83 (H)   03/26/2020 16.48 (H)   09/29/2020 13.23 (H)   04/01/2021 26.46 (H)   04/20/2021 19.19 (H)   10/03/2021 20.52 (H)   09/15/2022 33.51 (H)     Legend: (H) High   PMH: Past Medical History:  Diagnosis Date   Anxiety and depression    Depression    Diabetes (HCC)    Elevated PSA    GERD (gastroesophageal reflux disease)    History of nephrolithiasis    Hypertension    Skin cancer    Sleep apnea     Surgical History: Past Surgical History:  Procedure Laterality Date   APPENDECTOMY     BACK SURGERY     BURR HOLE FOR SUBDURAL HEMATOMA     2015   KNEE SURGERY Bilateral    NECK SURGERY     SHOULDER SURGERY     TONSILLECTOMY     WRIST  SURGERY      Home Medications:  Allergies as of 10/06/2022       Reactions   Lisinopril Other (See Comments), Cough        Medication List        Accurate as of October 06, 2022  1:55 PM. If you have any questions, ask your nurse or doctor.          STOP taking these medications    azithromycin 250 MG tablet Commonly known as: ZITHROMAX Stopped by: Vanna Scotland   cyanocobalamin 1000 MCG tablet Commonly known as: VITAMIN B12 Stopped by: Vanna Scotland   feeding supplement (GLUCERNA 1.2 CAL) Liqd Stopped by: Vanna Scotland   hyoscyamine 0.375 MG 12 hr tablet Commonly known as: LEVBID Stopped by: Vanna Scotland   primidone 50 MG tablet Commonly known as: MYSOLINE Stopped by: Vanna Scotland   propranolol 40 MG tablet Commonly known as: INDERAL Stopped by: Vanna Scotland   white petrolatum Oint Commonly known as: VASELINE Stopped by: Vanna Scotland       TAKE these medications    acetaminophen 325 MG tablet Commonly known as: TYLENOL Take 2 tablets (650 mg total) every 6 (six) hours as needed by  mouth for mild pain (or Fever >/= 101).   amLODipine 10 MG tablet Commonly known as: NORVASC Take 1 tablet (10 mg total) by mouth daily.   atorvastatin 10 MG tablet Commonly known as: LIPITOR Take 10 mg by mouth daily.   cetirizine 10 MG tablet Commonly known as: ZYRTEC Take 10 mg by mouth daily.   Cholecalciferol 50 MCG (2000 UT) Tabs Take 2,000 Units by mouth daily.   clopidogrel 75 MG tablet Commonly known as: PLAVIX Take 1 tablet (75 mg total) by mouth daily.   Combivent Respimat 20-100 MCG/ACT Aers respimat Generic drug: Ipratropium-Albuterol Inhale 1 puff into the lungs every 6 (six) hours.   docusate sodium 100 MG capsule Commonly known as: COLACE Take 1 capsule (100 mg total) by mouth 2 (two) times daily.   escitalopram 20 MG tablet Commonly known as: LEXAPRO Take 1 tablet (20 mg total) by mouth daily. What changed: when to take  this   esomeprazole 20 MG capsule Commonly known as: NEXIUM Take 1 capsule (20 mg total) by mouth AC breakfast.   famotidine 20 MG tablet Commonly known as: PEPCID Take 1 tablet by mouth at bedtime.   fluticasone 50 MCG/ACT nasal spray Commonly known as: FLONASE Place 1 spray into both nostrils daily.   gabapentin 300 MG capsule Commonly known as: NEURONTIN TAKE ONE CAPSULE BY MOUTH AT BEDTIME FOR NERVE PAIN   glipiZIDE 10 MG tablet Commonly known as: GLUCOTROL Take 10 mg by mouth daily before breakfast.   isosorbide mononitrate 60 MG 24 hr tablet Commonly known as: IMDUR Take 60 mg by mouth 2 (two) times daily. What changed: Another medication with the same name was removed. Continue taking this medication, and follow the directions you see here. Changed by: Vanna Scotland   losartan 100 MG tablet Commonly known as: COZAAR Take 100 mg by mouth daily.   magnesium oxide 400 MG tablet Commonly known as: MAG-OX Take 400 mg by mouth daily.   nitroGLYCERIN 0.4 MG SL tablet Commonly known as: NITROSTAT Place 1 tablet (0.4 mg total) under the tongue every 5 (five) minutes as needed for chest pain. What changed: Another medication with the same name was removed. Continue taking this medication, and follow the directions you see here. Changed by: Vanna Scotland   OneTouch Verio test strip Generic drug: glucose blood SMARTSIG:1 Each Via Meter Daily   tamsulosin 0.4 MG Caps capsule Commonly known as: FLOMAX Take 0.4 mg by mouth daily.   traZODone 100 MG tablet Commonly known as: DESYREL Take 1 tablet (100 mg total) by mouth at bedtime.   Evaristo Bury FlexTouch 200 UNIT/ML FlexTouch Pen Generic drug: insulin degludec Inject 16 Units into the skin daily.        Allergies:  Allergies  Allergen Reactions   Lisinopril Other (See Comments) and Cough    Family History: Family History  Problem Relation Age of Onset   Heart disease Mother    Heart disease Father     Diabetes Father    Stroke Father    Kidney disease Neg Hx    Prostate cancer Neg Hx     Social History:  reports that he has been smoking cigarettes. He has never used smokeless tobacco. He reports that he does not currently use alcohol after a past usage of about 2.0 standard drinks of alcohol per week. He reports that he does not use drugs.   Physical Exam: BP 123/67 (BP Location: Left Arm, Patient Position: Sitting, Cuff Size: Normal)   Pulse 63  Ht 5\' 8"  (1.727 m)   Wt 170 lb (77.1 kg)   BMI 25.85 kg/m   Constitutional:  Alert and oriented, No acute distress. He is in a wheelchair. He is here with his wife.  HEENT: [place] AT, moist mucus membranes.  Trachea midline, no masses. Neurologic: Grossly intact, no focal deficits, moving all 4 extremities. Psychiatric: Normal mood and affect.   Assessment & Plan:    1. Low- risk prostate cancer - Active surveillance - Big bump in his PSA since last year - Given his age and comorbidities, would strongly recommend continued surveillance unless he becomes increasingly symptomatic - PSA doubling time is still over a year - We had a lengthy discussion today about any intervention in light of rising PSA versus additional staging imaging. Both he and his wife agree that if he is not having pain or change in his symptoms that they would not want to pursue any treatment including palliative ADT at least at this point in time and as such I will defer PET scan. We will continue to trend his PSA. If he develops a rapid doubling time or focal symptoms, we can treat as needed  2. Urinary frequency - Overall bother is minimal, no intervention  Return in about 1 year (around 10/06/2023) for repeat PSA.  I have reviewed the above documentation for accuracy and completeness, and I agree with the above.   Vanna Scotland, MD [facility] [address] [place], [place] [address] [phone]

## 2023-05-30 ENCOUNTER — Encounter: Payer: Self-pay | Admitting: Urology

## 2023-08-23 ENCOUNTER — Ambulatory Visit
Admission: RE | Admit: 2023-08-23 | Discharge: 2023-08-23 | Disposition: A | Discharge status: Home / Self Care | Source: Ambulatory Visit | Attending: Family Medicine | Admitting: Family Medicine

## 2023-08-23 ENCOUNTER — Other Ambulatory Visit: Payer: Self-pay | Admitting: Family Medicine

## 2023-08-23 DIAGNOSIS — M7989 Other specified soft tissue disorders: Secondary | ICD-10-CM | POA: Insufficient documentation

## 2023-09-18 ENCOUNTER — Telehealth: Payer: Self-pay

## 2023-09-18 DIAGNOSIS — C61 Malignant neoplasm of prostate: Secondary | ICD-10-CM

## 2023-09-18 NOTE — Telephone Encounter (Signed)
 Patients wife called to see if he needs labs drawn before his appointment on 8/11 for his PSA. I returned patients call and let them know that the order for the PSA lab is in for [place] and they can go there before the appt to have it done.

## 2023-09-20 ENCOUNTER — Other Ambulatory Visit
Admission: RE | Admit: 2023-09-20 | Discharge: 2023-09-20 | Disposition: A | Discharge status: Home / Self Care | Attending: Urology | Admitting: Urology

## 2023-09-20 DIAGNOSIS — C61 Malignant neoplasm of prostate: Secondary | ICD-10-CM | POA: Diagnosis present

## 2023-09-20 LAB — PSA: Prostatic Specific Antigen: 43.57 ng/mL — ABNORMAL HIGH (ref 0.00–4.00)

## 2023-09-23 NOTE — Progress Notes (Unsigned)
 09/24/2023 5:46 PM   Richard Chapman [birth year] [id number]  Referring provider: Epifanio Alm SQUIBB, MD [address] [place],  [place] [address]  Urological history: 1. Prostate cancer - PSA (09/2023) 43.57 - Elevated PSA to 9.1 dx 09/2014 with Gleason 3+3 in 2 cores involving the right apex, up to 23% of tissue.  Rectal exam was benign. TRUS volume 60 cc.    - MRI performed on 02/22/2016 shows no evidence of obvious high-grade lesions.  He does have nodular transition zone.  Prostatic capsule is intact.  He does have some subclinical lymph nodes in the bilateral iliac measuring 6 mm.  Gland size 66 cc.    2. BPH with LU TS - continue tamsulosin  0.4 mg daily  No chief complaint on file.  HPI: Richard Chapman is a 83 y.o. man who presents today for biannual follow up.  Previous records reviewed.   Hbg A1c (09/2023) 7.4  Serum creatinine (04/2023) 2.1, eGFR 30  PMH: Past Medical History:  Diagnosis Date   Anxiety and depression    Depression    Diabetes (HCC)    Elevated PSA    GERD (gastroesophageal reflux disease)    History of nephrolithiasis    Hypertension    Skin cancer    Sleep apnea     Surgical History: Past Surgical History:  Procedure Laterality Date   APPENDECTOMY     BACK SURGERY     BURR HOLE FOR SUBDURAL HEMATOMA     2015   KNEE SURGERY Bilateral    NECK SURGERY     SHOULDER SURGERY     TONSILLECTOMY     WRIST SURGERY      Home Medications:  Allergies as of 09/24/2023       Reactions   Lisinopril Other (See Comments), Cough        Medication List        Accurate as of September 23, 2023  5:46 PM. If you have any questions, ask your nurse or doctor.          acetaminophen  325 MG tablet Commonly known as: TYLENOL  Take 2 tablets (650 mg total) every 6 (six) hours as needed by mouth for mild pain (or Fever >/= 101).   amLODipine  10 MG tablet Commonly known as: NORVASC  Take 1 tablet (10 mg total) by mouth daily.    atorvastatin  10 MG tablet Commonly known as: LIPITOR Take 10 mg by mouth daily.   cetirizine 10 MG tablet Commonly known as: ZYRTEC Take 10 mg by mouth daily.   Cholecalciferol  50 MCG (2000 UT) Tabs Take 2,000 Units by mouth daily.   clopidogrel  75 MG tablet Commonly known as: PLAVIX  Take 1 tablet (75 mg total) by mouth daily.   Combivent  Respimat 20-100 MCG/ACT Aers respimat Generic drug: Ipratropium-Albuterol  Inhale 1 puff into the lungs every 6 (six) hours.   docusate sodium  100 MG capsule Commonly known as: COLACE Take 1 capsule (100 mg total) by mouth 2 (two) times daily.   escitalopram  20 MG tablet Commonly known as: LEXAPRO  Take 1 tablet (20 mg total) by mouth daily. What changed: when to take this   esomeprazole  20 MG capsule Commonly known as: NEXIUM  Take 1 capsule (20 mg total) by mouth AC breakfast.   famotidine  20 MG tablet Commonly known as: PEPCID  Take 1 tablet by mouth at bedtime.   fluticasone 50 MCG/ACT nasal spray Commonly known as: FLONASE Place 1 spray into both nostrils daily.   gabapentin  300 MG capsule  Commonly known as: NEURONTIN  TAKE ONE CAPSULE BY MOUTH AT BEDTIME FOR NERVE PAIN   glipiZIDE 10 MG tablet Commonly known as: GLUCOTROL Take 10 mg by mouth daily before breakfast.   isosorbide  mononitrate 60 MG 24 hr tablet Commonly known as: IMDUR  Take 60 mg by mouth 2 (two) times daily.   losartan  100 MG tablet Commonly known as: COZAAR  Take 100 mg by mouth daily.   magnesium  oxide 400 MG tablet Commonly known as: MAG-OX Take 400 mg by mouth daily.   nitroGLYCERIN  0.4 MG SL tablet Commonly known as: NITROSTAT  Place 1 tablet (0.4 mg total) under the tongue every 5 (five) minutes as needed for chest pain.   OneTouch Verio test strip Generic drug: glucose blood SMARTSIG:1 Each Via Meter Daily   tamsulosin  0.4 MG Caps capsule Commonly known as: FLOMAX  Take 0.4 mg by mouth daily.   traZODone  100 MG tablet Commonly known as:  DESYREL  Take 1 tablet (100 mg total) by mouth at bedtime.   Tresiba FlexTouch 200 UNIT/ML FlexTouch Pen Generic drug: insulin  degludec Inject 16 Units into the skin daily.        Allergies:  Allergies  Allergen Reactions   Lisinopril Other (See Comments) and Cough    Family History: Family History  Problem Relation Age of Onset   Heart disease Mother    Heart disease Father    Diabetes Father    Stroke Father    Kidney disease Neg Hx    Prostate cancer Neg Hx     Social History: See HPI for pertinent social history  ROS: Pertinent ROS in HPI  Physical Exam: There were no vitals taken for this visit.  Constitutional:  Well nourished. Alert and oriented, No acute distress. HEENT: [place] AT, moist mucus membranes.  Trachea midline, no masses. Cardiovascular: No clubbing, cyanosis, or edema. Respiratory: Normal respiratory effort, no increased work of breathing. GI: Abdomen is soft, non tender, non distended, no abdominal masses. Liver and spleen not palpable.  No hernias appreciated.  Stool sample for occult testing is not indicated.   GU: No CVA tenderness.  No bladder fullness or masses.  Patient with circumcised/uncircumcised phallus. ***Foreskin easily retracted***  Urethral meatus is patent.  No penile discharge. No penile lesions or rashes. Scrotum without lesions, cysts, rashes and/or edema.  Testicles are located scrotally bilaterally. No masses are appreciated in the testicles. Left and right epididymis are normal. Rectal: Patient with  normal sphincter tone. Anus and perineum without scarring or rashes. No rectal masses are appreciated. Prostate is approximately *** grams, *** nodules are appreciated. Seminal vesicles are normal. Skin: No rashes, bruises or suspicious lesions. Lymph: No cervical or inguinal adenopathy. Neurologic: Grossly intact, no focal deficits, moving all 4 extremities. Psychiatric: Normal mood and affect.  Laboratory Data: See EPIC and HPI  I  have reviewed the labs.   Pertinent Imaging: N/A  Assessment & Plan:  ***  1. Prostate cancer - PSA is continuing to increase but doubling time is just over one year  2. BPH with LU TS - continue tamsulosin  0.4 mg daily  No follow-ups on file.  These notes generated with voice recognition software. I apologize for typographical errors.  CLOTILDA HELON RIGGERS  [facility] [address]  [address] [place], [place] [address] [phone]

## 2023-09-24 ENCOUNTER — Encounter: Payer: Self-pay | Admitting: Urology

## 2023-09-24 ENCOUNTER — Ambulatory Visit (INDEPENDENT_AMBULATORY_CARE_PROVIDER_SITE_OTHER): Admitting: Urology

## 2023-09-24 VITALS — BP 126/67 | HR 57 | Wt 170.0 lb

## 2023-09-24 DIAGNOSIS — N138 Other obstructive and reflux uropathy: Secondary | ICD-10-CM

## 2023-09-24 DIAGNOSIS — C61 Malignant neoplasm of prostate: Secondary | ICD-10-CM | POA: Diagnosis not present

## 2023-09-24 DIAGNOSIS — N401 Enlarged prostate with lower urinary tract symptoms: Secondary | ICD-10-CM

## 2023-09-28 ENCOUNTER — Ambulatory Visit: Payer: Self-pay | Admitting: Urology

## 2024-09-29 ENCOUNTER — Ambulatory Visit: Admitting: Urology
